# Patient Record
Sex: Male | Born: 1960 | Race: White | Hispanic: No | Marital: Married | Smoking: Current every day smoker
Health system: Southern US, Community
[De-identification: ages and names within clinical notes are randomized; demographics above are authoritative.]

## PROBLEM LIST (undated history)

## (undated) DIAGNOSIS — Z8739 Personal history of other diseases of the musculoskeletal system and connective tissue: Secondary | ICD-10-CM

## (undated) DIAGNOSIS — C801 Malignant (primary) neoplasm, unspecified: Secondary | ICD-10-CM

## (undated) DIAGNOSIS — T4145XA Adverse effect of unspecified anesthetic, initial encounter: Secondary | ICD-10-CM

## (undated) DIAGNOSIS — K219 Gastro-esophageal reflux disease without esophagitis: Secondary | ICD-10-CM

## (undated) DIAGNOSIS — F419 Anxiety disorder, unspecified: Secondary | ICD-10-CM

## (undated) DIAGNOSIS — R1011 Right upper quadrant pain: Secondary | ICD-10-CM

## (undated) DIAGNOSIS — E785 Hyperlipidemia, unspecified: Secondary | ICD-10-CM

## (undated) DIAGNOSIS — T8859XA Other complications of anesthesia, initial encounter: Secondary | ICD-10-CM

## (undated) DIAGNOSIS — J449 Chronic obstructive pulmonary disease, unspecified: Secondary | ICD-10-CM

## (undated) DIAGNOSIS — I10 Essential (primary) hypertension: Secondary | ICD-10-CM

## (undated) DIAGNOSIS — M1990 Unspecified osteoarthritis, unspecified site: Secondary | ICD-10-CM

## (undated) DIAGNOSIS — F32A Depression, unspecified: Secondary | ICD-10-CM

## (undated) DIAGNOSIS — F329 Major depressive disorder, single episode, unspecified: Secondary | ICD-10-CM

## (undated) HISTORY — DX: Essential (primary) hypertension: I10

## (undated) HISTORY — DX: Unspecified osteoarthritis, unspecified site: M19.90

## (undated) HISTORY — PX: ELBOW SURGERY: SHX618

## (undated) HISTORY — PX: EYE SURGERY: SHX253

## (undated) HISTORY — DX: Hyperlipidemia, unspecified: E78.5

## (undated) HISTORY — DX: Malignant (primary) neoplasm, unspecified: C80.1

## (undated) HISTORY — PX: HERNIA REPAIR: SHX51

---

## 1998-07-03 HISTORY — PX: NASAL SINUS SURGERY: SHX719

## 2003-05-18 ENCOUNTER — Encounter: Admission: RE | Admit: 2003-05-18 | Discharge: 2003-05-18 | Payer: Self-pay | Admitting: Internal Medicine

## 2003-06-02 ENCOUNTER — Ambulatory Visit (HOSPITAL_COMMUNITY): Admission: RE | Admit: 2003-06-02 | Discharge: 2003-06-02 | Payer: Self-pay | Admitting: Specialist

## 2003-08-27 ENCOUNTER — Encounter: Admission: RE | Admit: 2003-08-27 | Discharge: 2003-08-27 | Payer: Self-pay | Admitting: Internal Medicine

## 2004-04-05 ENCOUNTER — Ambulatory Visit (HOSPITAL_COMMUNITY): Admission: RE | Admit: 2004-04-05 | Discharge: 2004-04-05 | Payer: Self-pay | Admitting: *Deleted

## 2004-04-05 ENCOUNTER — Encounter (INDEPENDENT_AMBULATORY_CARE_PROVIDER_SITE_OTHER): Payer: Self-pay | Admitting: *Deleted

## 2004-06-21 ENCOUNTER — Encounter: Admission: RE | Admit: 2004-06-21 | Discharge: 2004-06-21 | Payer: Self-pay | Admitting: Internal Medicine

## 2005-01-02 ENCOUNTER — Encounter: Admission: RE | Admit: 2005-01-02 | Discharge: 2005-01-02 | Payer: Self-pay | Admitting: Internal Medicine

## 2006-09-10 ENCOUNTER — Encounter: Admission: RE | Admit: 2006-09-10 | Discharge: 2006-09-10 | Payer: Self-pay | Admitting: Neurology

## 2007-03-01 ENCOUNTER — Ambulatory Visit (HOSPITAL_COMMUNITY): Admission: RE | Admit: 2007-03-01 | Discharge: 2007-03-01 | Payer: Self-pay | Admitting: *Deleted

## 2007-08-19 ENCOUNTER — Encounter (INDEPENDENT_AMBULATORY_CARE_PROVIDER_SITE_OTHER): Payer: Self-pay | Admitting: *Deleted

## 2007-08-19 ENCOUNTER — Ambulatory Visit (HOSPITAL_COMMUNITY): Admission: RE | Admit: 2007-08-19 | Discharge: 2007-08-19 | Payer: Self-pay | Admitting: *Deleted

## 2010-03-23 ENCOUNTER — Ambulatory Visit (HOSPITAL_COMMUNITY): Admission: RE | Admit: 2010-03-23 | Discharge: 2010-03-23 | Payer: Self-pay | Admitting: General Surgery

## 2010-04-14 ENCOUNTER — Encounter: Admission: RE | Admit: 2010-04-14 | Discharge: 2010-04-14 | Payer: Self-pay | Admitting: General Surgery

## 2010-07-03 HISTORY — PX: SHOULDER SURGERY: SHX246

## 2010-09-15 LAB — COMPREHENSIVE METABOLIC PANEL
ALT: 38 U/L (ref 0–53)
AST: 28 U/L (ref 0–37)
Albumin: 4.4 g/dL (ref 3.5–5.2)
Alkaline Phosphatase: 48 U/L (ref 39–117)
BUN: 9 mg/dL (ref 6–23)
CO2: 27 mEq/L (ref 19–32)
Calcium: 9.6 mg/dL (ref 8.4–10.5)
Chloride: 105 mEq/L (ref 96–112)
Creatinine, Ser: 0.91 mg/dL (ref 0.4–1.5)
GFR calc Af Amer: >60 mL/min (ref >60)
GFR calc non Af Amer: >60 mL/min (ref >60)
Glucose, Bld: 134 mg/dL — ABNORMAL HIGH (ref 70–99)
Potassium: 4.5 mEq/L (ref 3.5–5.1)
Sodium: 140 mEq/L (ref 135–145)
Total Bilirubin: 0.4 mg/dL (ref 0.3–1.2)
Total Protein: 7.3 g/dL (ref 6.0–8.3)

## 2010-09-15 LAB — GLUCOSE, CAPILLARY
Glucose-Capillary: 156 mg/dL — ABNORMAL HIGH (ref 70–99)
Glucose-Capillary: 162 mg/dL — ABNORMAL HIGH (ref 70–99)

## 2010-09-15 LAB — SURGICAL PCR SCREEN
MRSA, PCR: NEGATIVE
Staphylococcus aureus: NEGATIVE

## 2010-11-15 NOTE — Op Note (Signed)
NAME:  Frank Baird, Frank Baird NO.:  [id number]   MEDICAL RECORD NO.:  [id number]          PATIENT TYPE:  AMB   LOCATION:  ENDO                         FACILITY:  [facility]   PHYSICIAN:  Georgiana Spinner, M.D.    DATE OF BIRTH:  Dec 07, 1960   DATE OF PROCEDURE:  03/01/2007  DATE OF DISCHARGE:                               OPERATIVE REPORT   ADDENDUM TO ENDOSCOPY:  A carbon copy to Dr. Rudi Heap in [facility].  Do not make this a separate dictation.  Just tack it to the end  of the endoscopy as if it were a seamless carbon copy.  Thank you.           ______________________________  Georgiana Spinner, M.D.     GMO/MEDQ  D:  03/01/2007  T:  03/02/2007  Job:  [id number]

## 2010-11-15 NOTE — Op Note (Signed)
NAME:  Frank Baird, Frank Baird NO.:  [id number]   MEDICAL RECORD NO.:  [id number]          PATIENT TYPE:  AMB   LOCATION:  ENDO                         FACILITY:  [facility]   PHYSICIAN:  Georgiana Spinner, M.D.    DATE OF BIRTH:  [birth year]   DATE OF PROCEDURE:  08/19/2007  DATE OF DISCHARGE:                               OPERATIVE REPORT   PROCEDURE:  Colonoscopy.   INDICATIONS:  Colon polyps.   ANESTHESIA:  Fentanyl 95 mcg, Versed 9.5 mg.   PROCEDURE:  With the patient mildly sedated in the left lateral  decubitus position, the rectal examination was attempted by me.  External hemorrhoid was noted.  Prostate felt normal to my limited exam.  Subsequently the Pentax videoscopic colonoscope was inserted in the  rectum, passed under direct vision to the cecum, identified by ileocecal  valve and appendiceal orifice both which were photographed.  From this  point the colonoscope was slowly withdrawn taking circumferential views  of colonic mucosa stopping at approximately 20 cm from anal verge at  which point a polyp was seen and photographed and removed using snare  cautery technique setting of 20/150 blended current. Polyp was were  retrieved by suctioning through the endoscope into a tissue trap.  The  endoscope was withdrawn to the rectum which appeared normal on direct  and retroflexed view.  The endoscope was straightened and withdrawn.  The patient's vital signs, pulse oximeter remained stable.  The patient  tolerated procedure well without apparent complication.   FINDINGS:  Small polyp at 20 cm from anal verge, otherwise unremarkable  examination.   PLAN:  Await biopsy report.  The patient will call me for results and  follow-up with me as needed as an outpatient.           ______________________________  Georgiana Spinner, M.D.     GMO/MEDQ  D:  08/19/2007  T:  08/20/2007  Job:  [id number]

## 2010-11-15 NOTE — Op Note (Signed)
NAME:  Frank Baird, Frank Baird NO.:  [id number]   MEDICAL RECORD NO.:  [id number]          PATIENT TYPE:  AMB   LOCATION:  ENDO                         FACILITY:  [facility]   PHYSICIAN:  Georgiana Spinner, M.D.    DATE OF BIRTH:  [birth year]   DATE OF PROCEDURE:  03/01/2007  DATE OF DISCHARGE:                               OPERATIVE REPORT   PROCEDURE:  Upper endoscopy.   INDICATIONS:  GERD.   ANESTHESIA:  Fentanyl 75 mcg, Versed 7 mg.   PROCEDURE:  With the patient mildly sedated in the left lateral  decubitus position, the Pentax videoscopic endoscope was inserted in the  mouth, passed under direct vision through the esophagus, which appeared,  normal into the stomach, which appeared normal, fundus, body, antrum.  Duodenal bulb was entered and it showed some changes of duodenitis,  fairly mild.  The second portion duodenum appeared normal.  From this  point the endoscope was slowly withdrawn taking circumferential views of  duodenal mucosa until the endoscope was then pulled back into the  stomach, placed in retroflexion to view the stomach from below.  The  endoscope was straightened and withdrawn taking circumferential views  remaining gastric and esophageal mucosa.  The patient's vital signs and  pulse oximetry remained stable.  The patient tolerated the procedure  well without apparent complications.   FINDINGS:  Mild duodenitis, otherwise an unremarkable examination.  No  evidence of Barrett's esophagus or esophagitis noted.   PLAN:  Have patient follow up with me on an as-needed basis.           ______________________________  Georgiana Spinner, M.D.     GMO/MEDQ  D:  03/01/2007  T:  03/02/2007  Job:  [id number]

## 2010-11-18 NOTE — Op Note (Signed)
NAME:  Frank Baird, Frank Baird NO.:  [id number]   MEDICAL RECORD NO.:  [id number]          PATIENT TYPE:  AMB   LOCATION:  ENDO                         FACILITY:  [facility]   PHYSICIAN:  Georgiana Spinner, M.D.    DATE OF BIRTH:  09/08/1960   DATE OF PROCEDURE:  DATE OF DISCHARGE:                                 OPERATIVE REPORT   PROCEDURE:  Colonoscopy.   SURGEON:   INDICATIONS FOR PROCEDURE:  Rectal bleeding.   ANESTHESIA:  Demerol 25 and Versed 2.5 mg.   DESCRIPTION OF PROCEDURE:  With the patient mildly sedated in the left  lateral decubitus position, the Olympus videoscopic colonoscope was inserted  in the rectum after a normal rectal examination, passed under direct vision  to the cecum identified by ileocecal valve and appendiceal orifice both of  which were photographed.  We entered into the terminal ileum which appeared  normal and was photographed.  From this point, the colonoscope was slowly  withdrawn taking circumferential views of the colonic mucosa stopping only  in the descending colon where polyp was seen, photographed and removed using  hot biopsy forceps technique at setting of 20/200 amount of current.  The  endoscope was withdrawn all the way to the rectum which appeared normal on  direct and retroflexed views showed hemorrhoids.  The endoscope was  withdrawn.  The patient's vital signs and pulse oximeter remained stable.  The patient tolerated the procedure well with no apparent complications.   FINDINGS:  Polyp in descending colon biopsied.  Await biopsy report.  The  patient will call me for results.  Follow-up will be as an outpatient to  determine ____________.       Rochele Raring  D:  04/05/2004  T:  04/05/2004  Job:  [id number]

## 2010-11-18 NOTE — Op Note (Signed)
NAME:  Frank Baird, Frank Baird NO.:  [id number]   MEDICAL RECORD NO.:  [id number]          PATIENT TYPE:  AMB   LOCATION:  ENDO                         FACILITY:  [facility]   PHYSICIAN:  Georgiana Spinner, M.D.    DATE OF BIRTH:  07-22-1960   DATE OF PROCEDURE:  DATE OF DISCHARGE:                                 OPERATIVE REPORT   PROCEDURE:  Upper endoscopy with biopsy.   SURGEON:   INDICATIONS FOR PROCEDURE:  GERD, rectal bleeding.   ANESTHESIA:  Demerol 75 mg and Versed 7.5 mg.   DESCRIPTION OF PROCEDURE:  With the patient mildly sedated in the left  lateral decubitus position, the Olympus videoscopic endoscope was inserted  in the mouth and passed under direct vision through the esophagus which  appeared normal until I reached distal esophagus and there was change of the  esophagitis with ulceration and erythema seen and there was also an area of  Barrett's esophagus photographed.  We then biopsied these areas as best as  possible via a lot of spasm in this area.  Therefore, we had difficulty  seeing the full attempt and he was refluxing bilious material up into the  esophagus but we biopsied what we could.  Advanced into the stomach.  Fundus, body, antrum, duodenal bulb and second portion of duodenum were  visualized.  From this point the endoscope was slowly withdrawn, taking  circumferential views of the left mucosa until the endoscope had been pulled  back into the stomach, placed in retroflexion to view the stomach from  below.  The endoscope was straightened and withdrawn taking circumferential  views of the remaining gastric and esophageal mucosa.  stopping at the  fundus and stomach where changes of erythema were seen and biopsied.  The  patient's vital signs and pulse oximeter remained stable.  The patient  tolerated the procedure well with no apparent complications.   FINDINGS:  Barrett's esophagus and esophagitis photographed and biopsied.  Changes in the  fundus probably H. pyloris gastritis, biopsied.   PLAN:  Await biopsy report.  The patient will call me for results of biopsy.  The patient to proceed to colonoscopy as planned.       GMO/MEDQ  D:  04/05/2004  T:  04/05/2004  Job:  [id number]

## 2010-11-24 ENCOUNTER — Other Ambulatory Visit: Payer: Self-pay | Admitting: Specialist

## 2010-11-24 ENCOUNTER — Encounter (HOSPITAL_COMMUNITY): Payer: 59

## 2010-11-24 LAB — BASIC METABOLIC PANEL
BUN: 11 mg/dL (ref 6–23)
CO2: 25 mEq/L (ref 19–32)
Calcium: 9.4 mg/dL (ref 8.4–10.5)
Chloride: 102 mEq/L (ref 96–112)
Creatinine, Ser: 0.78 mg/dL (ref 0.4–1.5)
GFR calc Af Amer: >60 mL/min (ref >60)
GFR calc non Af Amer: >60 mL/min (ref >60)
Glucose, Bld: 130 mg/dL — ABNORMAL HIGH (ref 70–99)
Potassium: 4 mEq/L (ref 3.5–5.1)
Sodium: 137 mEq/L (ref 135–145)

## 2010-11-24 LAB — CBC
HCT: 42.1 % (ref 39.0–52.0)
Hemoglobin: 14.1 g/dL (ref 13.0–17.0)
MCH: 29.7 pg (ref 26.0–34.0)
MCHC: 33.5 g/dL (ref 30.0–36.0)
MCV: 88.8 fL (ref 78.0–100.0)
Platelets: 187 10*3/uL (ref 150–400)
RBC: 4.74 MIL/uL (ref 4.22–5.81)
RDW: 13.8 % (ref 11.5–15.5)
WBC: 8.7 10*3/uL (ref 4.0–10.5)

## 2010-11-24 LAB — SURGICAL PCR SCREEN
MRSA, PCR: NEGATIVE
Staphylococcus aureus: NEGATIVE

## 2010-12-01 ENCOUNTER — Other Ambulatory Visit: Payer: Self-pay | Admitting: Specialist

## 2010-12-01 ENCOUNTER — Ambulatory Visit (HOSPITAL_COMMUNITY)
Admission: RE | Admit: 2010-12-01 | Discharge: 2010-12-01 | Disposition: A | Discharge status: Home / Self Care | Payer: 59 | Source: Ambulatory Visit | Attending: Specialist | Admitting: Specialist

## 2010-12-01 DIAGNOSIS — M719 Bursopathy, unspecified: Secondary | ICD-10-CM | POA: Insufficient documentation

## 2010-12-01 DIAGNOSIS — M67919 Unspecified disorder of synovium and tendon, unspecified shoulder: Secondary | ICD-10-CM | POA: Insufficient documentation

## 2010-12-01 DIAGNOSIS — Z01812 Encounter for preprocedural laboratory examination: Secondary | ICD-10-CM | POA: Insufficient documentation

## 2010-12-01 DIAGNOSIS — M24119 Other articular cartilage disorders, unspecified shoulder: Secondary | ICD-10-CM | POA: Insufficient documentation

## 2010-12-01 DIAGNOSIS — Z79899 Other long term (current) drug therapy: Secondary | ICD-10-CM | POA: Insufficient documentation

## 2010-12-01 DIAGNOSIS — M19019 Primary osteoarthritis, unspecified shoulder: Secondary | ICD-10-CM | POA: Insufficient documentation

## 2010-12-01 DIAGNOSIS — M25819 Other specified joint disorders, unspecified shoulder: Secondary | ICD-10-CM | POA: Insufficient documentation

## 2010-12-01 LAB — GLUCOSE, CAPILLARY
Glucose-Capillary: 163 mg/dL — ABNORMAL HIGH (ref 70–99)
Glucose-Capillary: 197 mg/dL — ABNORMAL HIGH (ref 70–99)
Glucose-Capillary: 157 mg/dL — ABNORMAL HIGH (ref 70–99)

## 2010-12-19 NOTE — Op Note (Signed)
NAME:  Frank Baird, Frank Baird               ACCOUNT NO.:  [id number]  MEDICAL RECORD NO.:  [id number]           PATIENT TYPE:  O  LOCATION:  DAYL                         FACILITY:  [facility]  PHYSICIAN:  Jene Every, M.D.    DATE OF BIRTH:  [birth year]  DATE OF PROCEDURE:  12/01/2010 DATE OF DISCHARGE:                              OPERATIVE REPORT   PREOPERATIVE DIAGNOSES:  Impingement syndrome, labral tear, rotator cuff tear, right shoulder.  POSTOPERATIVE DIAGNOSES: 1. Posterior, superior, anterior labral tear. 2. Impingement syndrome. 3. Small tear of the rotator cuff. 4. Acromioclavicular arthrosis.  PROCEDURE PERFORMED: 1. Exam under anesthesia. 2. Right shoulder arthroscopy with debridement of the anterior,     superior and posterior labrum. 3. Debridement of rotator cuff. 4. Subacromial decompression, acromioplasty, bursectomy. 5. Mini open distal clavicle resection.  ANESTHESIA:  General.  ASSISTANT:  Roma Schanz, P.A.BRIEF HISTORY:  50 year old with shoulder pain, refractory.  MRI indicated remnant tear of the rotator cuff, labral tear and moderate AC arthrosis.  He was exquisitely tender over the [facility], had a positive crossover maneuver.  Also had impingement sign of the shoulder.  He is here for shoulder arthroscopy, evaluation of the rotator cuff, distal clavicle resection, possible open rotator cuff repair.  Risks and benefits discussed, including bleeding, infection, suboptimal range of motion, recurrent tear, persistent pain, need for revision, DVT, PE, anesthetic complications, etc.  TECHNIQUE:  The patient in supine beach-chair position after induction of adequate general anesthesia and 1g of vancomycin.  The right shoulder and upper extremity was prepped and draped in usual sterile fashion. Examined him under anesthesia and found to have normal range of motion. Surgical marker was utilized only at acromion,  AC joint and coracoid. Posterolateral and  anterolateral incisions were made through the skin only with the arm in 70/30 position with gentle traction.  We did insert arthroscopic camera into the glenohumeral joint, penetrating the capsule atraumatically.  There was extensive fraying and tearing of the posterior labrum into the superior labrum and a portion of the anterior labrum.  Anteriorly, there was a Buford type complex.  There was some fraying of supraspinatus, minimal fraying of the undersurface of the supraspinatus rotator cuff.  Under direct visualization, I guided an 18- gauge needle into the capsule anteriorly just beneath the biceps tendon. A small incision in the skin advanced through cannula beneath the biceps tendon without difficulty.  I then introduced a shaver and debrided the labrum to a stable base both posteriorly, superiorly and anteriorly.  It was still attached and was not a full detachment of the labrum.  Some mild grade 3 changes of the central portion of the glenoid.  We shaved the undersurface of rotator cuff, some fraying of the subscap was noted as well as the small biceps tendon was intact and not subluxing out of the groove with probing.  Next, redirected camera in the subacromial space and moved the anterior portal to the anterolateral portal, triangulating.  Hypertrophic bursa was noted in the subacromial space. We had performed a full bursectomy.  Then, we released CA ligament, morselized that with an ArthroWand.  A small spur  on the anterior and the anterolateral aspect acromion was removed as well performing acromioplasty.  Next, attention turned towards the rotator cuff.  Full inspection revealed no evidence of tearing and was well vascularized. Hyperemic beneath the anterolateral aspect of the acromion with acromioplasty was performed.  An extensive probing revealed no significant tearing, perhaps in concert with the undersurface of 50% to 15%.  Next, we turned attention towards the clavicle.   We skeletonized the distal centimeter of the clavicle, removing the capsule with the ArthroWand both inferiorly, anteriorly and posteriorly.  Unable to deliver the clavicle into the subacromial space, however.  Therefore, converted to a mini open procedure.  All instrumentation was removed and the portals were closed with 4-0 nylon simple sutures.  Small 2-cm incision was made over the clavicle.  Subcutaneous tissue was dissected. Electrocautery was utilized to achieve hemostasis.  We split the deltotrapezial fascia as well as the capsule.  Both were preserved, again skeletonizing the distal clavicle approximately 1 cm of that.  We protected the rotator cuff inferiorly, anteriorly and posteriorly with Homan's  using an oscillating saw to remove a centimeter of the distal clavicle.  We then undercut the clavicle with 3-mm Kerrison to remove any inferiorly projecting spurs on the rotator cuff.  Following this, there was excellent space between the clavicle and the acromion.  The rotator cuff was unremarkable.  The coracoclavicular ligaments were preserved.  The __________ was then copiously irrigated.  Bone wax was placed over the distal clavicle, repaired the capsule with 0 Vicryl interrupted figure-of-8 sutures as well as the deltotrapezial fascia and subcu tissue with 2-0 Vicryl simple sutures.  Skin was reapproximated with 4-0 subcuticular Prolene, 0.25% Marcaine with epinephrine was infiltrated in the [facility] joint as well as the subacromial space.  Sterile dressing was applied.  He was placed in a sling, extubated without difficulty and transported to the recovery room in satisfactory condition.  The patient tolerated the procedure well.  No complications.  Assistant was [employer].  We had minimal blood loss.     Jene Every, M.D.     Cordelia Pen  D:  12/01/2010  T:  12/01/2010  Job:  [id number]  Electronically Signed by Jene Every M.D. on 12/19/2010 03:39:49 PM

## 2011-08-28 ENCOUNTER — Other Ambulatory Visit: Payer: Self-pay | Admitting: Physician Assistant

## 2011-08-28 ENCOUNTER — Ambulatory Visit
Admission: RE | Admit: 2011-08-28 | Discharge: 2011-08-28 | Disposition: A | Discharge status: Home / Self Care | Payer: Federal, State, Local not specified - PPO | Source: Ambulatory Visit | Attending: Physician Assistant | Admitting: Physician Assistant

## 2011-08-28 DIAGNOSIS — R1011 Right upper quadrant pain: Secondary | ICD-10-CM

## 2011-08-30 ENCOUNTER — Other Ambulatory Visit: Payer: Self-pay | Admitting: Physician Assistant

## 2011-08-30 DIAGNOSIS — R110 Nausea: Secondary | ICD-10-CM

## 2011-08-30 DIAGNOSIS — R1011 Right upper quadrant pain: Secondary | ICD-10-CM

## 2011-09-05 ENCOUNTER — Encounter (HOSPITAL_COMMUNITY): Payer: Self-pay

## 2011-09-05 ENCOUNTER — Encounter (HOSPITAL_COMMUNITY)
Admission: RE | Admit: 2011-09-05 | Discharge: 2011-09-05 | Disposition: A | Discharge status: Home / Self Care | Payer: Federal, State, Local not specified - PPO | Source: Ambulatory Visit | Attending: Physician Assistant | Admitting: Physician Assistant

## 2011-09-05 DIAGNOSIS — R1011 Right upper quadrant pain: Secondary | ICD-10-CM

## 2011-09-05 DIAGNOSIS — R110 Nausea: Secondary | ICD-10-CM | POA: Insufficient documentation

## 2011-09-05 HISTORY — DX: Right upper quadrant pain: R10.11

## 2011-09-05 MED ORDER — SINCALIDE 5 MCG IJ SOLR: 0.0200 ug/kg | Freq: Once | INTRAMUSCULAR | Status: DC

## 2011-09-05 MED ORDER — TECHNETIUM TC 99M MEBROFENIN IV KIT
5.5000 | PACK | Freq: Once | INTRAVENOUS | Status: AC | PRN
  Administered 2011-09-05: 5.5 via INTRAVENOUS

## 2011-09-06 ENCOUNTER — Other Ambulatory Visit: Payer: Self-pay | Admitting: Physician Assistant

## 2011-09-06 ENCOUNTER — Ambulatory Visit
Admission: RE | Admit: 2011-09-06 | Discharge: 2011-09-06 | Disposition: A | Discharge status: Home / Self Care | Payer: Federal, State, Local not specified - PPO | Source: Ambulatory Visit | Attending: Physician Assistant | Admitting: Physician Assistant

## 2011-09-06 MED ORDER — IOHEXOL 300 MG/ML  SOLN
125.0000 mL | Freq: Once | INTRAMUSCULAR | Status: AC | PRN
  Administered 2011-09-06: 125 mL via INTRAVENOUS

## 2011-09-06 MED ORDER — IOHEXOL 300 MG/ML  SOLN
20.0000 mL | Freq: Once | INTRAMUSCULAR | Status: AC | PRN
  Administered 2011-09-06: 20 mL via ORAL

## 2011-09-22 ENCOUNTER — Encounter (INDEPENDENT_AMBULATORY_CARE_PROVIDER_SITE_OTHER): Payer: Self-pay

## 2011-09-22 ENCOUNTER — Encounter (HOSPITAL_COMMUNITY): Payer: Self-pay | Admitting: Pharmacy Technician

## 2011-09-22 ENCOUNTER — Ambulatory Visit (INDEPENDENT_AMBULATORY_CARE_PROVIDER_SITE_OTHER): Payer: Federal, State, Local not specified - PPO | Admitting: General Surgery

## 2011-09-22 ENCOUNTER — Encounter (INDEPENDENT_AMBULATORY_CARE_PROVIDER_SITE_OTHER): Payer: Self-pay | Admitting: General Surgery

## 2011-09-22 VITALS — BP 154/86 | HR 64 | Temp 96.8°F | Resp 16 | Ht 69.0 in | Wt 244.5 lb

## 2011-09-22 DIAGNOSIS — R1011 Right upper quadrant pain: Secondary | ICD-10-CM

## 2011-09-22 NOTE — Pre-Procedure Instructions (Addendum)
20 GIRARD KOONTZ  09/22/2011   Your procedure is scheduled on: Tues, Mar 26 @ 1000  Report to [facility] at 0800 AM.  Call this number if you have problems the morning of surgery: [phone]   Remember:   Do not eat food:After Midnight.  May have clear liquids: up to 4 Hours before arrival.(until 4:00 am)  Clear liquids include soda, tea, black coffee, apple or grape juice, broth,water  Take these medicines the morning of surgery with A SIP OF WATER: Nexium and Pain Pill(if needed)   Do not wear jewelry  Do not wear lotions, powders, or perfumes.   Do not bring valuables to the hospital.  Contacts, dentures or bridgework may not be worn into surgery.  Leave suitcase in the car. After surgery it may be brought to your room.  For patients admitted to the hospital, checkout time is 11:00 AM the day of discharge.   Patients discharged the day of surgery will not be allowed to drive home. Driver will be wife, Best boy.   Special Instructions: CHG Shower Use Special Wash: 1/2 bottle night before surgery and 1/2 bottle morning of surgery.   Please read over the following fact sheets that you were given: Pain Booklet, Coughing and Deep Breathing, MRSA Information and Surgical Site Infection Prevention

## 2011-09-22 NOTE — Progress Notes (Signed)
Subjective:   Right upper quadrant abdominal pain  Patient ID: Frank Baird, male   DOB: 09/08/1960, 51 y.o.   MRN: [id number]  HPI The patient comes to the office referred by Dr. Hung for recent onset of significant right upper quadrant abdominal pain. The patient denies any previous history of similar pain. He has been followed for chronic reflux and history of Barrett's esophagus but these symptoms have been very well controlled on Nexium. He recently underwent right shoulder surgery and as he was recovering from this he developed the onset of intermittent right upper quadrant abdominal pain. He describes sharp stabbing pain just under his right rib cage. This is definitely related to eating. He has some nausea but no vomiting. This started about 4 weeks ago. The pain is steadily worsening to where it is now daily and almost constant and not tolerable. He has had some intermittent constipation and then diarrhea. No blood in his stools. He had a colonoscopy in 2005 with a benign polyp removed from his descending colon. His last upper endoscopy was 2005. No fever or chills. No jaundice. To date he has had a workup including a CT scan of the abdomen and abdominal ultrasound both of which were completely negative. He had a HIDA scan that showed normal ejection fraction of his gallbladder at 94% but he had severe right upper quadrant pain with CCK injection. He has seen Dr. Hung for evaluation we felt that the symptoms were so suggestive of gallbladder disease that he would not necessarily recommend endoscopy at this point. Past Medical History  Diagnosis Date  . RUQ pain   . Arthritis   . Diabetes mellitus   . Hyperlipidemia   . Hypertension    Past Surgical History  Procedure Date  . Shoulder surgery 2012    right shoulder rotator cuff  . Nasal sinus surgery 2000  . Hernia repair     umbilical and right inguinal  . Elbow surgery     right elbow ligament   Current Outpatient Prescriptions    Medication Sig Dispense Refill  . CRESTOR 10 MG tablet       . HYDROcodone-acetaminophen (NORCO) 7.5-325 MG per tablet       . IBUPROFEN PO Take by mouth.      . lisinopril (PRINIVIL,ZESTRIL) 20 MG tablet       . NEXIUM 40 MG capsule       . pioglitazone (ACTOS) 30 MG tablet        Allergies  Allergen Reactions  . Codeine     Bad stomach cramps    . Penicillins    History  Substance Use Topics  . Smoking status: Current Everyday Smoker -- 0.5 packs/day  . Smokeless tobacco: Never Used  . Alcohol Use: Yes    Review of Systems  Constitutional: Negative for fever and chills.  Respiratory: Negative for cough, shortness of breath and wheezing.   Cardiovascular: Positive for leg swelling. Negative for chest pain and palpitations.  Gastrointestinal: Positive for nausea, abdominal pain, diarrhea and constipation. Negative for vomiting.  Genitourinary: Negative for dysuria, urgency and difficulty urinating.  Musculoskeletal: Positive for myalgias and arthralgias.       Objective:   Physical Exam General: Alert, mildly obese Caucasian male, in no distress Skin: Warm and dry without rash or infection. HEENT: No palpable masses or thyromegaly. Sclera nonicteric. Pupils equal round and reactive. Oropharynx clear. Lymph nodes: No cervical, supraclavicular, or inguinal nodes palpable. Lungs: Breath sounds clear and equal without   increased work of breathing Cardiovascular: Regular rate and rhythm without murmur. No JVD or edema. Peripheral pulses intact. Abdomen: Nondistended. Soft.  There is moderate tenderness in the RUQ without guarding. No masses palpable. No organomegaly. No palpable hernias. Extremities: No edema or joint swelling or deformity. No chronic venous stasis changes. Neurologic: Alert and fully oriented. Gait normal.     Assessment:     Several weeks of persistent and severe postprandial right upper quadrant abdominal pain very suggestive of biliary source. He has a  thorough negative workup. I suspect he may well have biliary dyskinesia/chronic cholecystitis. I discussed this diagnosis at length with the patient. He has not had endoscopy but I would agree with Dr. Hung that with his symptom complex this would be low yield. I discussed options with the patient including further workup with endoscopy versus proceeding with cholecystectomy in an effort to relieve his symptoms. He understands that the diagnosis is not completely clear and that he may not benefit from the surgery. He understands and strongly desires to proceed as soon as possible. We discussed the nature of the surgery, indications, risks of bleeding, infection, bile duct injury, he was given complete literature regarding the surgery. We'll schedule this for next week.    Plan:     Laparoscopic cholecystemy with cholangiogram under general anesthesia as an outpatient. Noted is that he has had an umbilical hernia repair with mesh which may af placement.      

## 2011-09-25 ENCOUNTER — Ambulatory Visit (HOSPITAL_COMMUNITY)
Admission: RE | Admit: 2011-09-25 | Discharge: 2011-09-25 | Disposition: A | Discharge status: Home / Self Care | Payer: Federal, State, Local not specified - PPO | Source: Ambulatory Visit | Attending: Anesthesiology | Admitting: Anesthesiology

## 2011-09-25 ENCOUNTER — Encounter (HOSPITAL_COMMUNITY)
Admission: RE | Admit: 2011-09-25 | Discharge: 2011-09-25 | Disposition: A | Discharge status: Home / Self Care | Payer: Federal, State, Local not specified - PPO | Source: Ambulatory Visit | Attending: General Surgery | Admitting: General Surgery

## 2011-09-25 ENCOUNTER — Encounter (HOSPITAL_COMMUNITY): Payer: Self-pay

## 2011-09-25 ENCOUNTER — Other Ambulatory Visit: Payer: Self-pay

## 2011-09-25 HISTORY — DX: Gastro-esophageal reflux disease without esophagitis: K21.9

## 2011-09-25 HISTORY — DX: Adverse effect of unspecified anesthetic, initial encounter: T41.45XA

## 2011-09-25 HISTORY — DX: Other complications of anesthesia, initial encounter: T88.59XA

## 2011-09-25 LAB — BASIC METABOLIC PANEL
BUN: 14 mg/dL (ref 6–23)
CO2: 24 mEq/L (ref 19–32)
Chloride: 103 mEq/L (ref 96–112)
Creatinine, Ser: 0.78 mg/dL (ref 0.50–1.35)
GFR calc Af Amer: >90 mL/min (ref >90)
Glucose, Bld: 200 mg/dL — ABNORMAL HIGH (ref 70–99)
Potassium: 4 mEq/L (ref 3.5–5.1)

## 2011-09-25 LAB — CBC
HCT: 42.9 % (ref 39.0–52.0)
Hemoglobin: 14.7 g/dL (ref 13.0–17.0)
MCHC: 34.3 g/dL (ref 30.0–36.0)
MCV: 86.8 fL (ref 78.0–100.0)
RDW: 12.7 % (ref 11.5–15.5)

## 2011-09-25 LAB — SURGICAL PCR SCREEN
MRSA, PCR: NEGATIVE
Staphylococcus aureus: NEGATIVE

## 2011-09-25 MED ORDER — CIPROFLOXACIN IN D5W 400 MG/200ML IV SOLN
400.0000 mg | INTRAVENOUS | Status: AC
  Administered 2011-09-26: 400 mg via INTRAVENOUS
  Filled 2011-09-25: qty 200

## 2011-09-25 MED ORDER — CHLORHEXIDINE GLUCONATE 4 % EX LIQD: 1.0000 "application " | Freq: Once | CUTANEOUS | Status: DC

## 2011-09-26 ENCOUNTER — Ambulatory Visit (HOSPITAL_COMMUNITY): Payer: Federal, State, Local not specified - PPO | Admitting: Anesthesiology

## 2011-09-26 ENCOUNTER — Ambulatory Visit (HOSPITAL_COMMUNITY): Payer: Federal, State, Local not specified - PPO

## 2011-09-26 ENCOUNTER — Encounter (HOSPITAL_COMMUNITY): Payer: Self-pay | Admitting: Anesthesiology

## 2011-09-26 ENCOUNTER — Encounter (HOSPITAL_COMMUNITY)
Admission: RE | Disposition: A | Discharge status: Home / Self Care | Payer: Self-pay | Source: Ambulatory Visit | Attending: General Surgery

## 2011-09-26 ENCOUNTER — Encounter (HOSPITAL_COMMUNITY): Payer: Self-pay | Admitting: *Deleted

## 2011-09-26 ENCOUNTER — Ambulatory Visit (HOSPITAL_COMMUNITY)
Admission: RE | Admit: 2011-09-26 | Discharge: 2011-09-26 | Disposition: A | Discharge status: Home / Self Care | Payer: Federal, State, Local not specified - PPO | Source: Ambulatory Visit | Attending: General Surgery | Admitting: General Surgery

## 2011-09-26 DIAGNOSIS — Z01812 Encounter for preprocedural laboratory examination: Secondary | ICD-10-CM | POA: Insufficient documentation

## 2011-09-26 DIAGNOSIS — Z01810 Encounter for preprocedural cardiovascular examination: Secondary | ICD-10-CM | POA: Insufficient documentation

## 2011-09-26 DIAGNOSIS — Z01818 Encounter for other preprocedural examination: Secondary | ICD-10-CM | POA: Insufficient documentation

## 2011-09-26 DIAGNOSIS — K811 Chronic cholecystitis: Secondary | ICD-10-CM | POA: Insufficient documentation

## 2011-09-26 DIAGNOSIS — F17200 Nicotine dependence, unspecified, uncomplicated: Secondary | ICD-10-CM | POA: Insufficient documentation

## 2011-09-26 DIAGNOSIS — K828 Other specified diseases of gallbladder: Secondary | ICD-10-CM

## 2011-09-26 DIAGNOSIS — E785 Hyperlipidemia, unspecified: Secondary | ICD-10-CM | POA: Insufficient documentation

## 2011-09-26 DIAGNOSIS — I10 Essential (primary) hypertension: Secondary | ICD-10-CM | POA: Insufficient documentation

## 2011-09-26 DIAGNOSIS — E119 Type 2 diabetes mellitus without complications: Secondary | ICD-10-CM | POA: Insufficient documentation

## 2011-09-26 HISTORY — PX: CHOLECYSTECTOMY: SHX55

## 2011-09-26 LAB — GLUCOSE, CAPILLARY: Glucose-Capillary: 182 mg/dL — ABNORMAL HIGH (ref 70–99)

## 2011-09-26 SURGERY — LAPAROSCOPIC CHOLECYSTECTOMY WITH INTRAOPERATIVE CHOLANGIOGRAM
Anesthesia: General | Wound class: Clean Contaminated

## 2011-09-26 MED ORDER — LACTATED RINGERS IV SOLN
INTRAVENOUS | Status: DC | PRN
  Administered 2011-09-26 (×2): via INTRAVENOUS

## 2011-09-26 MED ORDER — GLYCOPYRROLATE 0.2 MG/ML IJ SOLN
INTRAMUSCULAR | Status: DC | PRN
  Administered 2011-09-26: 0.2 mg via INTRAVENOUS

## 2011-09-26 MED ORDER — LIDOCAINE HCL (CARDIAC) 20 MG/ML IV SOLN
INTRAVENOUS | Status: DC | PRN
  Administered 2011-09-26: 100 mg via INTRAVENOUS

## 2011-09-26 MED ORDER — IOHEXOL 300 MG/ML  SOLN
INTRAMUSCULAR | Status: DC | PRN
  Administered 2011-09-26: 16 mL

## 2011-09-26 MED ORDER — NEOSTIGMINE METHYLSULFATE 1 MG/ML IJ SOLN
INTRAMUSCULAR | Status: DC | PRN
  Administered 2011-09-26: 3 mg via INTRAVENOUS

## 2011-09-26 MED ORDER — BUPIVACAINE-EPINEPHRINE 0.5% -1:200000 IJ SOLN
INTRAMUSCULAR | Status: DC | PRN
  Administered 2011-09-26: 30 mL

## 2011-09-26 MED ORDER — OXYCODONE-ACETAMINOPHEN 10-325 MG PO TABS: 1.0000 | ORAL_TABLET | ORAL | Status: AC | PRN

## 2011-09-26 MED ORDER — MIDAZOLAM HCL 5 MG/5ML IJ SOLN
INTRAMUSCULAR | Status: DC | PRN
  Administered 2011-09-26: 2 mg via INTRAVENOUS

## 2011-09-26 MED ORDER — LACTATED RINGERS IV SOLN
INTRAVENOUS | Status: DC
  Administered 2011-09-26: 09:00:00 via INTRAVENOUS

## 2011-09-26 MED ORDER — ROCURONIUM BROMIDE 100 MG/10ML IV SOLN
INTRAVENOUS | Status: DC | PRN
  Administered 2011-09-26: 50 mg via INTRAVENOUS
  Administered 2011-09-26: 10 mg via INTRAVENOUS

## 2011-09-26 MED ORDER — FENTANYL CITRATE 0.05 MG/ML IJ SOLN
INTRAMUSCULAR | Status: DC | PRN
  Administered 2011-09-26: 150 ug via INTRAVENOUS
  Administered 2011-09-26 (×2): 50 ug via INTRAVENOUS

## 2011-09-26 MED ORDER — PROPOFOL 10 MG/ML IV EMUL
INTRAVENOUS | Status: DC | PRN
  Administered 2011-09-26: 200 mg via INTRAVENOUS

## 2011-09-26 MED ORDER — 0.9 % SODIUM CHLORIDE (POUR BTL) OPTIME
TOPICAL | Status: DC | PRN
  Administered 2011-09-26: 1000 mL

## 2011-09-26 MED ORDER — HYDROMORPHONE HCL PF 1 MG/ML IJ SOLN
INTRAMUSCULAR | Status: AC
  Filled 2011-09-26: qty 1

## 2011-09-26 MED ORDER — MORPHINE SULFATE 2 MG/ML IJ SOLN: 0.0500 mg/kg | INTRAMUSCULAR | Status: DC | PRN

## 2011-09-26 MED ORDER — SODIUM CHLORIDE 0.9 % IR SOLN
Status: DC | PRN
  Administered 2011-09-26: 1000 mL

## 2011-09-26 MED ORDER — ONDANSETRON HCL 4 MG/2ML IJ SOLN: 4.0000 mg | Freq: Once | INTRAMUSCULAR | Status: DC | PRN

## 2011-09-26 MED ORDER — ONDANSETRON HCL 4 MG/2ML IJ SOLN
INTRAMUSCULAR | Status: DC | PRN
  Administered 2011-09-26: 4 mg via INTRAVENOUS

## 2011-09-26 MED ORDER — HYDROMORPHONE HCL PF 1 MG/ML IJ SOLN
0.2500 mg | INTRAMUSCULAR | Status: DC | PRN
  Administered 2011-09-26 (×3): 0.5 mg via INTRAVENOUS

## 2011-09-26 SURGICAL SUPPLY — 50 items
ADH SKN CLS APL DERMABOND .7 (GAUZE/BANDAGES/DRESSINGS) ×1
APPLIER CLIP ROT 10 11.4 M/L (STAPLE) ×2
APR CLP MED LRG 11.4X10 (STAPLE) ×1
BAG SPEC RTRVL LRG 6X4 10 (ENDOMECHANICALS) ×1
BLADE SURG ROTATE 9660 (MISCELLANEOUS) ×1 IMPLANT
CANISTER SUCTION 2500CC (MISCELLANEOUS) ×2 IMPLANT
CHLORAPREP W/TINT 26ML (MISCELLANEOUS) ×2 IMPLANT
CLIP APPLIE ROT 10 11.4 M/L (STAPLE) ×1 IMPLANT
CLOTH BEACON ORANGE TIMEOUT ST (SAFETY) ×2 IMPLANT
COVER MAYO STAND STRL (DRAPES) ×2 IMPLANT
COVER SURGICAL LIGHT HANDLE (MISCELLANEOUS) ×2 IMPLANT
DECANTER SPIKE VIAL GLASS SM (MISCELLANEOUS) ×4 IMPLANT
DERMABOND ADVANCED (GAUZE/BANDAGES/DRESSINGS) ×1
DERMABOND ADVANCED .7 DNX12 (GAUZE/BANDAGES/DRESSINGS) ×1 IMPLANT
DRAPE C-ARM 42X72 X-RAY (DRAPES) ×2 IMPLANT
ELECT REM PT RETURN 9FT ADLT (ELECTROSURGICAL) ×2
ELECTRODE REM PT RTRN 9FT ADLT (ELECTROSURGICAL) ×1 IMPLANT
GLOVE BIO SURGEON STRL SZ7.5 (GLOVE) ×1 IMPLANT
GLOVE BIOGEL PI IND STRL 7.0 (GLOVE) IMPLANT
GLOVE BIOGEL PI IND STRL 7.5 (GLOVE) IMPLANT
GLOVE BIOGEL PI IND STRL 8 (GLOVE) ×1 IMPLANT
GLOVE BIOGEL PI INDICATOR 7.0 (GLOVE) ×1
GLOVE BIOGEL PI INDICATOR 7.5 (GLOVE) ×1
GLOVE BIOGEL PI INDICATOR 8 (GLOVE) ×1
GLOVE EXAM NITRILE MICROT MD (GLOVE) ×1 IMPLANT
GLOVE SS BIOGEL STRL SZ 6.5 (GLOVE) ×1 IMPLANT
GLOVE SS BIOGEL STRL SZ 7.5 (GLOVE) ×1 IMPLANT
GLOVE SUPERSENSE BIOGEL SZ 6.5 (GLOVE) ×1
GLOVE SUPERSENSE BIOGEL SZ 7.5 (GLOVE) ×1
GOWN STRL NON-REIN LRG LVL3 (GOWN DISPOSABLE) ×4 IMPLANT
GOWN STRL REIN XL XLG (GOWN DISPOSABLE) ×2 IMPLANT
KIT BASIN OR (CUSTOM PROCEDURE TRAY) ×2 IMPLANT
KIT ROOM TURNOVER OR (KITS) ×2 IMPLANT
NS IRRIG 1000ML POUR BTL (IV SOLUTION) ×2 IMPLANT
PAD ARMBOARD 7.5X6 YLW CONV (MISCELLANEOUS) ×4 IMPLANT
POUCH SPECIMEN RETRIEVAL 10MM (ENDOMECHANICALS) ×1 IMPLANT
SCISSORS LAP 5X35 DISP (ENDOMECHANICALS) IMPLANT
SET CHOLANGIOGRAPH 5 50 .035 (SET/KITS/TRAYS/PACK) ×2 IMPLANT
SET IRRIG TUBING LAPAROSCOPIC (IRRIGATION / IRRIGATOR) ×2 IMPLANT
SLEEVE ADV FIXATION 5X100MM (TROCAR) ×1 IMPLANT
SLEEVE ENDOPATH XCEL 5M (ENDOMECHANICALS) ×1 IMPLANT
SPECIMEN JAR SMALL (MISCELLANEOUS) ×2 IMPLANT
SUT MON AB 5-0 PS2 18 (SUTURE) ×2 IMPLANT
TOWEL OR 17X24 6PK STRL BLUE (TOWEL DISPOSABLE) ×2 IMPLANT
TOWEL OR 17X26 10 PK STRL BLUE (TOWEL DISPOSABLE) ×2 IMPLANT
TRAY LAPAROSCOPIC (CUSTOM PROCEDURE TRAY) ×2 IMPLANT
TROCAR XCEL BLUNT TIP 100MML (ENDOMECHANICALS) ×2 IMPLANT
TROCAR Z-THREAD FIOS 11X100 BL (TROCAR) ×2 IMPLANT
TROCAR Z-THREAD FIOS 5X100MM (TROCAR) ×2 IMPLANT
WATER STERILE IRR 1000ML POUR (IV SOLUTION) IMPLANT

## 2011-09-26 NOTE — Anesthesia Preprocedure Evaluation (Signed)
Anesthesia Evaluation  Patient identified by MRN, date of birth, ID band Patient awake    Reviewed: Allergy & Precautions, H&P , NPO status , Patient's Chart, lab work & pertinent test results  Airway Mallampati: I TM Distance: >3 FB Neck ROM: Full    Dental  (+) Teeth Intact, Poor Dentition and Dental Advisory Given   Pulmonary  breath sounds clear to auscultation        Cardiovascular Rhythm:Regular Rate:Normal     Neuro/Psych    GI/Hepatic   Endo/Other  Diabetes mellitus-, Poorly Controlled, Type 2, Oral Hypoglycemic Agents  Renal/GU      Musculoskeletal   Abdominal   Peds  Hematology   Anesthesia Other Findings   Reproductive/Obstetrics                           Anesthesia Physical Anesthesia Plan  ASA: III  Anesthesia Plan: General   Post-op Pain Management:    Induction: Intravenous  Airway Management Planned: Oral ETT  Additional Equipment:   Intra-op Plan:   Post-operative Plan: Extubation in OR  Informed Consent: I have reviewed the patients History and Physical, chart, labs and discussed the procedure including the risks, benefits and alternatives for the proposed anesthesia with the patient or authorized representative who has indicated his/her understanding and acceptance.   Dental advisory given  Plan Discussed with: CRNA, Anesthesiologist and Surgeon  Anesthesia Plan Comments:         Anesthesia Quick Evaluation

## 2011-09-26 NOTE — Discharge Instructions (Signed)
[facility] ______[facility] °LAPAROSCOPIC SURGERY: POST OP INSTRUCTIONS °Always review your discharge instruction sheet given to you by the facility where your surgery was performed. °IF YOU HAVE DISABILITY OR FAMILY LEAVE FORMS, YOU MUST BRING THEM TO THE OFFICE FOR PROCESSING.   °DO NOT GIVE THEM TO YOUR DOCTOR. ° °1. A prescription for pain medication may be given to you upon discharge.  Take your pain medication as prescribed, if needed.  If narcotic pain medicine is not needed, then you may take acetaminophen (Tylenol) or ibuprofen (Advil) as needed. °2. Take your usually prescribed medications unless otherwise directed. °3. If you need a refill on your pain medication, please contact your pharmacy.  They will contact our office to request authorization. Prescriptions will not be filled after 5pm or on week-ends. °4. You should follow a light diet the first few days after arrival home, such as soup and crackers, etc.  Be sure to include lots of fluids daily. °5. Most patients will experience some swelling and bruising in the area of the incisions.  Ice packs will help.  Swelling and bruising can take several days to resolve.  °6. It is common to experience some constipation if taking pain medication after surgery.  Increasing fluid intake and taking a stool softener (such as Colace) will usually help or prevent this problem from occurring.  A mild laxative (Milk of Magnesia or Miralax) should be taken according to package instructions if there are no bowel movements after 48 hours. °7. Unless discharge instructions indicate otherwise, you may remove your bandages 24-48 hours after surgery, and you may shower at that time.  You may have steri-strips (small skin tapes) in place directly over the incision.  These strips should be left on the skin for 7-10 days.  If your surgeon used skin glue on the incision, you may shower in 24 hours.  The glue will flake off over the next 2-3 weeks.  Any sutures or  staples will be removed at the office during your follow-up visit. °8. ACTIVITIES:  You may resume regular (light) daily activities beginning the next day--such as daily self-care, walking, climbing stairs--gradually increasing activities as tolerated.  You may have sexual intercourse when it is comfortable.  Refrain from any heavy lifting or straining until approved by your doctor. °a. You may drive when you are no longer taking prescription pain medication, you can comfortably wear a seatbelt, and you can safely maneuver your car and apply brakes. °b. RETURN TO WORK:  __________________________________________________________ °9. You should see your doctor in the office for a follow-up appointment approximately 2-3 weeks after your surgery.  Make sure that you call for this appointment within a day or two after you arrive home to insure a convenient appointment time. °10. OTHER INSTRUCTIONS: __________________________________________________________________________________________________________________________ __________________________________________________________________________________________________________________________ °WHEN TO CALL YOUR DOCTOR: °1. Fever over 101.0 °2. Inability to urinate °3. Continued bleeding from incision. °4. Increased pain, redness, or drainage from the incision. °5. Increasing abdominal pain ° °The clinic staff is available to answer your questions during regular business hours.  Please don’t hesitate to call and ask to speak to one of the nurses for clinical concerns.  If you have a medical emergency, go to the nearest emergency room or call 911.  A surgeon from [facility] is always on call at the hospital. °[address], [address], [place], [place]  [address] ? [address] [address], , [place]   [address] °[phone] ? [phone] ? FAX [phone] °Web site:   [email or web address] °

## 2011-09-26 NOTE — Preoperative (Signed)
Beta Blockers   Reason not to administer Beta Blockers:Not Applicable 

## 2011-09-26 NOTE — Anesthesia Postprocedure Evaluation (Signed)
  Anesthesia Post-op Note  Patient: Frank Baird  Procedure(s) Performed: Procedure(s) (LRB): LAPAROSCOPIC CHOLECYSTECTOMY WITH INTRAOPERATIVE CHOLANGIOGRAM (N/A)  Patient Location: PACU  Anesthesia Type: General  Level of Consciousness: awake, alert  and oriented  Airway and Oxygen Therapy: Patient Spontanous Breathing and Patient connected to nasal cannula oxygen  Post-op Pain: mild  Post-op Assessment: Post-op Vital signs reviewed, Patient's Cardiovascular Status Stable, Respiratory Function Stable, Patent Airway, No signs of Nausea or vomiting and Pain level controlled  Post-op Vital Signs: Reviewed and stable  Complications: No apparent anesthesia complications

## 2011-09-26 NOTE — Op Note (Signed)
Preoperative diagnosis: Biliary dyskinesia/ chronic cholecystitis  Postoperative diagnosis: Same  Surgical procedure: Laparoscopic cholecystectomy with intraoperative cholangiogram  Surgeon: Sharlet Salina T. Leo Fray M.D.  Assistant: None  Anesthesia: General Endotracheal  Complications: None  Estimated blood loss: Minimal  Description of procedure: The patient brought to the operating room, placed in the supine position on the operating table, and general endotracheal anesthesia induced. The abdomen was widely sterilely prepped and draped. The patient had received preoperative IV antibiotics and PAS were in place. Patient timeout was performed the correct procedure verified. Standard 4 port technique was used with optical entry with a 5mm port in the RUQ due to previous UH repair, two further 5mm ports and a 12 mm port in the epigastrium all under direct vision. Omental adhesions were taken down sharply from around the umbilicus The gallbladder was visualized. It appeared normal but there were some omental adhesions to the GB.Marland Kitchen The fundus was grasped and elevated up over the liver and the infundibulum retracted inferiolaterally. Peritoneum anterior and posterior to close triangle was incised and fibrofatty tissue stripped off the neck of the gallbladder toward the porta hepatis. The distal gallbladder was thoroughly dissected. The cystic artery was identified in close triangle and the cystic duct gallbladder junction dissected 360.  A good critical view was obtained. When the anatomy was clear the cystic duct was clipped at the gallbladder junction and an operative cholangiogram obtained through the cystic duct. This showed good filling of a normal common bile duct and intrahepatic ducts with free flow into the duodenum and no filling defects. Following this the Cholangiocath was removed and the cystic duct was doubly clipped proximally and divided. The cystic artery was doubly clipped proximally and  distally and divided. The gallbladder was dissected free from its bed using hook cautery and removed through the umbilical port site. Complete hemostasis was obtained in the gallbladder bed. The right upper quadrant was thoroughly irrigated and hemostasis assured. Trochars were removed and all CO2 evacuated and the [facility] trocar site fascial defect closed. Skin incisions were closed with subcuticular Monocryl and Dermabond. Sponge needle and instrument counts were correct. The patient was taken to PACU in good condition.  Maylynn Orzechowski T  09/26/2011

## 2011-09-26 NOTE — Anesthesia Procedure Notes (Signed)
Procedure Name: Intubation Date/Time: 09/26/2011 10:02 AM Performed by: Sharlene Dory E Pre-anesthesia Checklist: Patient identified, Emergency Drugs available, Suction available, Patient being monitored and Timeout performed Patient Re-evaluated:Patient Re-evaluated prior to inductionOxygen Delivery Method: Circle system utilized Preoxygenation: Pre-oxygenation with 100% oxygen Intubation Type: IV induction Ventilation: Mask ventilation without difficulty Laryngoscope Size: Mac and 3 Grade View: Grade II Tube type: Oral Tube size: 8.0 mm Number of attempts: 1 Airway Equipment and Method: Stylet Placement Confirmation: ETT inserted through vocal cords under direct vision,  positive ETCO2 and breath sounds checked- equal and bilateral Secured at: 22 cm Tube secured with: Tape Dental Injury: Teeth and Oropharynx as per pre-operative assessment

## 2011-09-26 NOTE — Transfer of Care (Signed)
Immediate Anesthesia Transfer of Care Note  Patient: Frank Baird  Procedure(s) Performed: Procedure(s) (LRB): LAPAROSCOPIC CHOLECYSTECTOMY WITH INTRAOPERATIVE CHOLANGIOGRAM (N/A)  Patient Location: PACU  Anesthesia Type: General  Level of Consciousness: awake, alert  and oriented  Airway & Oxygen Therapy: Patient Spontanous Breathing and Patient connected to face mask oxygen  Post-op Assessment: Report given to PACU RN and Post -op Vital signs reviewed and stable  Post vital signs: Reviewed and stable  Complications: No apparent anesthesia complications

## 2011-09-26 NOTE — H&P (View-Only) (Signed)
Subjective:   Right upper quadrant abdominal pain  Patient ID: Frank Baird, male   DOB: [birth year], 51 y.o.   MRN: [id number]  HPI The patient comes to the office referred by Dr. Elnoria Howard for recent onset of significant right upper quadrant abdominal pain. The patient denies any previous history of similar pain. He has been followed for chronic reflux and history of Barrett's esophagus but these symptoms have been very well controlled on Nexium. He recently underwent right shoulder surgery and as he was recovering from this he developed the onset of intermittent right upper quadrant abdominal pain. He describes sharp stabbing pain just under his right rib cage. This is definitely related to eating. He has some nausea but no vomiting. This started about 4 weeks ago. The pain is steadily worsening to where it is now daily and almost constant and not tolerable. He has had some intermittent constipation and then diarrhea. No blood in his stools. He had a colonoscopy in 2005 with a benign polyp removed from his descending colon. His last upper endoscopy was 2005. No fever or chills. No jaundice. To date he has had a workup including a CT scan of the abdomen and abdominal ultrasound both of which were completely negative. He had a HIDA scan that showed normal ejection fraction of his gallbladder at 94% but he had severe right upper quadrant pain with CCK injection. He has seen Dr. Elnoria Howard for evaluation we felt that the symptoms were so suggestive of gallbladder disease that he would not necessarily recommend endoscopy at this point. Past Medical History  Diagnosis Date  . RUQ pain   . Arthritis   . Diabetes mellitus   . Hyperlipidemia   . Hypertension    Past Surgical History  Procedure Date  . Shoulder surgery 2012    right shoulder rotator cuff  . Nasal sinus surgery 2000  . Hernia repair     umbilical and right inguinal  . Elbow surgery     right elbow ligament   Current Outpatient Prescriptions    Medication Sig Dispense Refill  . CRESTOR 10 MG tablet       . HYDROcodone-acetaminophen (NORCO) 7.5-325 MG per tablet       . IBUPROFEN PO Take by mouth.      Marland Kitchen lisinopril (PRINIVIL,ZESTRIL) 20 MG tablet       . NEXIUM 40 MG capsule       . pioglitazone (ACTOS) 30 MG tablet        Allergies  Allergen Reactions  . Codeine     Bad stomach cramps    . Penicillins    History  Substance Use Topics  . Smoking status: Current Everyday Smoker -- 0.5 packs/day  . Smokeless tobacco: Never Used  . Alcohol Use: Yes    Review of Systems  Constitutional: Negative for fever and chills.  Respiratory: Negative for cough, shortness of breath and wheezing.   Cardiovascular: Positive for leg swelling. Negative for chest pain and palpitations.  Gastrointestinal: Positive for nausea, abdominal pain, diarrhea and constipation. Negative for vomiting.  Genitourinary: Negative for dysuria, urgency and difficulty urinating.  Musculoskeletal: Positive for myalgias and arthralgias.       Objective:   Physical Exam General: Alert, mildly obese Caucasian male, in no distress Skin: Warm and dry without rash or infection. HEENT: No palpable masses or thyromegaly. Sclera nonicteric. Pupils equal round and reactive. Oropharynx clear. Lymph nodes: No cervical, supraclavicular, or inguinal nodes palpable. Lungs: Breath sounds clear and equal without  increased work of breathing Cardiovascular: Regular rate and rhythm without murmur. No JVD or edema. Peripheral pulses intact. Abdomen: Nondistended. Soft.  There is moderate tenderness in the RUQ without guarding. No masses palpable. No organomegaly. No palpable hernias. Extremities: No edema or joint swelling or deformity. No chronic venous stasis changes. Neurologic: Alert and fully oriented. Gait normal.     Assessment:     Several weeks of persistent and severe postprandial right upper quadrant abdominal pain very suggestive of biliary source. He has a  thorough negative workup. I suspect he may well have biliary dyskinesia/chronic cholecystitis. I discussed this diagnosis at length with the patient. He has not had endoscopy but I would agree with Dr. Elnoria Howard that with his symptom complex this would be low yield. I discussed options with the patient including further workup with endoscopy versus proceeding with cholecystectomy in an effort to relieve his symptoms. He understands that the diagnosis is not completely clear and that he may not benefit from the surgery. He understands and strongly desires to proceed as soon as possible. We discussed the nature of the surgery, indications, risks of bleeding, infection, bile duct injury, he was given complete literature regarding the surgery. We'll schedule this for next week.    Plan:     Laparoscopic cholecystemy with cholangiogram under general anesthesia as an outpatient. Noted is that he has had an umbilical hernia repair with mesh which may af placement.

## 2011-09-26 NOTE — Interval H&P Note (Signed)
History and Physical Interval Note:  09/26/2011 9:48 AM  Frank Baird  has presented today for surgery, with the diagnosis of bilinary dyskinesia, chronic cholecystitis  The various methods of treatment have been discussed with the patient and family. After consideration of risks, benefits and other options for treatment, the patient has consented to  Procedure(s) (LRB): LAPAROSCOPIC CHOLECYSTECTOMY WITH INTRAOPERATIVE CHOLANGIOGRAM (N/A) as a surgical intervention .  The patients' history has been reviewed, patient examined, no change in status, stable for surgery.  I have reviewed the patients' chart and labs.  Questions were answered to the patient's satisfaction.     [name] T

## 2011-09-27 ENCOUNTER — Encounter (HOSPITAL_COMMUNITY): Payer: Self-pay | Admitting: General Surgery

## 2011-10-13 ENCOUNTER — Encounter (INDEPENDENT_AMBULATORY_CARE_PROVIDER_SITE_OTHER): Payer: Self-pay | Admitting: General Surgery

## 2011-10-13 ENCOUNTER — Ambulatory Visit (INDEPENDENT_AMBULATORY_CARE_PROVIDER_SITE_OTHER): Payer: Federal, State, Local not specified - PPO | Admitting: General Surgery

## 2011-10-13 VITALS — BP 136/84 | HR 72 | Temp 97.6°F | Resp 18 | Ht 69.0 in | Wt 234.2 lb

## 2011-10-13 DIAGNOSIS — Z09 Encounter for follow-up examination after completed treatment for conditions other than malignant neoplasm: Secondary | ICD-10-CM

## 2011-10-13 NOTE — Progress Notes (Signed)
History: Patient returns for his first postoperative check 2 weeks following laparoscopic cholecystectomy with cholangiogram for presumed biliary dyskinesia/chronic cholecystitis. He is having some significant discomfort still around his epigastric incision. This got a little worse when he tried to mow his lawn yesterday. He does not feel like he is having quite the same pain he was having preoperatively but this is a little bit hard to sort out. He had one episode of vomiting early on postoperatively but this has not recurred. No fever or chills.  Exam: BP 136/84  Pulse 72  Temp(Src) 97.6 F (36.4 C) (Temporal)  Resp 18  Ht 5\' 9"  (1.753 m)  Wt 234 lb 3.2 oz (106.232 kg)  BMI 34.59 kg/m2  Gen.: Does not appear ill Abdomen: Soft with minimal tenderness around the epigastric incision. Incisions are healing well.  Pathology: Showed minimal chronic cholecystitis  Assessment and plan: Do reasonably well postoperative complication identified. it is a little too early to tell what degree his symptoms will be relieved. i will see him back in one month.

## 2011-11-09 ENCOUNTER — Encounter (INDEPENDENT_AMBULATORY_CARE_PROVIDER_SITE_OTHER): Payer: Self-pay

## 2011-11-14 ENCOUNTER — Ambulatory Visit
Admission: RE | Admit: 2011-11-14 | Discharge: 2011-11-14 | Disposition: A | Discharge status: Home / Self Care | Payer: Federal, State, Local not specified - PPO | Source: Ambulatory Visit | Attending: Family Medicine | Admitting: Family Medicine

## 2011-11-14 ENCOUNTER — Other Ambulatory Visit: Payer: Self-pay | Admitting: Family Medicine

## 2011-11-14 MED ORDER — IOHEXOL 300 MG/ML  SOLN
30.0000 mL | Freq: Once | INTRAMUSCULAR | Status: AC | PRN
  Administered 2011-11-14: 30 mL via ORAL

## 2011-11-14 MED ORDER — IOHEXOL 300 MG/ML  SOLN
125.0000 mL | Freq: Once | INTRAMUSCULAR | Status: AC | PRN
  Administered 2011-11-14: 125 mL via INTRAVENOUS

## 2011-11-16 ENCOUNTER — Encounter (INDEPENDENT_AMBULATORY_CARE_PROVIDER_SITE_OTHER): Payer: Federal, State, Local not specified - PPO | Admitting: General Surgery

## 2012-01-19 ENCOUNTER — Ambulatory Visit (INDEPENDENT_AMBULATORY_CARE_PROVIDER_SITE_OTHER): Payer: Federal, State, Local not specified - PPO | Admitting: General Surgery

## 2012-01-19 ENCOUNTER — Encounter (INDEPENDENT_AMBULATORY_CARE_PROVIDER_SITE_OTHER): Payer: Self-pay | Admitting: General Surgery

## 2012-01-19 VITALS — BP 122/78 | HR 70 | Temp 97.8°F | Resp 14 | Ht 69.0 in | Wt 219.0 lb

## 2012-01-19 DIAGNOSIS — Z09 Encounter for follow-up examination after completed treatment for conditions other than malignant neoplasm: Secondary | ICD-10-CM

## 2012-01-19 NOTE — Progress Notes (Signed)
History: Patient comes to the office for long-term followup after laparoscopic cholecystectomy for apparent biliary dyskinesia. He was still having some pain on his early postop visit. He states this about 1 away and then he had an intestinal virus with a flareup of what seemed like the same pain. He had a couple of medications including Crestor and end-stage stopped and the pain resolved completely and he has been feeling well in regards to his abdomen without abdominal pain or nausea since.  Exam: Appears well. Abdomen soft and nontender. Wounds well healed without hernias or complications.  Assessment plan: Doing well following laparoscopic cholecystectomy for presumed biliary dyskinesia. He is now pain-free and discharge return as needed.

## 2012-10-21 ENCOUNTER — Encounter: Payer: Self-pay | Admitting: Physician Assistant

## 2012-10-21 ENCOUNTER — Ambulatory Visit (INDEPENDENT_AMBULATORY_CARE_PROVIDER_SITE_OTHER): Payer: Federal, State, Local not specified - PPO | Admitting: Physician Assistant

## 2012-10-21 VITALS — BP 152/80 | HR 60 | Temp 98.5°F | Resp 18 | Ht 68.5 in | Wt 214.0 lb

## 2012-10-21 DIAGNOSIS — M1990 Unspecified osteoarthritis, unspecified site: Secondary | ICD-10-CM

## 2012-10-21 DIAGNOSIS — I10 Essential (primary) hypertension: Secondary | ICD-10-CM | POA: Insufficient documentation

## 2012-10-21 DIAGNOSIS — E119 Type 2 diabetes mellitus without complications: Secondary | ICD-10-CM | POA: Insufficient documentation

## 2012-10-21 DIAGNOSIS — K219 Gastro-esophageal reflux disease without esophagitis: Secondary | ICD-10-CM

## 2012-10-21 DIAGNOSIS — M129 Arthropathy, unspecified: Secondary | ICD-10-CM

## 2012-10-21 DIAGNOSIS — Z1212 Encounter for screening for malignant neoplasm of rectum: Secondary | ICD-10-CM

## 2012-10-21 DIAGNOSIS — Z1211 Encounter for screening for malignant neoplasm of colon: Secondary | ICD-10-CM

## 2012-10-21 DIAGNOSIS — E785 Hyperlipidemia, unspecified: Secondary | ICD-10-CM | POA: Insufficient documentation

## 2012-10-21 DIAGNOSIS — K589 Irritable bowel syndrome without diarrhea: Secondary | ICD-10-CM

## 2012-10-21 MED ORDER — HYOSCYAMINE SULFATE ER 0.375 MG PO TB12: 0.3750 mg | ORAL_TABLET | Freq: Two times a day (BID) | ORAL | Status: DC | PRN

## 2012-10-22 LAB — CBC WITH DIFFERENTIAL/PLATELET
HCT: 48.4 % (ref 39.0–52.0)
Hemoglobin: 16.8 g/dL (ref 13.0–17.0)
Lymphocytes Relative: 25 % (ref 12–46)
Lymphs Abs: 2 10*3/uL (ref 0.7–4.0)
MCHC: 34.7 g/dL (ref 30.0–36.0)
Monocytes Absolute: 0.5 10*3/uL (ref 0.1–1.0)
Monocytes Relative: 6 % (ref 3–12)
Neutro Abs: 5.3 10*3/uL (ref 1.7–7.7)
Neutrophils Relative %: 68 % (ref 43–77)
RBC: 5.5 MIL/uL (ref 4.22–5.81)

## 2012-10-22 LAB — COMPLETE METABOLIC PANEL WITH GFR
Albumin: 4.3 g/dL (ref 3.5–5.2)
BUN: 10 mg/dL (ref 6–23)
CO2: 27 mEq/L (ref 19–32)
Calcium: 10.1 mg/dL (ref 8.4–10.5)
Chloride: 101 mEq/L (ref 96–112)
GFR, Est Non African American: >89 mL/min
Glucose, Bld: 147 mg/dL — ABNORMAL HIGH (ref 70–99)
Potassium: 4.2 mEq/L (ref 3.5–5.3)
Sodium: 136 mEq/L (ref 135–145)
Total Protein: 7.3 g/dL (ref 6.0–8.3)

## 2012-10-22 LAB — CELIAC PANEL 10
Tissue Transglut Ab: 9.4 U/mL (ref <20)
Tissue Transglutaminase Ab, IgA: 5.6 U/mL (ref <20)

## 2012-10-22 LAB — TSH: TSH: 2.073 u[IU]/mL (ref 0.350–4.500)

## 2012-10-22 LAB — HELICOBACTER PYLORI ABS-IGG+IGA, BLD
H Pylori IgG: 0.54 {ISR}
HELICOBACTER PYLORI AB, IGA: 3.4 U/mL (ref <9.0)

## 2012-10-22 NOTE — Progress Notes (Signed)
Patient ID: GEOVANNY SARTIN MRN: [id number], DOB: Apr 11, 1961, 52 y.o. Date of Encounter: 10/22/2012, 8:17 AM   Chief Complaint:  Chief Complaint  Patient presents with  . stomach and digestive problems x 1 mth    after eating/dinking  feels bloated w/abd pain    HPI: 52 y.o. year old male with history below presents with the following GI complaints.  Statesthat any time he eats or drinks, he feels bloated and has abdominal pain. Occasionally has sharp pain/crampy in periumbilical area 1-2 hours after he eats. He used to have very regular bowel habits. Used to have BM Q AM. Now, he alternates b/t loose stools and constipation. He has a lot of "gas." Rarely drinks alcohol. Takes Celebrex Q AM and Aleve at HS PRN. Takes Nexium QD. Has no pain in epigastric area. No raw,knawing pain there. Has had no fever/chills.    Past Medical History  Diagnosis Date  . RUQ pain   . Complication of anesthesia     difficulty waking up  . Arthritis   . GERD (gastroesophageal reflux disease)   . Diabetes mellitus   . Hyperlipidemia   . Hypertension      Home Meds: Current Outpatient Prescriptions on File Prior to Visit  Medication Sig Dispense Refill  . lisinopril (PRINIVIL,ZESTRIL) 20 MG tablet Take 20 mg by mouth daily.       . Naproxen Sodium (ALEVE) 220 MG CAPS Take by mouth 2 (two) times daily as needed.      Marland Kitchen NEXIUM 40 MG capsule Take 40 mg by mouth daily before breakfast.       . pioglitazone (ACTOS) 30 MG tablet Take 30 mg by mouth daily.        No current facility-administered medications on file prior to visit.    Allergies:  Allergies  Allergen Reactions  . Bee Venom     Unsure of reaction, Stung as baby and almost died  . Codeine     Bad stomach cramps    . Penicillins Other (See Comments)    unknown    History   Social History  . Marital Status: Married    Spouse Name: N/A    Number of Children: N/A  . Years of Education: N/A   Occupational History  . Not on file.    Social History Main Topics  . Smoking status: Current Every Day Smoker -- 0.50 packs/day  . Smokeless tobacco: Never Used  . Alcohol Use: Yes     Comment: rarely 0 to 1-2 per week  . Drug Use: No  . Sexually Active: Not on file   Other Topics Concern  . Not on file   Social History Narrative  . No narrative on file     Review of Systems: Constitutional: negative for chills, fever, night sweats, weight changes, or fatigue  HEENT: negative for vision changes or hearing loss Cardiovascular: negative for chest pain or palpitations Respiratory: negative for hemoptysis, wheezing, shortness of breath, or cough Abdominal: see above Dermatological: negative for rash Neurologic: negative for headache, dizziness, or syncope All other systems reviewed and are otherwise negative with the exception to those above and in the HPI.   Physical Exam: Blood pressure 152/80, pulse 60, temperature 98.5 F (36.9 C), temperature source Oral, resp. rate 18, height 5' 8.5" (1.74 m), weight 214 lb (97.07 kg)., Body mass index is 32.06 kg/(m^2). General: Well developed, well nourished, WM Appears in NO distress. Neck: Supple. No thyromegaly. Full ROM. No lymphadenopathy. Lungs: Clear  bilaterally to auscultation without wheezes, rales, or rhonchi. Breathing is unlabored. Heart: Regular rhythm. No murmurs, rubs, or gallops. Abdomen: Soft, non-distended with hyperactive bowel sounds. Lots of gurgling bowel sounds. No focal areas of tenderness with palpation. No hepatosplenomegaly. No rebound/guarding. No obvious abdominal masses. Negative McBurney's, Rovsing's, Iliopsoas. No abdominal bruits. No pulsatile mass.  Msk:  Strength and tone normal for age. Extremities/Skin: Warm and dry. No clubbing or cyanosis. No edema. No rashes or suspicious lesions. Neuro: Alert and oriented X 3. Moves all extremities spontaneously. Gait is normal. CNII-XII grossly in tact. Psych:  Responds to questions appropriately with  a normal affect.    ASSESSMENT AND PLAN:  52 y.o. year old male with  1. Irritable bowel syndrome - CBC with Differential - COMPLETE METABOLIC PANEL WITH GFR - TSH - Celiac panel 10 - Helicobacter pylori abs-IgG+IgA, bld - Ambulatory referral to Gastroenterology. See below. Refer b/c due for screening colonoscopy. Also, if these symptoms have not resolved by the time of his appt with Dr Elnoria Howard, this can be further evaluated at that time. Will Continue Nexium 40 mg QD. Continue Celebrex but d/c Aleve. Will add Levbid.  - hyoscyamine (LEVBID) 0.375 MG 12 hr tablet; Take 1 tablet (0.375 mg total) by mouth every 12 (twelve) hours as needed for cramping.  Dispense: 60 tablet; Refill: 0 If symptoms worsen, f/u immediately.  2. GERD (gastroesophageal reflux disease) Continue Nexium QD  3. Arthritis Cont Celebrex. D/C Aleve.  4. Screening for colorectal cancer He reports that he is due for f/u screening colonoscopy.  - Ambulatory referral to Gastroenterology   Signed,  Shon Hale [place], [place], [facility] 10/22/2012 8:17 AM

## 2012-10-23 ENCOUNTER — Telehealth: Payer: Self-pay | Admitting: Family Medicine

## 2012-10-23 NOTE — Telephone Encounter (Signed)
Spoke to patient about recent lab results per provider recommendations

## 2012-10-23 NOTE — Telephone Encounter (Signed)
Message copied by Donne Anon on Wed Oct 23, 2012 12:42 PM ------      Message from: Allayne Butcher      Created: Tue Oct 22, 2012  2:51 PM       I already sent prior note that all labs so far normal. At that point some were pending. All lab results now available. Tell pt that all labs are normal. Take meds as directed at OV.Also, recommend continue Celebrex but Stop Aleve. F/U with Dr Elnoria Howard as discussed. ------

## 2012-11-08 ENCOUNTER — Encounter: Payer: Self-pay | Admitting: Family Medicine

## 2012-11-08 ENCOUNTER — Ambulatory Visit (INDEPENDENT_AMBULATORY_CARE_PROVIDER_SITE_OTHER): Payer: Federal, State, Local not specified - PPO | Admitting: Family Medicine

## 2012-11-08 VITALS — BP 142/88 | HR 88 | Temp 99.0°F | Resp 20 | Wt 216.0 lb

## 2012-11-08 DIAGNOSIS — M549 Dorsalgia, unspecified: Secondary | ICD-10-CM

## 2012-11-08 DIAGNOSIS — E119 Type 2 diabetes mellitus without complications: Secondary | ICD-10-CM

## 2012-11-08 LAB — URINALYSIS, ROUTINE W REFLEX MICROSCOPIC
Bilirubin Urine: NEGATIVE
Ketones, ur: NEGATIVE mg/dL
Nitrite: NEGATIVE
Protein, ur: NEGATIVE mg/dL
Specific Gravity, Urine: 1.02 (ref 1.005–1.030)
Urobilinogen, UA: 1 mg/dL (ref 0.0–1.0)

## 2012-11-08 MED ORDER — TAMSULOSIN HCL 0.4 MG PO CAPS: 0.4000 mg | ORAL_CAPSULE | Freq: Every day | ORAL | Status: DC

## 2012-11-08 MED ORDER — OXYCODONE-ACETAMINOPHEN 10-325 MG PO TABS: 1.0000 | ORAL_TABLET | ORAL | Status: DC | PRN

## 2012-11-08 NOTE — Progress Notes (Signed)
Subjective:    Patient ID: Frank Baird, male    DOB: 22-Jan-1961, 52 y.o.   MRN: [id number]  HPI  Patient presents with sudden onset of right flank pain last evening.  It is colicky in nature and non radiating.  He denies polyuria, frequency, dysuria, urgency, or hematuria.  He denies N/V/D.  He denies constipation. Past Medical History  Diagnosis Date  . RUQ pain   . Complication of anesthesia     difficulty waking up  . Arthritis   . GERD (gastroesophageal reflux disease)   . Diabetes mellitus   . Hyperlipidemia   . Hypertension    Current Outpatient Prescriptions on File Prior to Visit  Medication Sig Dispense Refill  . celecoxib (CELEBREX) 200 MG capsule Take 200 mg by mouth 2 (two) times daily. Takes when can afford      . hyoscyamine (LEVBID) 0.375 MG 12 hr tablet Take 1 tablet (0.375 mg total) by mouth every 12 (twelve) hours as needed for cramping.  60 tablet  0  . lisinopril (PRINIVIL,ZESTRIL) 20 MG tablet Take 20 mg by mouth daily.       . Naproxen Sodium (ALEVE) 220 MG CAPS Take by mouth 2 (two) times daily as needed.      Marland Kitchen NEXIUM 40 MG capsule Take 40 mg by mouth daily before breakfast.       . pioglitazone (ACTOS) 30 MG tablet Take 30 mg by mouth daily.        No current facility-administered medications on file prior to visit.   Allergies  Allergen Reactions  . Bee Venom     Unsure of reaction, Stung as baby and almost died  . Codeine     Bad stomach cramps    . Penicillins Other (See Comments)    unknown   History   Social History  . Marital Status: Married    Spouse Name: N/A    Number of Children: N/A  . Years of Education: N/A   Occupational History  . Not on file.   Social History Main Topics  . Smoking status: Current Every Day Smoker -- 0.50 packs/day  . Smokeless tobacco: Never Used  . Alcohol Use: Yes     Comment: rarely 0 to 1-2 per week  . Drug Use: No  . Sexually Active: Not on file   Other Topics Concern  . Not on file    Social History Narrative  . No narrative on file     Review of Systems  All other systems reviewed and are negative.       Objective:   Physical Exam  Vitals reviewed. Cardiovascular: Normal rate, regular rhythm and normal heart sounds.   No murmur heard. Pulmonary/Chest: Effort normal and breath sounds normal. No respiratory distress. He has no wheezes.  Abdominal: Soft. Bowel sounds are normal. He exhibits no distension. There is no tenderness. There is no rebound and no guarding.  +CVAT        Assessment & Plan:  1. Back pain Symptoms may reflect a kidney stone. Then the patient Flomax 0.4 mg to take every day. Also given prescription for Percocet 10/325 one by mouth every 4 hours when necessary pain. Advised the patient to increase fluids. If pain worsens or if symptoms change is go to the hospital immediately to get a CAT scan. - Urinalysis, Routine w reflex microscopic  2. Type II or unspecified type diabetes mellitus without mention of complication, not stated as uncontrolled He is well overdue for  his diabetes check. Furthermore there is glucosuria on his urinalysis. Obtain BMP and a hemoglobin A1c to evaluate his control of his diabetes. - Basic Metabolic Panel - Hemoglobin A1c

## 2012-11-09 LAB — HEMOGLOBIN A1C
Hgb A1c MFr Bld: 7.6 % — ABNORMAL HIGH (ref <5.7)
Mean Plasma Glucose: 171 mg/dL — ABNORMAL HIGH (ref <117)

## 2012-11-09 LAB — BASIC METABOLIC PANEL
BUN: 13 mg/dL (ref 6–23)
Calcium: 9.7 mg/dL (ref 8.4–10.5)
Glucose, Bld: 221 mg/dL — ABNORMAL HIGH (ref 70–99)
Potassium: 4.3 mEq/L (ref 3.5–5.3)
Sodium: 136 mEq/L (ref 135–145)

## 2012-11-12 ENCOUNTER — Emergency Department (HOSPITAL_COMMUNITY): Payer: Federal, State, Local not specified - PPO | Admitting: Anesthesiology

## 2012-11-12 ENCOUNTER — Inpatient Hospital Stay (HOSPITAL_COMMUNITY)
Admission: EM | Admit: 2012-11-12 | Discharge: 2012-11-18 | DRG: 553 | Disposition: A | Discharge status: Home / Self Care | Payer: Federal, State, Local not specified - PPO | Attending: General Surgery | Admitting: General Surgery

## 2012-11-12 ENCOUNTER — Encounter (HOSPITAL_COMMUNITY)
Admission: EM | Disposition: A | Discharge status: Home / Self Care | Payer: Self-pay | Source: Home / Self Care | Attending: General Surgery

## 2012-11-12 ENCOUNTER — Ambulatory Visit (INDEPENDENT_AMBULATORY_CARE_PROVIDER_SITE_OTHER): Payer: Federal, State, Local not specified - PPO | Admitting: Family Medicine

## 2012-11-12 ENCOUNTER — Ambulatory Visit
Admission: RE | Admit: 2012-11-12 | Discharge: 2012-11-12 | Disposition: A | Discharge status: Home / Self Care | Payer: Federal, State, Local not specified - PPO | Source: Ambulatory Visit | Attending: Family Medicine | Admitting: Family Medicine

## 2012-11-12 ENCOUNTER — Encounter (HOSPITAL_COMMUNITY): Payer: Self-pay | Admitting: Anesthesiology

## 2012-11-12 ENCOUNTER — Encounter: Payer: Self-pay | Admitting: Family Medicine

## 2012-11-12 ENCOUNTER — Encounter (HOSPITAL_COMMUNITY): Payer: Self-pay | Admitting: *Deleted

## 2012-11-12 VITALS — BP 140/80 | HR 78 | Temp 98.5°F | Resp 18 | Wt 212.0 lb

## 2012-11-12 DIAGNOSIS — K3533 Acute appendicitis with perforation and localized peritonitis, with abscess: Secondary | ICD-10-CM

## 2012-11-12 DIAGNOSIS — R0902 Hypoxemia: Secondary | ICD-10-CM | POA: Diagnosis present

## 2012-11-12 DIAGNOSIS — R109 Unspecified abdominal pain: Secondary | ICD-10-CM

## 2012-11-12 DIAGNOSIS — E785 Hyperlipidemia, unspecified: Secondary | ICD-10-CM | POA: Diagnosis present

## 2012-11-12 DIAGNOSIS — I10 Essential (primary) hypertension: Secondary | ICD-10-CM | POA: Diagnosis present

## 2012-11-12 DIAGNOSIS — K219 Gastro-esophageal reflux disease without esophagitis: Secondary | ICD-10-CM | POA: Diagnosis present

## 2012-11-12 DIAGNOSIS — F17200 Nicotine dependence, unspecified, uncomplicated: Secondary | ICD-10-CM | POA: Diagnosis present

## 2012-11-12 DIAGNOSIS — C181 Malignant neoplasm of appendix: Principal | ICD-10-CM | POA: Diagnosis present

## 2012-11-12 DIAGNOSIS — R509 Fever, unspecified: Secondary | ICD-10-CM

## 2012-11-12 DIAGNOSIS — C800 Disseminated malignant neoplasm, unspecified: Secondary | ICD-10-CM

## 2012-11-12 DIAGNOSIS — K589 Irritable bowel syndrome without diarrhea: Secondary | ICD-10-CM

## 2012-11-12 DIAGNOSIS — C786 Secondary malignant neoplasm of retroperitoneum and peritoneum: Secondary | ICD-10-CM | POA: Diagnosis present

## 2012-11-12 DIAGNOSIS — Z5331 Laparoscopic surgical procedure converted to open procedure: Secondary | ICD-10-CM

## 2012-11-12 DIAGNOSIS — E119 Type 2 diabetes mellitus without complications: Secondary | ICD-10-CM | POA: Diagnosis present

## 2012-11-12 DIAGNOSIS — K352 Acute appendicitis with generalized peritonitis, without abscess: Secondary | ICD-10-CM | POA: Diagnosis present

## 2012-11-12 DIAGNOSIS — M129 Arthropathy, unspecified: Secondary | ICD-10-CM | POA: Diagnosis present

## 2012-11-12 DIAGNOSIS — R188 Other ascites: Secondary | ICD-10-CM | POA: Diagnosis present

## 2012-11-12 DIAGNOSIS — K35209 Acute appendicitis with generalized peritonitis, without abscess, unspecified as to perforation: Secondary | ICD-10-CM | POA: Diagnosis present

## 2012-11-12 HISTORY — PX: LAPAROSCOPIC APPENDECTOMY: SHX408

## 2012-11-12 LAB — URINALYSIS, ROUTINE W REFLEX MICROSCOPIC
Hgb urine dipstick: NEGATIVE
Leukocytes, UA: NEGATIVE
Nitrite: NEGATIVE
Specific Gravity, Urine: 1.02 (ref 1.005–1.030)
Urobilinogen, UA: 0.2 mg/dL (ref 0.0–1.0)
pH: 6 (ref 5.0–8.0)

## 2012-11-12 LAB — COMPREHENSIVE METABOLIC PANEL
ALT: 47 U/L (ref 0–53)
AST: 21 U/L (ref 0–37)
Albumin: 3.2 g/dL — ABNORMAL LOW (ref 3.5–5.2)
Calcium: 9.6 mg/dL (ref 8.4–10.5)
GFR calc Af Amer: >90 mL/min (ref >90)
Sodium: 130 mEq/L — ABNORMAL LOW (ref 135–145)
Total Protein: 7.5 g/dL (ref 6.0–8.3)

## 2012-11-12 LAB — CBC WITH DIFFERENTIAL/PLATELET
Basophils Relative: 0 % (ref 0–1)
HCT: 42.3 % (ref 39.0–52.0)
Hemoglobin: 14.7 g/dL (ref 13.0–17.0)
MCH: 30.2 pg (ref 26.0–34.0)
MCHC: 34.8 g/dL (ref 30.0–36.0)
Monocytes Absolute: 1.3 10*3/uL — ABNORMAL HIGH (ref 0.1–1.0)
Monocytes Relative: 11 % (ref 3–12)
Neutro Abs: 9 10*3/uL — ABNORMAL HIGH (ref 1.7–7.7)

## 2012-11-12 LAB — URINALYSIS, MICROSCOPIC ONLY: Casts: NONE SEEN

## 2012-11-12 SURGERY — APPENDECTOMY, LAPAROSCOPIC
Anesthesia: General | Site: Abdomen | Wound class: Clean

## 2012-11-12 MED ORDER — SODIUM CHLORIDE 0.9 % IV SOLN
1000.0000 mL | Freq: Once | INTRAVENOUS | Status: AC
  Administered 2012-11-12: 1000 mL via INTRAVENOUS

## 2012-11-12 MED ORDER — LIDOCAINE-EPINEPHRINE (PF) 1 %-1:200000 IJ SOLN
INTRAMUSCULAR | Status: AC
  Filled 2012-11-12: qty 10

## 2012-11-12 MED ORDER — METRONIDAZOLE IN NACL 5-0.79 MG/ML-% IV SOLN
500.0000 mg | Freq: Three times a day (TID) | INTRAVENOUS | Status: DC
  Administered 2012-11-13 – 2012-11-18 (×16): 500 mg via INTRAVENOUS
  Filled 2012-11-12 (×18): qty 100

## 2012-11-12 MED ORDER — ACETAMINOPHEN 10 MG/ML IV SOLN
INTRAVENOUS | Status: AC
  Filled 2012-11-12: qty 100

## 2012-11-12 MED ORDER — CIPROFLOXACIN IN D5W 400 MG/200ML IV SOLN
400.0000 mg | Freq: Two times a day (BID) | INTRAVENOUS | Status: DC
  Administered 2012-11-12 – 2012-11-18 (×12): 400 mg via INTRAVENOUS
  Filled 2012-11-12 (×13): qty 200

## 2012-11-12 MED ORDER — METRONIDAZOLE IN NACL 5-0.79 MG/ML-% IV SOLN
INTRAVENOUS | Status: DC | PRN
  Administered 2012-11-12: 500 mg via INTRAVENOUS

## 2012-11-12 MED ORDER — IOHEXOL 300 MG/ML  SOLN
125.0000 mL | Freq: Once | INTRAMUSCULAR | Status: AC | PRN
  Administered 2012-11-12: 125 mL via INTRAVENOUS

## 2012-11-12 MED ORDER — LACTATED RINGERS IV SOLN
INTRAVENOUS | Status: DC | PRN
  Administered 2012-11-12 (×3): via INTRAVENOUS

## 2012-11-12 MED ORDER — BUPIVACAINE HCL 0.25 % IJ SOLN
INTRAMUSCULAR | Status: DC | PRN
  Administered 2012-11-12: 7 mL

## 2012-11-12 MED ORDER — CISATRACURIUM BESYLATE (PF) 10 MG/5ML IV SOLN
INTRAVENOUS | Status: DC | PRN
  Administered 2012-11-12 (×2): 4 mg via INTRAVENOUS
  Administered 2012-11-12: 6 mg via INTRAVENOUS

## 2012-11-12 MED ORDER — HYDROMORPHONE HCL PF 1 MG/ML IJ SOLN
1.0000 mg | INTRAMUSCULAR | Status: DC | PRN
  Administered 2012-11-12: 1 mg via INTRAVENOUS
  Filled 2012-11-12: qty 1

## 2012-11-12 MED ORDER — ONDANSETRON HCL 4 MG/2ML IJ SOLN
4.0000 mg | Freq: Once | INTRAMUSCULAR | Status: AC
  Administered 2012-11-12: 4 mg via INTRAVENOUS
  Filled 2012-11-12: qty 2

## 2012-11-12 MED ORDER — ACETAMINOPHEN 10 MG/ML IV SOLN
INTRAVENOUS | Status: DC | PRN
  Administered 2012-11-12: 1000 mg via INTRAVENOUS

## 2012-11-12 MED ORDER — HYDROMORPHONE HCL PF 1 MG/ML IJ SOLN
INTRAMUSCULAR | Status: AC
  Filled 2012-11-12: qty 1

## 2012-11-12 MED ORDER — IOHEXOL 300 MG/ML  SOLN
30.0000 mL | Freq: Once | INTRAMUSCULAR | Status: AC | PRN
  Administered 2012-11-12: 30 mL via ORAL

## 2012-11-12 MED ORDER — CIPROFLOXACIN IN D5W 400 MG/200ML IV SOLN
INTRAVENOUS | Status: DC | PRN
  Administered 2012-11-12: 400 mg via INTRAVENOUS

## 2012-11-12 MED ORDER — BUPIVACAINE HCL (PF) 0.25 % IJ SOLN
INTRAMUSCULAR | Status: AC
  Filled 2012-11-12: qty 30

## 2012-11-12 MED ORDER — MIDAZOLAM HCL 5 MG/5ML IJ SOLN
INTRAMUSCULAR | Status: DC | PRN
  Administered 2012-11-12 (×2): 1 mg via INTRAVENOUS

## 2012-11-12 MED ORDER — METRONIDAZOLE IN NACL 5-0.79 MG/ML-% IV SOLN
INTRAVENOUS | Status: AC
  Filled 2012-11-12: qty 100

## 2012-11-12 MED ORDER — GLYCOPYRROLATE 0.2 MG/ML IJ SOLN
INTRAMUSCULAR | Status: DC | PRN
  Administered 2012-11-12: .8 mg via INTRAVENOUS

## 2012-11-12 MED ORDER — ONDANSETRON HCL 4 MG/2ML IJ SOLN
INTRAMUSCULAR | Status: DC | PRN
  Administered 2012-11-12 (×2): 2 mg via INTRAVENOUS

## 2012-11-12 MED ORDER — FENTANYL CITRATE 0.05 MG/ML IJ SOLN
INTRAMUSCULAR | Status: DC | PRN
  Administered 2012-11-12 (×3): 50 ug via INTRAVENOUS
  Administered 2012-11-12: 150 ug via INTRAVENOUS
  Administered 2012-11-12: 50 ug via INTRAVENOUS
  Administered 2012-11-12: 100 ug via INTRAVENOUS
  Administered 2012-11-12: 50 ug via INTRAVENOUS

## 2012-11-12 MED ORDER — NEOSTIGMINE METHYLSULFATE 1 MG/ML IJ SOLN
INTRAMUSCULAR | Status: DC | PRN
  Administered 2012-11-12: 4 mg via INTRAVENOUS

## 2012-11-12 MED ORDER — DEXAMETHASONE SODIUM PHOSPHATE 10 MG/ML IJ SOLN
INTRAMUSCULAR | Status: DC | PRN
  Administered 2012-11-12: 10 mg via INTRAVENOUS

## 2012-11-12 MED ORDER — SODIUM CHLORIDE 0.9 % IV SOLN: 1000.0000 mL | INTRAVENOUS | Status: DC

## 2012-11-12 MED ORDER — LIDOCAINE-EPINEPHRINE (PF) 1 %-1:200000 IJ SOLN
INTRAMUSCULAR | Status: DC | PRN
  Administered 2012-11-12: 7 mL

## 2012-11-12 MED ORDER — SUCCINYLCHOLINE CHLORIDE 20 MG/ML IJ SOLN
INTRAMUSCULAR | Status: DC | PRN
  Administered 2012-11-12: 140 mg via INTRAVENOUS

## 2012-11-12 MED ORDER — PROPOFOL 10 MG/ML IV EMUL
INTRAVENOUS | Status: DC | PRN
  Administered 2012-11-12: 200 mg via INTRAVENOUS

## 2012-11-12 MED ORDER — LIDOCAINE HCL (CARDIAC) 20 MG/ML IV SOLN
INTRAVENOUS | Status: DC | PRN
  Administered 2012-11-12: 75 mg via INTRAVENOUS

## 2012-11-12 SURGICAL SUPPLY — 61 items
ADH SKN CLS APL DERMABOND .7 (GAUZE/BANDAGES/DRESSINGS) ×1
APPLIER CLIP ROT 10 11.4 M/L (STAPLE)
APR CLP MED LRG 11.4X10 (STAPLE)
BAG SPEC RTRVL LRG 6X4 10 (ENDOMECHANICALS) ×1
BLADE EXTENDED COATED 6.5IN (ELECTRODE) ×2 IMPLANT
CANISTER SUCTION 2500CC (MISCELLANEOUS) IMPLANT
CHLORAPREP W/TINT 26ML (MISCELLANEOUS) ×2 IMPLANT
CLIP APPLIE ROT 10 11.4 M/L (STAPLE) IMPLANT
CLOTH BEACON ORANGE TIMEOUT ST (SAFETY) ×2 IMPLANT
COUNTER NEEDLE 20 DBL MAG RED (NEEDLE) ×1 IMPLANT
COVER MAYO STAND STRL (DRAPES) ×1 IMPLANT
COVER SURGICAL LIGHT HANDLE (MISCELLANEOUS) ×2 IMPLANT
CUTTER FLEX LINEAR 45M (STAPLE) ×2 IMPLANT
DECANTER SPIKE VIAL GLASS SM (MISCELLANEOUS) ×6 IMPLANT
DERMABOND ADVANCED (GAUZE/BANDAGES/DRESSINGS) ×1
DERMABOND ADVANCED .7 DNX12 (GAUZE/BANDAGES/DRESSINGS) ×1 IMPLANT
DRAIN CHANNEL 19F RND (DRAIN) ×1 IMPLANT
DRAPE TABLE BACK 44X90 PK DISP (DRAPES) ×1 IMPLANT
DRESSING TELFA 8X3 (GAUZE/BANDAGES/DRESSINGS) ×1 IMPLANT
ELECT BLADE TIP CTD 4 INCH (ELECTRODE) ×1 IMPLANT
ELECT REM PT RETURN 9FT ADLT (ELECTROSURGICAL) ×2
ELECTRODE REM PT RTRN 9FT ADLT (ELECTROSURGICAL) ×1 IMPLANT
EVACUATOR SILICONE 100CC (DRAIN) ×1 IMPLANT
GLOVE SURG SS PI 7.5 STRL IVOR (GLOVE) ×4 IMPLANT
GOWN STRL NON-REIN LRG LVL3 (GOWN DISPOSABLE) ×4 IMPLANT
GOWN STRL REIN XL XLG (GOWN DISPOSABLE) ×2 IMPLANT
HANDLE STAPLE ENDO GIA SHORT (STAPLE) ×1
KIT BASIN OR (CUSTOM PROCEDURE TRAY) ×2 IMPLANT
NS IRRIG 1000ML POUR BTL (IV SOLUTION) ×2 IMPLANT
POUCH SPECIMEN RETRIEVAL 10MM (ENDOMECHANICALS) ×2 IMPLANT
RELOAD 45 VASCULAR/THIN (ENDOMECHANICALS) IMPLANT
RELOAD EGIA 45 MED/THCK PURPLE (STAPLE) ×2 IMPLANT
RELOAD EGIA 45 TAN VASC (STAPLE) ×2 IMPLANT
RELOAD STAPLE 45 2.5 WHT GRN (ENDOMECHANICALS) IMPLANT
RELOAD STAPLE TA45 3.5 REG BLU (ENDOMECHANICALS) IMPLANT
SCALPEL HARMONIC ACE (MISCELLANEOUS) ×2 IMPLANT
SCISSORS LAP 5X35 DISP (ENDOMECHANICALS) IMPLANT
SET IRRIG TUBING LAPAROSCOPIC (IRRIGATION / IRRIGATOR) ×2 IMPLANT
SLEEVE ENDOPATH XCEL 5M (ENDOMECHANICALS) ×2 IMPLANT
SPONGE GAUZE 4X4 12PLY (GAUZE/BANDAGES/DRESSINGS) ×1 IMPLANT
SPONGE LAP 18X18 X RAY DECT (DISPOSABLE) ×3 IMPLANT
STAPLER ENDO GIA 12MM SHORT (STAPLE) ×1 IMPLANT
STAPLER VISISTAT 35W (STAPLE) ×1 IMPLANT
SUCTION POOLE TIP (SUCTIONS) ×2 IMPLANT
SUT MNCRL AB 4-0 PS2 18 (SUTURE) ×2 IMPLANT
SUT PDS AB 1 TP1 96 (SUTURE) ×2 IMPLANT
SUT SILK 2 0 (SUTURE) ×2
SUT SILK 2 0 SH CR/8 (SUTURE) ×1 IMPLANT
SUT SILK 2-0 18XBRD TIE 12 (SUTURE) IMPLANT
SUT SILK 3 0 (SUTURE) ×2
SUT SILK 3 0 SH CR/8 (SUTURE) ×1 IMPLANT
SUT SILK 3-0 18XBRD TIE 12 (SUTURE) IMPLANT
SYR BULB IRRIGATION 50ML (SYRINGE) ×2 IMPLANT
TAPE CLOTH SURG 4X10 WHT LF (GAUZE/BANDAGES/DRESSINGS) ×1 IMPLANT
TOWEL OR 17X24 6PK STRL BLUE (TOWEL DISPOSABLE) ×2 IMPLANT
TOWEL OR 17X26 10 PK STRL BLUE (TOWEL DISPOSABLE) ×2 IMPLANT
TRAY FOLEY CATH 14FR (SET/KITS/TRAYS/PACK) ×2 IMPLANT
TROCAR XCEL BLUNT TIP 100MML (ENDOMECHANICALS) ×2 IMPLANT
TROCAR XCEL NON-BLD 5MMX100MML (ENDOMECHANICALS) ×2 IMPLANT
WATER STERILE IRR 1000ML POUR (IV SOLUTION) IMPLANT
YANKAUER SUCT BULB TIP 10FT TU (MISCELLANEOUS) ×2 IMPLANT

## 2012-11-12 NOTE — ED Notes (Signed)
Pt presents with stomach pain for 6 weeks.  Seen by PCP- CT scan shows. Ruptured appendix

## 2012-11-12 NOTE — Progress Notes (Signed)
Subjective:    Patient ID: Frank Baird, male    DOB: July 30, 1960, 52 y.o.   MRN: [id number]  HPI  11/08/12 Patient presents with sudden onset of right flank pain last evening.  It is colicky in nature and non radiating.  He denies polyuria, frequency, dysuria, urgency, or hematuria.  He denies N/V/D.  He denies constipation.  Therefore, I diagnosed him with:   Assessment & Plan:  1. Back pain Symptoms may reflect a kidney stone. Then the patient Flomax 0.4 mg to take every day. Also given prescription for Percocet 10/325 one by mouth every 4 hours when necessary pain. Advised the patient to increase fluids. If pain worsens or if symptoms change is go to the hospital immediately to get a CAT scan. - Urinalysis, Routine w reflex microscopic-clear  11/12/12 Patient now reports 5 days of fever which is intermittent and ranging from 98-101. He reports nausea. He has had no vomiting or diarrhea. He denies any constipation. He does have some cough. He has pleurisy in the bilateral posterior rib cage. He has back pain which even hurts to lay down on the bed. He is also having abdominal pain. His abdomen is tender to palpation in all 4 quadrants. It is nondistended. He has voluntary guarding.  He denies hematuria or dysuria. He denies shortness of breath.    Past Medical History  Diagnosis Date  . RUQ pain   . Complication of anesthesia     difficulty waking up  . Arthritis   . GERD (gastroesophageal reflux disease)   . Diabetes mellitus   . Hyperlipidemia   . Hypertension    Current Outpatient Prescriptions on File Prior to Visit  Medication Sig Dispense Refill  . celecoxib (CELEBREX) 200 MG capsule Take 200 mg by mouth 2 (two) times daily. Takes when can afford      . hyoscyamine (LEVBID) 0.375 MG 12 hr tablet Take 1 tablet (0.375 mg total) by mouth every 12 (twelve) hours as needed for cramping.  60 tablet  0  . lisinopril (PRINIVIL,ZESTRIL) 20 MG tablet Take 20 mg by mouth daily.       .  Naproxen Sodium (ALEVE) 220 MG CAPS Take by mouth 2 (two) times daily as needed.      Marland Kitchen NEXIUM 40 MG capsule Take 40 mg by mouth daily before breakfast.       . oxyCODONE-acetaminophen (PERCOCET) 10-325 MG per tablet Take 1 tablet by mouth every 4 (four) hours as needed for pain.  30 tablet  0  . pioglitazone (ACTOS) 30 MG tablet Take 30 mg by mouth daily.        No current facility-administered medications on file prior to visit.   Allergies  Allergen Reactions  . Bee Venom     Unsure of reaction, Stung as baby and almost died  . Codeine     Bad stomach cramps    . Penicillins Other (See Comments)    unknown   History   Social History  . Marital Status: Married    Spouse Name: N/A    Number of Children: N/A  . Years of Education: N/A   Occupational History  . Not on file.   Social History Main Topics  . Smoking status: Current Every Day Smoker -- 0.50 packs/day  . Smokeless tobacco: Never Used  . Alcohol Use: Yes     Comment: rarely 0 to 1-2 per week  . Drug Use: No  . Sexually Active: Not on file  Other Topics Concern  . Not on file   Social History Narrative  . No narrative on file     Review of Systems  All other systems reviewed and are negative.       Objective:   Physical Exam  Vitals reviewed. Cardiovascular: Normal rate, regular rhythm and normal heart sounds.   No murmur heard. Pulmonary/Chest: Effort normal and breath sounds normal. No respiratory distress. He has no wheezes.  Abdominal: Soft. Bowel sounds are normal. He exhibits no distension. There is tenderness (in right lower quadrant and left lower quadrant). There is guarding. There is no rebound.  Skin: Skin is warm and dry. No rash noted. No erythema. No pallor.  +CVAT        Assessment & Plan:  1. Fever, unspecified - CT Abdomen Pelvis W Contrast; Future - Basic Metabolic Panel - CBC with Differential - Hepatic Function Panel - Urinalysis, Routine w reflex microscopic  2.  Abdominal pain, unspecified site I do not feel this is a kidney stone. The patient is nontoxic appearing. I will obtain CT of abdomen and pelvis ASAP to evaluate for other causes of his abdominal pain. Today in clinic we had no electrical power.  Therefore, at this time I am unable to obtain a CBC or urinalysis. I will send these labs to [facility].  In the meantime, I'm ordering a CT scan. Patient is advised to go to the hospital if symptoms worsen until I see the results of the CT scan/labwork and I am able to develop a plan of care. - CT Abdomen Pelvis W Contrast; Future - Basic Metabolic Panel - CBC with Differential - Hepatic Function Panel - Urinalysis, Routine w reflex microscopic

## 2012-11-12 NOTE — ED Provider Notes (Signed)
History    CSN: [id number] Arrival date & time 11/12/12  7829 First MD Initiated Contact with Patient 11/12/12 1830     Chief Complaint  Patient presents with  . Abdominal Pain   HPI The patient presents to the emergency room with complaints of abdominal pain. Patient states he initially had an episode of pain about 6 weeks ago. He saw his doctor and was treated for a possible bacterial infection. Symptoms improved and he subsequently started having the pain again last week. He saw another doctor and was treated for possible kidney stone. Patient was instructed return of the symptoms increased. Symptoms worsened over the weekend with were much more severe today .  He saw his doctor once again and had an outpatient CT scan that showed a ruptured appendicitis. Patient denies any nausea or vomiting. He has been having fevers in the mornings. Pain is moderate to severe and increases with palpation and movement Past Medical History  Diagnosis Date  . RUQ pain   . Complication of anesthesia     difficulty waking up  . Arthritis   . GERD (gastroesophageal reflux disease)   . Diabetes mellitus   . Hyperlipidemia   . Hypertension     Past Surgical History  Procedure Laterality Date  . Shoulder surgery  2012    right shoulder rotator cuff  . Nasal sinus surgery  2000  . Hernia repair      umbilical and right inguinal  . Elbow surgery      right elbow ligament  . Eye surgery      bilateral cataract surgery  . Cholecystectomy  09/26/2011    Procedure: LAPAROSCOPIC CHOLECYSTECTOMY WITH INTRAOPERATIVE CHOLANGIOGRAM;  Surgeon: Mariella Saa, MD;  Location: [facility] OR;  Service: General;  Laterality: N/A;  . Laparoscopic appendectomy N/A 11/12/2012    Procedure: DIAGNOSTIC LAPAROSCOPY, OPEN EXPLORATORY LAPAROTOMY, INCISIONAL BIOPSIES OF CALICFORM LIGAMENT, MESSENTERY, UMBILICAL LIGAMENT, BAND OF DUODENUM;  Surgeon: Lodema Pilot, DO;  Location: [facility] ORS;  Service: General;  Laterality: N/A;     Family History  Problem Relation Age of Onset  . Heart disease Mother     History  Substance Use Topics  . Smoking status: Current Every Day Smoker -- 0.50 packs/day  . Smokeless tobacco: Never Used  . Alcohol Use: Yes     Comment: rarely 0 to 1-2 per week      Review of Systems  All other systems reviewed and are negative.    Allergies  Bee venom; Codeine; and Penicillins  Home Medications   No current outpatient prescriptions on file.  BP 154/65  Pulse 80  Temp(Src) 99 F (37.2 C) (Oral)  Resp 10  Ht 5\' 9"  (1.753 m)  Wt 212 lb (96.163 kg)  BMI 31.29 kg/m2  SpO2 96%  Physical Exam  Nursing note and vitals reviewed. Constitutional: He appears well-developed and well-nourished. No distress.  HENT:  Head: Normocephalic and atraumatic.  Right Ear: External ear normal.  Left Ear: External ear normal.  Eyes: Conjunctivae are normal. Right eye exhibits no discharge. Left eye exhibits no discharge. No scleral icterus.  Neck: Neck supple. No tracheal deviation present.  Cardiovascular: Normal rate, regular rhythm and intact distal pulses.   Pulmonary/Chest: Effort normal and breath sounds normal. No stridor. No respiratory distress. He has no wheezes. He has no rales.  Abdominal: Soft. Bowel sounds are normal. He exhibits no distension and no mass. There is tenderness in the right lower quadrant. There is no rigidity, no rebound and  no guarding.  Musculoskeletal: He exhibits no edema and no tenderness.  Neurological: He is alert. He has normal strength. No sensory deficit. Cranial nerve deficit:  no gross defecits noted. He exhibits normal muscle tone. He displays no seizure activity. Coordination normal.  Skin: Skin is warm and dry. No rash noted.  Psychiatric: He has a normal mood and affect.    ED Course  Procedures (including critical care time)  Labs Reviewed  COMPREHENSIVE METABOLIC PANEL - Abnormal; Notable for the following:    Sodium 130 (*)     Chloride 94 (*)    Glucose, Bld 162 (*)    Albumin 3.2 (*)    Alkaline Phosphatase 124 (*)    All other components within normal limits  CBC WITH DIFFERENTIAL - Abnormal; Notable for the following:    WBC 12.0 (*)    Neutro Abs 9.0 (*)    Monocytes Absolute 1.3 (*)    All other components within normal limits  GLUCOSE, CAPILLARY - Abnormal; Notable for the following:    Glucose-Capillary 277 (*)    All other components within normal limits  GLUCOSE, CAPILLARY - Abnormal; Notable for the following:    Glucose-Capillary 222 (*)    All other components within normal limits  GLUCOSE, CAPILLARY - Abnormal; Notable for the following:    Glucose-Capillary 189 (*)    All other components within normal limits  GLUCOSE, CAPILLARY - Abnormal; Notable for the following:    Glucose-Capillary 169 (*)    All other components within normal limits  CBC - Abnormal; Notable for the following:    WBC 15.3 (*)    All other components within normal limits  BASIC METABOLIC PANEL - Abnormal; Notable for the following:    Glucose, Bld 133 (*)    All other components within normal limits  GLUCOSE, CAPILLARY - Abnormal; Notable for the following:    Glucose-Capillary 171 (*)    All other components within normal limits  GLUCOSE, CAPILLARY - Abnormal; Notable for the following:    Glucose-Capillary 173 (*)    All other components within normal limits  GLUCOSE, CAPILLARY - Abnormal; Notable for the following:    Glucose-Capillary 129 (*)    All other components within normal limits  GLUCOSE, CAPILLARY - Abnormal; Notable for the following:    Glucose-Capillary 148 (*)    All other components within normal limits  GLUCOSE, CAPILLARY - Abnormal; Notable for the following:    Glucose-Capillary 127 (*)    All other components within normal limits  URINALYSIS, ROUTINE W REFLEX MICROSCOPIC  SURGICAL PATHOLOGY  CYTOLOGY - NON PAP   Ct Abdomen Pelvis W Contrast  11/12/2012   *RADIOLOGY REPORT*  Clinical  Data: Right flank pain fever, and guarding for 6 weeks.  CT ABDOMEN AND PELVIS WITH CONTRAST  Technique:  Multidetector CT imaging of the abdomen and pelvis was performed following the standard protocol during bolus administration of intravenous contrast.  Contrast: 30mL OMNIPAQUE IOHEXOL 300 MG/ML  SOLN, OMNIPAQUE IOHEXOL 300 MG/ML  SOLN  Comparison: CT abdomen pelvis 11/14/2011  Findings: The lung bases are clear.  There are reactive size right juxtadiaphragmatic lymph nodes. Additionally, there is a lymph node anterior to the pericardium in the midline that has increased since prior CT, and measures 7 mm short axis.  There is a small amount of perihepatic ascites and there is some scattered fluid and/or stranding in the mesenteries of the right abdomen.  The appendix is dilated and has enhancing walls, measuring up  to 10 mm diameter on image number 64.  Medial and adjacent to the base of the appendix and cecum is a fluid and gas collection consistent with an abscess.  This abscess approximately 4.1 x 4.4 cm axial dimension and 5.1 cm craniocaudal span.  There are reactive size lymph nodes in the right ileocolic mesentery. There are may be a tiny amount of free fluid in the right aspect of the dependent portion of the pelvis on image number 83.  No evidence of bowel obstruction.  No free intraperitoneal air is identified.  Mild diffuse fatty infiltration of the liver.  No focal hepatic lesion or biliary ductal dilatation.  Patient is status post cholecystectomy.  The spleen, adrenal glands, kidneys, and pancreas are within normal limits.  The abdominal aorta is normal caliber and contains scattered atherosclerotic calcification.  The urinary bladder prostate gland seminal vesicles are within normal limits.  No acute bony abnormality is identified.  IMPRESSION:  1.  Findings highly suggestive of acute appendicitis complicated by rupture and formation of a right lower quadrant abscess, with reactive adjacent  lymphadenopathy. This study is made a call report.  2.  Small amount of perihepatic ascites may be due to appendiceal rupture, or may be reactive. 3.  Reactive size juxta-diaphragmatic lymph nodes.   Original Report Authenticated By: Britta Mccreedy, M.D.   Dg Chest Port 1 View  11/14/2012   *RADIOLOGY REPORT*  Clinical Data: Hypoxia with chronic cough and history of chronic bronchitis.  Hypertension.  Smoker with diabetes  PORTABLE CHEST - 1 VIEW  Comparison: 09/25/2011  Findings: Heart size is prominent but felt likely to be within normal limits given the projection.  A normal mediastinal contour is seen.  The lung fields are notable for linear atelectasis at both lung bases.  No focal infiltrates or signs of congestive failure are seen.  No pleural fluid or significant peribronchial cuffing is noted.  Bony structures appear intact.  IMPRESSION: Bibasilar subsegmental linear atelectasis.  Otherwise stable   Original Report Authenticated By: Rhodia Albright, M.D.     1. Acute appendicitis with perforation and peritoneal abscess       MDM  The patient has a ruptured appendicitis. He was sent from his primary Dr. I will start him on IV fluids and antibiotics. I will consult with general surgery.       Celene Kras, MD 11/14/12 [phone]

## 2012-11-12 NOTE — Transfer of Care (Signed)
Immediate Anesthesia Transfer of Care Note  Patient: Frank Baird  Procedure(s) Performed: Procedure(s): DIAGNOSTIC LAPAROSCOPY, OPEN EXPLORATORY LAPAROTOMY, INCISIONAL BIOPSIES OF CALICFORM LIGAMENT, MESSENTERY, UMBILICAL LIGAMENT, BAND OF DUODENUM (N/A)  Patient Location: PACU  Anesthesia Type:General  Level of Consciousness: sedated  Airway & Oxygen Therapy: Patient Spontanous Breathing and Patient connected to face mask oxygen  Post-op Assessment: Report given to PACU RN and Post -op Vital signs reviewed and stable  Post vital signs: Reviewed and unstable  Complications: No apparent anesthesia complications

## 2012-11-12 NOTE — ED Notes (Signed)
Pt had CT of abd today. Family practice called and informed triage RN that pt has ruptured appendix with RLQ abscess.

## 2012-11-12 NOTE — H&P (Addendum)
Reason for Consult:appendicitis Referring Physician: [name] is an 52 y.o. male.  HPI: this patient presents to the [facility] long emergency room for evaluation of a perforated appendicitis. He has had several weeks of "bowel issues" and has been seen by his primary care practice for evaluation of this. He is actually set up to have a colonoscopy for further workup of this abdominal pain. He says that he has been having some right "kidney pain" over the last several weeks but Friday morning began having increased right-sided flank pain and this change to abdominal pain about 2 days ago located in the right side of his abdomen and right lower quadrant. He did have an episode of nausea on Sunday without any vomiting. He says that he has been having low-grade fevers every morning as well.  He says that he has been moving his bowels although over the last few weeks he has alternating with diarrhea and constipation. He denies any blood in the stools or black stools. He says that he had a colonoscopy 7 years ago which was normal. He also says that he is a diabetic and it really has not been very well controlled on his oral regimen.  He does have high blood pressures as well but denies any heart problems.  Past Medical History  Diagnosis Date  . RUQ pain   . Complication of anesthesia     difficulty waking up  . Arthritis   . GERD (gastroesophageal reflux disease)   . Diabetes mellitus   . Hyperlipidemia   . Hypertension     Past Surgical History  Procedure Laterality Date  . Shoulder surgery  2012    right shoulder rotator cuff  . Nasal sinus surgery  2000  . Hernia repair      umbilical and right inguinal  . Elbow surgery      right elbow ligament  . Eye surgery      bilateral cataract surgery  . Cholecystectomy  09/26/2011    Procedure: LAPAROSCOPIC CHOLECYSTECTOMY WITH INTRAOPERATIVE CHOLANGIOGRAM;  Surgeon: Frank Saa, MD;  Location: [facility] OR;  Service: General;   Laterality: N/A;    Family History  Problem Relation Age of Onset  . Heart disease Mother     Social History:  reports that he has been smoking.  He has never used smokeless tobacco. He reports that  drinks alcohol. He reports that he does not use illicit drugs.  Allergies:  Allergies  Allergen Reactions  . Bee Venom     Unsure of reaction, Stung as baby and almost died  . Codeine     Bad stomach cramps    . Penicillins Other (See Comments)    unknown    Medications: I have reviewed the patient's current medications.  Results for orders placed during the hospital encounter of 11/12/12 (from the past 48 hour(s))  COMPREHENSIVE METABOLIC PANEL     Status: Abnormal   Collection Time    11/12/12  6:50 PM      Result Value Range   Sodium 130 (*) 135 - 145 mEq/L   Potassium 3.9  3.5 - 5.1 mEq/L   Chloride 94 (*) 96 - 112 mEq/L   CO2 23  19 - 32 mEq/L   Glucose, Bld 162 (*) 70 - 99 mg/dL   BUN 7  6 - 23 mg/dL   Creatinine, Ser 1.61  0.50 - 1.35 mg/dL   Calcium 9.6  8.4 - 09.6 mg/dL   Total Protein 7.5  6.0 - 8.3 g/dL   Albumin 3.2 (*) 3.5 - 5.2 g/dL   AST 21  0 - 37 U/L   ALT 47  0 - 53 U/L   Alkaline Phosphatase 124 (*) 39 - 117 U/L   Total Bilirubin 0.7  0.3 - 1.2 mg/dL   GFR calc non Af Amer >90  >90 mL/min   GFR calc Af Amer >90  >90 mL/min   Comment:            The eGFR has been calculated     using the CKD EPI equation.     This calculation has not been     validated in all clinical     situations.     eGFR's persistently     <90 mL/min signify     possible Chronic Kidney Disease.  CBC WITH DIFFERENTIAL     Status: Abnormal   Collection Time    11/12/12  6:50 PM      Result Value Range   WBC 12.0 (*) 4.0 - 10.5 K/uL   RBC 4.86  4.22 - 5.81 MIL/uL   Hemoglobin 14.7  13.0 - 17.0 g/dL   HCT 16.1  09.6 - 04.5 %   MCV 87.0  78.0 - 100.0 fL   MCH 30.2  26.0 - 34.0 pg   MCHC 34.8  30.0 - 36.0 g/dL   RDW 40.9  81.1 - 91.4 %   Platelets 257  150 - 400 K/uL    Neutrophils Relative % 75  43 - 77 %   Neutro Abs 9.0 (*) 1.7 - 7.7 K/uL   Lymphocytes Relative 14  12 - 46 %   Lymphs Abs 1.6  0.7 - 4.0 K/uL   Monocytes Relative 11  3 - 12 %   Monocytes Absolute 1.3 (*) 0.1 - 1.0 K/uL   Eosinophils Relative 1  0 - 5 %   Eosinophils Absolute 0.1  0.0 - 0.7 K/uL   Basophils Relative 0  0 - 1 %   Basophils Absolute 0.0  0.0 - 0.1 K/uL    Ct Abdomen Pelvis W Contrast  11/12/2012  *RADIOLOGY REPORT*  Clinical Data: Right flank pain fever, and guarding for 6 weeks.  CT ABDOMEN AND PELVIS WITH CONTRAST  Technique:  Multidetector CT imaging of the abdomen and pelvis was performed following the standard protocol during bolus administration of intravenous contrast.  Contrast: 30mL OMNIPAQUE IOHEXOL 300 MG/ML  SOLN, OMNIPAQUE IOHEXOL 300 MG/ML  SOLN  Comparison: CT abdomen pelvis 11/14/2011  Findings: The lung bases are clear.  There are reactive size right juxtadiaphragmatic lymph nodes. Additionally, there is a lymph node anterior to the pericardium in the midline that has increased since prior CT, and measures 7 mm short axis.  There is a small amount of perihepatic ascites and there is some scattered fluid and/or stranding in the mesenteries of the right abdomen.  The appendix is dilated and has enhancing walls, measuring up to 10 mm diameter on image number 64.  Medial and adjacent to the base of the appendix and cecum is a fluid and gas collection consistent with an abscess.  This abscess approximately 4.1 x 4.4 cm axial dimension and 5.1 cm craniocaudal span.  There are reactive size lymph nodes in the right ileocolic mesentery. There are may be a tiny amount of free fluid in the right aspect of the dependent portion of the pelvis on image number 83.  No evidence of bowel obstruction.  No free intraperitoneal  air is identified.  Mild diffuse fatty infiltration of the liver.  No focal hepatic lesion or biliary ductal dilatation.  Patient is status post  cholecystectomy.  The spleen, adrenal glands, kidneys, and pancreas are within normal limits.  The abdominal aorta is normal caliber and contains scattered atherosclerotic calcification.  The urinary bladder prostate gland seminal vesicles are within normal limits.  No acute bony abnormality is identified.  IMPRESSION:  1.  Findings highly suggestive of acute appendicitis complicated by rupture and formation of a right lower quadrant abscess, with reactive adjacent lymphadenopathy. This study is made a call report.  2.  Small amount of perihepatic ascites may be due to appendiceal rupture, or may be reactive. 3.  Reactive size juxta-diaphragmatic lymph nodes.   Original Report Authenticated By: Britta Mccreedy, M.D.     All other review of systems negative or noncontributory except as stated in the HPI   Blood pressure 165/88, pulse 78, temperature 98.9 F (37.2 C), temperature source Oral, resp. rate 20, SpO2 100.00%. General appearance: alert, cooperative and no distress Head: Normocephalic, without obvious abnormality, atraumatic Neck: no JVD and supple, symmetrical, trachea midline Resp: nonlabored, sats normal Cardio: normal rate, regular GI: soft, moderate midabdominal and right sided abdominal pain, ND, no left sided pain and no peritoneal signs Extremities: normal Pulses: 2+ and symmetric Skin: Skin color, texture, turgor normal. No rashes or lesions Neurologic: Alert and oriented X 3, normal strength and tone. Normal symmetric reflexes. Normal coordination and gait  Assessment/Plan: Perforated appendicitis with abscess He appears to have a perforated appendicitis by history and also on imaging studies. His white blood cell count is elevated and he is tender in the right side of his abdomen. I discussed with him the options for diagnostic laparoscopy and appendectomy with a decent chance for open surgery versus possible percutaneous drainage of the abscess and antibiotic management with  possible interval appendectomy. He is not really interested in having percutaneous drainage of and delay treatment.  We did discuss the pros and cons and the risks and benefits of each. We also discussed the risk of surgery including infection, bleeding, pain, scarring, need for open surgery and the need for possible bowel resection, postoperative abscess and ileus, and injury to surround structures and he expressed understanding and would like to proceed with surgical treatment. I did explain the benefits of possible antibiotic management and percutaneous drainage with interval appendectomy paramedic and he would like to go ahead and proceed with surgical intervention.  Lodema Pilot DAVID 11/12/2012, 7:52 PM      I spoke with the radiologist and he does not feel that this would be easily drained right now regardless.  It is not well defined and it is located medial to the cecum and would likely be difficult to drain percutaneously.  We will continue with the plan for dx laparoscopy and appendectomy.

## 2012-11-12 NOTE — Anesthesia Preprocedure Evaluation (Addendum)
Anesthesia Evaluation  Patient identified by MRN, date of birth, ID band Patient awake    Reviewed: Allergy & Precautions, H&P , NPO status , Patient's Chart, lab work & pertinent test results  Airway Mallampati: I TM Distance: >3 FB Neck ROM: Full    Dental  (+) Teeth Intact, Poor Dentition and Dental Advisory Given   Pulmonary neg pulmonary ROS,  breath sounds clear to auscultation        Cardiovascular hypertension, Pt. on medications Rhythm:Regular Rate:Normal     Neuro/Psych negative neurological ROS  negative psych ROS   GI/Hepatic Neg liver ROS, GERD-  Medicated,  Endo/Other  diabetes, Type 2, Oral Hypoglycemic Agents  Renal/GU negative Renal ROS     Musculoskeletal negative musculoskeletal ROS (+)   Abdominal (+) + obese,   Peds  Hematology negative hematology ROS (+)   Anesthesia Other Findings   Reproductive/Obstetrics                           Anesthesia Physical  Anesthesia Plan  ASA: III and emergent  Anesthesia Plan: General   Post-op Pain Management:    Induction: Intravenous, Rapid sequence and Cricoid pressure planned  Airway Management Planned: Oral ETT  Additional Equipment:   Intra-op Plan:   Post-operative Plan: Extubation in OR  Informed Consent: I have reviewed the patients History and Physical, chart, labs and discussed the procedure including the risks, benefits and alternatives for the proposed anesthesia with the patient or authorized representative who has indicated his/her understanding and acceptance.   Dental advisory given  Plan Discussed with: CRNA  Anesthesia Plan Comments:         Anesthesia Quick Evaluation

## 2012-11-13 ENCOUNTER — Encounter (HOSPITAL_COMMUNITY): Payer: Self-pay | Admitting: General Surgery

## 2012-11-13 LAB — CBC WITH DIFFERENTIAL/PLATELET
Hemoglobin: 14.8 g/dL (ref 13.0–17.0)
Lymphocytes Relative: 12 % (ref 12–46)
Lymphs Abs: 1.4 10*3/uL (ref 0.7–4.0)
Monocytes Relative: 10 % (ref 3–12)
Neutro Abs: 8.9 10*3/uL — ABNORMAL HIGH (ref 1.7–7.7)
Neutrophils Relative %: 77 % (ref 43–77)
Platelets: 266 10*3/uL (ref 150–400)
RBC: 5.05 MIL/uL (ref 4.22–5.81)
WBC: 11.6 10*3/uL — ABNORMAL HIGH (ref 4.0–10.5)

## 2012-11-13 LAB — BASIC METABOLIC PANEL
BUN: 8 mg/dL (ref 6–23)
Creat: 0.72 mg/dL (ref 0.50–1.35)

## 2012-11-13 LAB — HEPATIC FUNCTION PANEL
AST: 20 U/L (ref 0–37)
Albumin: 3.4 g/dL — ABNORMAL LOW (ref 3.5–5.2)
Alkaline Phosphatase: 91 U/L (ref 39–117)
Bilirubin, Direct: 0.2 mg/dL (ref 0.0–0.3)
Indirect Bilirubin: 0.5 mg/dL (ref 0.0–0.9)
Total Bilirubin: 0.7 mg/dL (ref 0.3–1.2)

## 2012-11-13 LAB — GLUCOSE, CAPILLARY: Glucose-Capillary: 169 mg/dL — ABNORMAL HIGH (ref 70–99)

## 2012-11-13 MED ORDER — ONDANSETRON HCL 4 MG PO TABS
4.0000 mg | ORAL_TABLET | Freq: Four times a day (QID) | ORAL | Status: DC | PRN
Start: 2012-11-13 — End: 2012-11-18

## 2012-11-13 MED ORDER — SODIUM CHLORIDE 0.9 % IJ SOLN: 9.0000 mL | INTRAMUSCULAR | Status: DC | PRN

## 2012-11-13 MED ORDER — DIPHENHYDRAMINE HCL 50 MG/ML IJ SOLN: 12.5000 mg | Freq: Four times a day (QID) | INTRAMUSCULAR | Status: DC | PRN

## 2012-11-13 MED ORDER — DIAZEPAM 5 MG/ML IJ SOLN
INTRAMUSCULAR | Status: AC
  Filled 2012-11-13: qty 2

## 2012-11-13 MED ORDER — ONDANSETRON HCL 4 MG/2ML IJ SOLN: 4.0000 mg | Freq: Four times a day (QID) | INTRAMUSCULAR | Status: DC | PRN

## 2012-11-13 MED ORDER — ENOXAPARIN SODIUM 40 MG/0.4ML ~~LOC~~ SOLN
40.0000 mg | SUBCUTANEOUS | Status: DC
  Administered 2012-11-13 – 2012-11-17 (×5): 40 mg via SUBCUTANEOUS
  Filled 2012-11-13 (×6): qty 0.4

## 2012-11-13 MED ORDER — DIPHENHYDRAMINE HCL 12.5 MG/5ML PO ELIX: 12.5000 mg | ORAL_SOLUTION | Freq: Four times a day (QID) | ORAL | Status: DC | PRN

## 2012-11-13 MED ORDER — PNEUMOCOCCAL VAC POLYVALENT 25 MCG/0.5ML IJ INJ
0.5000 mL | INJECTION | INTRAMUSCULAR | Status: AC
  Administered 2012-11-14: 0.5 mL via INTRAMUSCULAR
  Filled 2012-11-13 (×2): qty 0.5

## 2012-11-13 MED ORDER — MORPHINE SULFATE (PF) 1 MG/ML IV SOLN
INTRAVENOUS | Status: DC
  Administered 2012-11-13: 07:00:00 via INTRAVENOUS
  Administered 2012-11-13: 6 mg via INTRAVENOUS
  Administered 2012-11-13: 03:00:00 via INTRAVENOUS
  Filled 2012-11-13 (×2): qty 25

## 2012-11-13 MED ORDER — KETOROLAC TROMETHAMINE 30 MG/ML IJ SOLN
INTRAMUSCULAR | Status: AC
  Filled 2012-11-13: qty 1

## 2012-11-13 MED ORDER — HYDROMORPHONE HCL PF 1 MG/ML IJ SOLN
INTRAMUSCULAR | Status: AC
  Filled 2012-11-13: qty 1

## 2012-11-13 MED ORDER — LORAZEPAM 2 MG/ML IJ SOLN
0.5000 mg | INTRAMUSCULAR | Status: DC | PRN
  Administered 2012-11-13 – 2012-11-17 (×11): 0.5 mg via INTRAVENOUS
  Filled 2012-11-13 (×11): qty 1

## 2012-11-13 MED ORDER — BIOTENE DRY MOUTH MT LIQD
15.0000 mL | Freq: Two times a day (BID) | OROMUCOSAL | Status: DC
  Administered 2012-11-13 – 2012-11-17 (×9): 15 mL via OROMUCOSAL

## 2012-11-13 MED ORDER — CHLORHEXIDINE GLUCONATE 0.12 % MT SOLN
15.0000 mL | Freq: Two times a day (BID) | OROMUCOSAL | Status: DC
  Administered 2012-11-13 – 2012-11-17 (×6): 15 mL via OROMUCOSAL
  Filled 2012-11-13 (×11): qty 15

## 2012-11-13 MED ORDER — NICOTINE 21 MG/24HR TD PT24
21.0000 mg | MEDICATED_PATCH | Freq: Every day | TRANSDERMAL | Status: DC
  Administered 2012-11-13 – 2012-11-17 (×5): 21 mg via TRANSDERMAL
  Filled 2012-11-13 (×6): qty 1

## 2012-11-13 MED ORDER — HYDROMORPHONE 0.3 MG/ML IV SOLN
INTRAVENOUS | Status: DC
  Administered 2012-11-13: 2.79 mg via INTRAVENOUS
  Administered 2012-11-13: 08:00:00 via INTRAVENOUS
  Administered 2012-11-13: 0.999 mg via INTRAVENOUS
  Administered 2012-11-13: 2.99 mg via INTRAVENOUS
  Administered 2012-11-14: 2.19 mg via INTRAVENOUS
  Administered 2012-11-14: 0.399 mg via INTRAVENOUS
  Administered 2012-11-14: 3.39 mg via INTRAVENOUS
  Administered 2012-11-14: 1.99 mg via INTRAVENOUS
  Administered 2012-11-14: 1.79 mg via INTRAVENOUS
  Administered 2012-11-14: 1.39 mg via INTRAVENOUS
  Administered 2012-11-14: 3.59 mg via INTRAVENOUS
  Administered 2012-11-15: 1.39 mg via INTRAVENOUS
  Administered 2012-11-15: 0.999 mg via INTRAVENOUS
  Administered 2012-11-15: 0.339 mg via INTRAVENOUS
  Administered 2012-11-15: 2.39 mg via INTRAVENOUS
  Administered 2012-11-15: 1.59 mg via INTRAVENOUS
  Administered 2012-11-15: 07:00:00 via INTRAVENOUS
  Administered 2012-11-16: 0.99 mg via INTRAVENOUS
  Administered 2012-11-16: 2.19 mg via INTRAVENOUS
  Administered 2012-11-16: 3.99 mg via INTRAVENOUS
  Administered 2012-11-16: 3.19 mg via INTRAVENOUS
  Administered 2012-11-16: 2.79 mg via INTRAVENOUS
  Administered 2012-11-16: 04:00:00 via INTRAVENOUS
  Administered 2012-11-17: 1.39 mg via INTRAVENOUS
  Administered 2012-11-17: 0.6 mg via INTRAVENOUS
  Administered 2012-11-17: 0.7 mg via INTRAVENOUS
  Administered 2012-11-17: 1.59 mg via INTRAVENOUS
  Administered 2012-11-17: 1.19 mg via INTRAVENOUS
  Administered 2012-11-17: 1.99 mg via INTRAVENOUS
  Administered 2012-11-18: 0.9 mg via INTRAVENOUS
  Administered 2012-11-18: 1.99 mg via INTRAVENOUS
  Administered 2012-11-18: 03:00:00 via INTRAVENOUS
  Administered 2012-11-18: 0.399 mg via INTRAVENOUS
  Filled 2012-11-13 (×8): qty 25

## 2012-11-13 MED ORDER — FENTANYL CITRATE 0.05 MG/ML IJ SOLN
INTRAMUSCULAR | Status: AC
  Filled 2012-11-13: qty 2

## 2012-11-13 MED ORDER — POTASSIUM CHLORIDE IN NACL 20-0.9 MEQ/L-% IV SOLN
INTRAVENOUS | Status: DC
  Administered 2012-11-13: 125 mL via INTRAVENOUS
  Administered 2012-11-13 – 2012-11-17 (×10): via INTRAVENOUS
  Filled 2012-11-13 (×13): qty 1000

## 2012-11-13 MED ORDER — NALOXONE HCL 0.4 MG/ML IJ SOLN: 0.4000 mg | INTRAMUSCULAR | Status: DC | PRN

## 2012-11-13 MED ORDER — INSULIN ASPART 100 UNIT/ML ~~LOC~~ SOLN
0.0000 [IU] | SUBCUTANEOUS | Status: DC
  Administered 2012-11-13 (×2): 3 [IU] via SUBCUTANEOUS
  Administered 2012-11-13: 5 [IU] via SUBCUTANEOUS
  Administered 2012-11-13: 8 [IU] via SUBCUTANEOUS
  Administered 2012-11-13: 3 [IU] via SUBCUTANEOUS
  Administered 2012-11-14 (×2): 2 [IU] via SUBCUTANEOUS
  Administered 2012-11-14: 3 [IU] via SUBCUTANEOUS
  Administered 2012-11-14: 2 [IU] via SUBCUTANEOUS
  Administered 2012-11-14: 3 [IU] via SUBCUTANEOUS
  Administered 2012-11-14 – 2012-11-15 (×2): 2 [IU] via SUBCUTANEOUS
  Administered 2012-11-15: 3 [IU] via SUBCUTANEOUS
  Administered 2012-11-15 – 2012-11-16 (×5): 2 [IU] via SUBCUTANEOUS
  Administered 2012-11-16: 5 [IU] via SUBCUTANEOUS
  Administered 2012-11-16: 3 [IU] via SUBCUTANEOUS
  Administered 2012-11-16: 2 [IU] via SUBCUTANEOUS

## 2012-11-13 MED ORDER — PANTOPRAZOLE SODIUM 40 MG IV SOLR
40.0000 mg | Freq: Every day | INTRAVENOUS | Status: DC
  Administered 2012-11-13 – 2012-11-17 (×6): 40 mg via INTRAVENOUS
  Filled 2012-11-13 (×7): qty 40

## 2012-11-13 NOTE — Progress Notes (Signed)
Inpatient Diabetes Program Recommendations  AACE/ADA: New Consensus Statement on Inpatient Glycemic Control (2013)  Target Ranges:  Prepandial:   less than 140 mg/dL      Peak postprandial:   less than 180 mg/dL (1-2 hours)      Critically ill patients:  140 - 180 mg/dL   Reason for Visit: Hyperglycemia  Results for Frank Baird, Frank Baird (MRN [id number]) as of 11/13/2012 12:16  Ref. Range 11/13/2012 04:53 11/13/2012 07:39 11/13/2012 11:49  Glucose-Capillary Latest Range: 70-99 mg/dL 409 (H) 811 (H) 914 (H)    Inpatient Diabetes Program Recommendations Insulin - Basal: Add Lantus 10 units QHS Correction (SSI): If CBGs remain >180mg /dL, increase to resistant Q4 hours  Note: Will follow daily.  Thank you. Ailene Ards, RD, LDN, CDE Inpatient Diabetes Coordinator [phone]

## 2012-11-13 NOTE — Anesthesia Postprocedure Evaluation (Signed)
Anesthesia Post Note  Patient: Frank Baird  Procedure(s) Performed: Procedure(s) (LRB): DIAGNOSTIC LAPAROSCOPY, OPEN EXPLORATORY LAPAROTOMY, INCISIONAL BIOPSIES OF CALICFORM LIGAMENT, MESSENTERY, UMBILICAL LIGAMENT, BAND OF DUODENUM (N/A)  Anesthesia type: General  Patient location: PACU  Post pain: Pain level controlled  Post assessment: Post-op Vital signs reviewed  Last Vitals: BP 165/88  Pulse 78  Temp(Src) 37.2 C (Oral)  Resp 20  SpO2 100%  Post vital signs: Reviewed  Level of consciousness: sedated  Complications: No apparent anesthesia complications

## 2012-11-13 NOTE — Brief Op Note (Signed)
11/12/2012  12:00 AM  PATIENT:  Frank Baird  52 y.o. male  PRE-OPERATIVE DIAGNOSIS:  acute appendicitis  POST-OPERATIVE DIAGNOSIS:  CARCINMATOSIS  PROCEDURE:  Procedure(s): DIAGNOSTIC LAPAROSCOPY, OPEN EXPLORATORY LAPAROTOMY, INCISIONAL BIOPSIES OF CALICFORM LIGAMENT, MESSENTERY, UMBILICAL LIGAMENT, BAND OF DUODENUM (N/A)  SURGEON:  Surgeon(s) and Role:    * Lodema Pilot, DO - Primary  PHYSICIAN ASSISTANT:   ASSISTANTS: Gross   ANESTHESIA:   general  EBL:  Total I/O In: 2200 [I.V.:2200] Out: 225 [Urine:150; Blood:75]  BLOOD ADMINISTERED:none  DRAINS: (25F) Jackson-Pratt drain(s) with closed bulb suction in the RLQ   LOCAL MEDICATIONS USED:  MARCAINE    and LIDOCAINE   SPECIMEN:  Source of Specimen:  omental biopsy, falciform biopsy, abdominal wall bx, mesenteric biopsy, duodenal band, cytology  DISPOSITION OF SPECIMEN:  PATHOLOGY  COUNTS:  YES  TOURNIQUET:  * No tourniquets in log *  DICTATION: .Other Dictation: Dictation Number [id number]  PLAN OF CARE: Admit to inpatient   PATIENT DISPOSITION:  PACU - hemodynamically stable.   Delay start of Pharmacological VTE agent (>24hrs) due to surgical blood loss or risk of bleeding: no

## 2012-11-13 NOTE — Op Note (Signed)
Frank Baird, JOST NO.:  [id number]  MEDICAL RECORD NO.:  [id number]  LOCATION:  1537                         FACILITY:  [facility]  PHYSICIAN:  Lodema Pilot, MD       DATE OF BIRTH:  1961-01-27  DATE OF PROCEDURE:  11/12/2012 DATE OF DISCHARGE:                              OPERATIVE REPORT   PROCEDURE:  Diagnostic laparoscopy converted to exploratory laparotomy with biopsies of omentum and falciform ligament. The abdominal wall in mesenteric and abdominal drain placement.  PREOPERATIVE DIAGNOSIS:  Perforated appendicitis.  POSTOPERATIVE DIAGNOSIS:  Carcinomatosis.  SURGEON:  Lodema Pilot, MD.  ASSISTANT:  Dr. Michaell Cowing.  ANESTHESIA:  General endotracheal anesthesia.  ESTIMATED BLOOD LOSS:  75 mL.  FLUIDS:  Crystalloid 2 L.  SPECIMENS: 1. Greater omentum. 2. Band of duodenum. 3. Mesenteric biopsy of ileum. 4. Falciform ligament. 5. Right medial umbilical ligaments and anterior abdominal wall     biopsy.  COMPLICATIONS:  None apparent.  FINDINGS:  A large mass in the cecum with nodularity of the entire abdominal wall, possible malignancy in the entire small intestine, mesenteric, nodularity of the sigmoid colon, and retroperitoneum concerning for carcinomatosis. I do not see any purulence or abscess cavity, the appendix was stuck to the posterior wall of the cecum and the entire cecum was hard and inflamed concerning for tumor. No evidence of any obstruction. He did have a duodenum band crossing over the pyloric region and the removed, biopsied.  INDICATION FOR PROCEDURE:  This patient is a 52 year old male with several weeks of right flank and back pain, which over the 4 days has become abdominal pain. He went to his primary care physician and CT scan was ordered which was concerning for perforated appendix with abscess formation and he was referred to the emergency room for further evaluation and treatment.  OPERATIVE DETAILS:  This patient was  seen evaluated in the emergency room, risks and benefits of the procedure were discussed in lay terms. Informed consent was obtained. He was given therapeutic antibiotics and taken to the operating room, placed on the table in supine position. General endotracheal anesthesia was obtained and Foley catheter was placed. The left arm was tucked and was secured to the table across the chest. The abdomen was prepped and draped in standard surgical fashion. A procedure time-out was performed with all operative team members confirm proper patient, procedure. A supraumbilical midline incision was made in the skin, dissection carried down through subcutaneous tissue using blunt dissection. The abdominal fascia was elevated and sharply incised. The peritoneum entered bluntly. A 12-mm balloon port was placed at the umbilicus and a pneumoperitoneum was obtained. Laparoscope was introduced. There was no evidence of bleeding or bowel injury upon entry. The 5-mm right upper quadrant trocar was placed under direct visualization. He was noted to have nodularity of the small intestine, abdominal wall, diaphragm, concerning for carcinomatosis, and placed a lower midline 5-mm trocar in the left lower quadrant under direct visualization, and attempted to investigate the bowel in the area of the appendix, but this was all hard and the cecum and small intestine and terminal ileum were adhered to the retroperitoneum and lateral sidewall. I started to cut  this down using sharp dissection. This was all hard as though there it was tumor. We later decided to convert it to open in order to better evaluate and to help better evaluate for possible sources of this apparent carcinomatosis and to rule out perforation and abscess. Prior to opening, I did suction some fluid from the abdomen and was sent for cytology. I made a midline incision around the umbilicus and carried the dissection through the fascia. Yet, the  omentum which was adhered to the umbilical area likely from his prior umbilical hernia repair.  This came down easily using blunt dissection. After I opened the midline, the omentum appeared to have some stuttering and concerning for tumor. Using the Harmonic scalpel, I divided this omentum and sent this to Pathology for permanent section. He had a band of fibrous tissue over his pylorus which appeared to have some external compression, even though he did not have any symptoms concerning for obstruction. I palpated this, and this was nonpulsatile and this was divided. It appeared to be a fibrous band, 2-0 tie was placed on the stay side, and another band was divided and sent to Pathology. He had some palpable lymphadenopathy in the portal region and that his stomach was felt to be normal. An NG tube was placed. The spleen was soft. Most of the liver was soft without any palpable nodules, although laparoscopically I did see two small nodules on the capsule of the right liver. Yet, splitting of the diaphragm and the falciform ligament, I did biopsy the nodules of the falciform ligament as well as some on the anterior abdominal wall, and the right medial umbilical ligament, we mobilized the right colon and cecum by mobilizing the white line of Toldt. The cecum and appendix were all very hardened, felt as through as one large mass. It did not appear to be inflammatory mass. There was no obvious abscess cavity or purulence or infection, mobilized the terminal ileum from the pelvis stuck to the pelvic sidewall. This was all mobilized using sharp dissection and a small serosal tear was made without any full-thickness injury and this was oversewn with interrupted 2-0 silk Lembert sutures. We ran the entire small bowel and there did not appear to be any obstruction, although interestingly enough on the mesenteric border of the small intestine. He had continuous nodularity and lining with  what appeared to be tumor nodules. This was continued basically to the ligament of Treitz. We had contemplated resecting the cecum and appendix from right colon, but we do not see any area of the small bowel which we did use for anastomosis that was not involved with this apparent what appeared to be tumor and was felt that this was non-obstructed. It would be best to not perform an anastomosis through the tumor. I biopsied some of the mesenteric nodules and palpated the remainder of the abdomen. He has some nodularity at the rectum in the sigmoid region. Again, no obvious obstruction. The pancreas was difficult to palpate. He did appear to have some lymphadenopathy in the retroperitoneum as well, but no obvious primary source. At this time, we decided best to place drains in the area of the appendix in case this was a superimposed appendicitis and that we could treat with antibiotics and drainage, and we irrigated the abdomen and the irrigation returned clear, and the drain was exited through the right upper quadrant trocar site. Suture in place with a drain stitch. The fascia was then approximated with #1 looped PDS  x2, and the wound was irrigated with sterile saline solution and then the skin was approximated with skin staples. Dressing was applied.  All sponge, instrument counts were correct at the end of the case. The patient tolerated the procedure well without apparent complication.          ______________________________ Lodema Pilot, MD     BL/MEDQ  D:  11/12/2012  T:  11/13/2012  Job:  [id number]

## 2012-11-13 NOTE — Progress Notes (Signed)
Patient ID: Frank Baird, male   DOB: May 22, 1961, 52 y.o.   MRN: [id number] Pt pain not controlled with morphine PCA, pt reports that morphine has never really helped in past.  Changed to Dilaudid PCA reduced dose.  WHITE, ELIZABETH 8:04 AM 11/13/2012

## 2012-11-13 NOTE — Progress Notes (Signed)
1 Day Post-Op  Subjective: Pain better controlled this am  Objective: Vital signs in last 24 hours: Temp:  [98.5 F (36.9 C)-99.2 F (37.3 C)] 98.8 F (37.1 C) (05/14 0600) Pulse Rate:  [72-78] 72 (05/14 0600) Resp:  [15-22] 16 (05/14 0749) BP: (140-165)/(70-94) 144/70 mmHg (05/14 0600) SpO2:  [92 %-100 %] 94 % (05/14 0749) Weight:  [212 lb (96.163 kg)] 212 lb (96.163 kg) (05/13 1418) Last BM Date: 11/12/12  Intake/Output from previous day: 05/13 0701 - 05/14 0700 In: 2200 [I.V.:2200] Out: 1740 [Urine:1150; Emesis/NG output:350; Drains:165; Blood:75] Intake/Output this shift:    General appearance: alert, cooperative and no distress Resp: nonlabored Cardio: normal rate, regular GI: soft, incisional tenderness, ND, dressing intact, JP ss Extremities: SCD's bilat  Lab Results:   Recent Labs  11/12/12 1430 11/12/12 1850  WBC 11.6* 12.0*  HGB 14.8 14.7  HCT 44.1 42.3  PLT 266 257   BMET  Recent Labs  11/12/12 1430 11/12/12 1850  NA 132* 130*  K 3.9 3.9  CL 97 94*  CO2 26 23  GLUCOSE 181* 162*  BUN 8 7  CREATININE 0.72 0.66  CALCIUM 9.5 9.6   PT/INR No results found for this basename: LABPROT, INR,  in the last 72 hours ABG No results found for this basename: PHART, PCO2, PO2, HCO3,  in the last 72 hours  Studies/Results: Ct Abdomen Pelvis W Contrast  11/12/2012   *RADIOLOGY REPORT*  Clinical Data: Right flank pain fever, and guarding for 6 weeks.  CT ABDOMEN AND PELVIS WITH CONTRAST  Technique:  Multidetector CT imaging of the abdomen and pelvis was performed following the standard protocol during bolus administration of intravenous contrast.  Contrast: 30mL OMNIPAQUE IOHEXOL 300 MG/ML  SOLN, OMNIPAQUE IOHEXOL 300 MG/ML  SOLN  Comparison: CT abdomen pelvis 11/14/2011  Findings: The lung bases are clear.  There are reactive size right juxtadiaphragmatic lymph nodes. Additionally, there is a lymph node anterior to the pericardium in the midline that has  increased since prior CT, and measures 7 mm short axis.  There is a small amount of perihepatic ascites and there is some scattered fluid and/or stranding in the mesenteries of the right abdomen.  The appendix is dilated and has enhancing walls, measuring up to 10 mm diameter on image number 64.  Medial and adjacent to the base of the appendix and cecum is a fluid and gas collection consistent with an abscess.  This abscess approximately 4.1 x 4.4 cm axial dimension and 5.1 cm craniocaudal span.  There are reactive size lymph nodes in the right ileocolic mesentery. There are may be a tiny amount of free fluid in the right aspect of the dependent portion of the pelvis on image number 83.  No evidence of bowel obstruction.  No free intraperitoneal air is identified.  Mild diffuse fatty infiltration of the liver.  No focal hepatic lesion or biliary ductal dilatation.  Patient is status post cholecystectomy.  The spleen, adrenal glands, kidneys, and pancreas are within normal limits.  The abdominal aorta is normal caliber and contains scattered atherosclerotic calcification.  The urinary bladder prostate gland seminal vesicles are within normal limits.  No acute bony abnormality is identified.  IMPRESSION:  1.  Findings highly suggestive of acute appendicitis complicated by rupture and formation of a right lower quadrant abscess, with reactive adjacent lymphadenopathy. This study is made a call report.  2.  Small amount of perihepatic ascites may be due to appendiceal rupture, or may be reactive. 3.  Reactive size juxta-diaphragmatic lymph nodes.   Original Report Authenticated By: Britta Mccreedy, M.D.    Anti-infectives: Anti-infectives   Start     Dose/Rate Route Frequency Ordered Stop   11/12/12 1930  ciprofloxacin (CIPRO) IVPB 400 mg     400 mg 200 mL/hr over 60 Minutes Intravenous Every 12 hours 11/12/12 1917     11/12/12 1930  metroNIDAZOLE (FLAGYL) IVPB 500 mg     500 mg 100 mL/hr over 60 Minutes  Intravenous Every 8 hours 11/12/12 1917        Assessment/Plan: s/p Procedure(s): DIAGNOSTIC LAPAROSCOPY, OPEN EXPLORATORY LAPAROTOMY, INCISIONAL BIOPSIES OF CALICFORM LIGAMENT, MESSENTERY, UMBILICAL LIGAMENT, BAND OF DUODENUM (N/A) d/c foley I had a long discussion with the patient about the intraop findings and showed him the pictures.  I explained that this appears to be malignant but bx pending.  For now keep NG and likely remove tomorrow.  Continue abx and drain in case this is superimposed appendicitis.  LOS: 1 day    Lodema Pilot DAVID 11/13/2012

## 2012-11-13 NOTE — Care Management Note (Signed)
    Page 1 of 1   11/13/2012     11:40:14 AM   CARE MANAGEMENT NOTE 11/13/2012  Patient:  Frank Baird, Frank Baird   Account Number:  [id number]  Date Initiated:  11/13/2012  Documentation initiated by:  Lorenda Ishihara  Subjective/Objective Assessment:   52 yo male admitted s/p exploratory lap with biopsy. PTA lived at home with spouse.     Action/Plan:   Home when stable   Anticipated DC Date:  11/16/2012   Anticipated DC Plan:  HOME/SELF CARE      DC Planning Services  CM consult      Choice offered to / List presented to:             Status of service:  Completed, signed off Medicare Important Message given?   (If response is "NO", the following Medicare IM given date fields will be blank) Date Medicare IM given:   Date Additional Medicare IM given:    Discharge Disposition:  HOME/SELF CARE  Per UR Regulation:  Reviewed for med. necessity/level of care/duration of stay  If discussed at Long Length of Stay Meetings, dates discussed:    Comments:

## 2012-11-14 ENCOUNTER — Inpatient Hospital Stay (HOSPITAL_COMMUNITY): Payer: Federal, State, Local not specified - PPO

## 2012-11-14 DIAGNOSIS — C786 Secondary malignant neoplasm of retroperitoneum and peritoneum: Secondary | ICD-10-CM

## 2012-11-14 DIAGNOSIS — C269 Malignant neoplasm of ill-defined sites within the digestive system: Secondary | ICD-10-CM

## 2012-11-14 LAB — GLUCOSE, CAPILLARY
Glucose-Capillary: 129 mg/dL — ABNORMAL HIGH (ref 70–99)
Glucose-Capillary: 148 mg/dL — ABNORMAL HIGH (ref 70–99)
Glucose-Capillary: 123 mg/dL — ABNORMAL HIGH (ref 70–99)

## 2012-11-14 LAB — BASIC METABOLIC PANEL
BUN: 10 mg/dL (ref 6–23)
Chloride: 100 mEq/L (ref 96–112)
Creatinine, Ser: 0.75 mg/dL (ref 0.50–1.35)
Glucose, Bld: 133 mg/dL — ABNORMAL HIGH (ref 70–99)
Potassium: 3.7 mEq/L (ref 3.5–5.1)

## 2012-11-14 LAB — CBC
HCT: 41.8 % (ref 39.0–52.0)
Hemoglobin: 13.8 g/dL (ref 13.0–17.0)
MCHC: 33 g/dL (ref 30.0–36.0)
MCV: 87.8 fL (ref 78.0–100.0)
RDW: 13 % (ref 11.5–15.5)
WBC: 15.3 10*3/uL — ABNORMAL HIGH (ref 4.0–10.5)

## 2012-11-14 MED ORDER — OXYCODONE HCL 5 MG PO TABS
5.0000 mg | ORAL_TABLET | ORAL | Status: DC | PRN
  Administered 2012-11-14 – 2012-11-15 (×3): 5 mg via ORAL
  Filled 2012-11-14 (×3): qty 1

## 2012-11-14 NOTE — Consult Note (Signed)
[facility]  [phone]   HEMATOLOGY ONCOLOGY CONSULTATION   Frank Baird  DOB: 1960/08/09  MR#: [id number]  CSN#: [id number]    Requesting Physician: [facility]   Primary MD:   History of present illness:Carcinomatosis        52 y.o.  male admitted on on 11/12/2012 with recurrent right flank pain with low grade  Fever initially believed to be secondary to nephrolithiasis.Evidence of perforated appendicitis with a right lower quadrant abcess with reactive adjacent lymphadenopathy was noted on an outpatient CT of the abdomen and pelvis with contrast on 5/13. In addition, small amount of perihepatic ascites was seen. Reactive juxta diaphragmatic lymph nodes were noted.    He reports intermittent abdominal pain over the past year but has worsened recently. He reports seeing Dr. Elnoria Howard and being scheduled for colonoscopy.  The pain increased and an outpatient CT on 11/12/2012 was suggestive of a perforated appendicitis. He presented to the emergency room 11/12/2012 and Dr. Biagio Quint was consult at.  On 5/14,  He underwent  laparoscopy, open exploratory laparotomy. Incisional biopsies of falciform ligament, mesentery, umbilical ligament and band of duodenum.a large mass was noted at the cecum with nodularity the abdominal wall, small intestine mesentery, and retroperitoneum. No evidence of an infectious process. Biopsy report [id number] revealed involvement by metastatic adenocarcinoma with invasive implant formation. There is extensive lymphovascular and perineural invasion identified.  Immunophenotyping of the adenocarcinoma is pending.No tumor markers are available.    We were kindly requested to see the patient with recommendations.    Past medical history:      Past Medical History  Diagnosis Date  .  COPD    . Complication of anesthesia     difficulty waking up  . Arthritis   . GERD (gastroesophageal reflux disease)   . Diabetes mellitus   .  Hyperlipidemia   . Hypertension   [facility] Spotted Fever in 2007 and as a child. Cat Scratch Fever, remote. 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 Shoulder surgery  2012    right shoulder rotator cuff  . Nasal sinus surgery  2000  . Hernia repair      umbilical and right inguinal  . Elbow surgery      right elbow ligament  . Eye surgery      bilateral cataract surgery  . Cholecystectomy  09/26/2011    Procedure: LAPAROSCOPIC CHOLECYSTECTOMY WITH INTRAOPERATIVE CHOLANGIOGRAM;  Surgeon: Mariella Saa, MD;  Location: [facility] OR;  Service: General;  Laterality: N/A;  . Laparoscopic appendectomy N/A 11/12/2012    Procedure: DIAGNOSTIC LAPAROSCOPY, OPEN EXPLORATORY LAPAROTOMY, INCISIONAL BIOPSIES OF CALICFORM LIGAMENT, MESSENTERY, UMBILICAL LIGAMENT, BAND OF DUODENUM;  Surgeon: Lodema Pilot, DO;  Location: [facility] ORS;  Service: General;  Laterality: N/A;    Medications:   . antiseptic oral rinse  15 mL Mouth Rinse q12n4p  . chlorhexidine  15 mL Mouth Rinse BID  . ciprofloxacin  400 mg Intravenous Q12H  . enoxaparin (LOVENOX) injection  40 mg Subcutaneous Q24H  . HYDROmorphone PCA 0.3 mg/mL   Intravenous Q4H  . insulin aspart  0-15 Units Subcutaneous Q4H  . metronidazole  500 mg Intravenous Q8H  . nicotine  21 mg Transdermal Daily  . pantoprazole (PROTONIX) IV  40 mg Intravenous QHS      IHK:[id number], diphenhydrAMINE, LORazepam, naloxone, ondansetron (ZOFRAN) IV, ondansetron, oxyCODONE,  sodium chloride  Allergies:  Allergies  Allergen Reactions  . Bee Venom     Unsure of reaction, Stung as baby and almost died  . Codeine     Bad stomach cramps    . Penicillins Other (See Comments)    unknown    Family history:     Family History  Problem Relation Age of Onset  . Heart disease Mother   Father died with prostate  cancer PAternal Grandfather and maternal grandmother died with cancer of unknown type Paternal grandmother died with breast cancer One brother, alive had testicular cancer                                   Social history: Married, one biological child in good health. Originally from [place]. Works as an Retail banker. Exposed to multiple toxic agents. Smoked 1 ppd since age 89 until 3-4 years ago. Recovering alcoholic. Consumed ETOH heavily for 18-20 years quit in 2007. No recreational drugs. No risk factor for HIV or hepatitis.   Review of systems:  See HPI for significant positives.  Constitutional: Positivefor weight loss, from 270 to 213 lbs in one year, some of it intentionally. Positive  for  Intermittent fevers, no night sweats, or chills  Eyes: Negative for blurred vision. Difficulty "focusing ". Respiratory: Negative for cough or  Hemoptysis,Negative for  shortness of breath.  Cardiovascular: Negative for chest pain. Negative for palpitations.  GI:  As per HPI.  ZO:[id number] for hematuria. No loss of urinary control. No Urinary retention.  Skin: Negative for itching. No rash. No petechia. No bruising.  Neurological: Migraine headache a few weeks ago. No motor or sensory deficits.No confusion.     Physical exam:      Filed Vitals:   11/14/12 1400  BP: 154/65  Pulse: 80  Temp: 99 F (37.2 C)  Resp: 10     Body mass index is 31.29 kg/(m^2).   General: 52 y.o. male  in no acute distress A. and O. x3  well-developed and well-nourished.  HEENT: Normocephalic, atraumatic, PERRLA. Oral cavity without thrush or lesions. No upper teeth. Neck supple. no thyromegaly Lungs bilateral wheezes. Breasts: not examined. Cardiac regular rate and rhythm normal S1-S2, no murmur , rubs or gallops Abdomen soft , dressing over midline wound, JP drain with some serosanguineous fluid.  bowel sounds x4. No HSM. No masses palpable.  GU/rectal: Testes without mass Extremities no clubbing,  no  cyanosis or edema. No bruising or petechial rash Multiple tatoos Musculoskeletal: no spinal tenderness.  Neuro: Non Focal Lymph nodes: No cervical, supraclavicular, axillary, or inguinal nodes   Lab results:       CBC  Recent Labs Lab 11/12/12 1430 11/12/12 1850 11/14/12 0420  WBC 11.6* 12.0* 15.3*  HGB 14.8 14.7 13.8  HCT 44.1 42.3 41.8  PLT 266 257 223  MCV 87.3 87.0 87.8  MCH 29.3 30.2 29.0  MCHC 33.6 34.8 33.0  RDW 13.9 12.8 13.0  LYMPHSABS 1.4 1.6  --   MONOABS 1.2* 1.3*  --   EOSABS 0.1 0.1  --   BASOSABS 0.0 0.0  --     Chemistries   Recent Labs Lab 11/08/12 1433 11/12/12 1430 11/12/12 1850 11/14/12 0420  NA 136 132* 130* 137  K 4.3 3.9 3.9 3.7  CL 100 97 94* 100  CO2 28 26 23 29   GLUCOSE 221* 181* 162* 133*  BUN 13 8 7  10  CREATININE 0.94 0.72 0.66 0.75  CALCIUM 9.7 9.5 9.6 8.9    Studies:       Ct Abdomen Pelvis W Contrast  11/12/2012   *RADIOLOGY REPORT*  Clinical Data: Right flank pain fever, and guarding for 6 weeks.  CT ABDOMEN AND PELVIS WITH CONTRAST  Technique:  Multidetector CT imaging of the abdomen and pelvis was performed following the standard protocol during bolus administration of intravenous contrast.  Contrast: 30mL OMNIPAQUE IOHEXOL 300 MG/ML  SOLN, OMNIPAQUE IOHEXOL 300 MG/ML  SOLN  Comparison: CT abdomen pelvis 11/14/2011  Findings: The lung bases are clear.  There are reactive size right juxtadiaphragmatic lymph nodes. Additionally, there is a lymph node anterior to the pericardium in the midline that has increased since prior CT, and measures 7 mm short axis.  There is a small amount of perihepatic ascites and there is some scattered fluid and/or stranding in the mesenteries of the right abdomen.  The appendix is dilated and has enhancing walls, measuring up to 10 mm diameter on image number 64.  Medial and adjacent to the base of the appendix and cecum is a fluid and gas collection consistent with an abscess.  This abscess  approximately 4.1 x 4.4 cm axial dimension and 5.1 cm craniocaudal span.  There are reactive size lymph nodes in the right ileocolic mesentery. There are may be a tiny amount of free fluid in the right aspect of the dependent portion of the pelvis on image number 83.  No evidence of bowel obstruction.  No free intraperitoneal air is identified.  Mild diffuse fatty infiltration of the liver.  No focal hepatic lesion or biliary ductal dilatation.  Patient is status post cholecystectomy.  The spleen, adrenal glands, kidneys, and pancreas are within normal limits.  The abdominal aorta is normal caliber and contains scattered atherosclerotic calcification.  The urinary bladder prostate gland seminal vesicles are within normal limits.  No acute bony abnormality is identified.  IMPRESSION:  1.  Findings highly suggestive of acute appendicitis complicated by rupture and formation of a right lower quadrant abscess, with reactive adjacent lymphadenopathy. This study is made a call report.  2.  Small amount of perihepatic ascites may be due to appendiceal rupture, or may be reactive. 3.  Reactive size juxta-diaphragmatic lymph nodes.   Original Report Authenticated By: Britta Mccreedy, M.D.   Dg Chest Port 1 View  11/14/2012   *RADIOLOGY REPORT*  Clinical Data: Hypoxia with chronic cough and history of chronic bronchitis.  Hypertension.  Smoker with diabetes  PORTABLE CHEST - 1 VIEW  Comparison: 09/25/2011  Findings: Heart size is prominent but felt likely to be within normal limits given the projection.  A normal mediastinal contour is seen.  The lung fields are notable for linear atelectasis at both lung bases.  No focal infiltrates or signs of congestive failure are seen.  No pleural fluid or significant peribronchial cuffing is noted.  Bony structures appear intact.  IMPRESSION: Bibasilar subsegmental linear atelectasis.  Otherwise stable   Original Report Authenticated By: Rhodia Albright, M.D.    Assessmnent/Plan:  As  per Dr. Truett Perna. Addendum to be written.  Marcos Eke, PA-C 11/14/2012   Frank Baird was interviewed and examined. Impression: 1. 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 Cecal/appendix mass noted at the time of exploratory laparotomy 11/13/2012 3. COPD  He has been diagnosed with abdominal carcinomatosis. This appears to be secondary to a cecal/appendiceal primary. I discussed the diagnosis and treatment options with Frank Baird and his wife. He understand the diagnosis of metastatic disease. We will followup on the final immunohistochemical stains to confirm the diagnosis of a gastrointestinal primary.  Frank Baird appears to be a candidate for systemic therapy. He may also be a candidate for hyperthermic intraperitoneal chemotherapy with debulking surgery. We will consider a referral to Dr. Lenis Noon at [facility]. He may be excluded from this procedure if he is confirmed to have retroperitoneal tumor or tumor outside of the abdomen.  Recommendations: 1. Staging CT of the chest 2. Continue postop care per Dr. Johna Sheriff 3. Outpatient followup will be scheduled at the cancer Center. Please call oncology as needed while he is in the hospital. I will check on him 11/18/2012 if he remains in the hospital.

## 2012-11-14 NOTE — Progress Notes (Signed)
Patient ID: Frank Baird, male   DOB: [birth year], 52 y.o.   MRN: [id number] 2 Days Post-Op  Subjective: Pain control still an issue, also desating when walking or moving, having lots of pain when moving, denies flatus but not having nausea or vomiting, feels like he needs to have a BM. Feels very thirsty.  Getting very SOB with short ambulation  Objective: Vital signs in last 24 hours: Temp:  [98.4 F (36.9 C)-99.1 F (37.3 C)] 98.7 F (37.1 C) (05/15 0517) Pulse Rate:  [73-86] 80 (05/15 0517) Resp:  [12-19] 16 (05/15 0900) BP: (144-158)/(71-89) 154/79 mmHg (05/15 0517) SpO2:  [93 %-95 %] 93 % (05/15 0900) Weight:  [212 lb (96.163 kg)] 212 lb (96.163 kg) (05/15 0000) Last BM Date: 11/12/12  Intake/Output from previous day: 05/14 0701 - 05/15 0700 In: 3632.1 [I.V.:3202.1; NG/GT:30; IV Piggyback:400] Out: 3468 [Urine:2200; Emesis/NG output:1150; Drains:118] Intake/Output this shift: Total I/O In: 458.3 [I.V.:458.3] Out: 280 [Urine:150; Emesis/NG output:100; Drains:30]  General: alert, anxious, in pain at times, some shortness of breath after walking Resp: labored from walking, decreased in bases, some wheezing and rhonchi Cardio: normal rate, regular GI: soft, incisional tenderness, incision with some bloody oozing, JP ss Extremities: no edema  Lab Results:   Recent Labs  11/12/12 1850 11/14/12 0420  WBC 12.0* 15.3*  HGB 14.7 13.8  HCT 42.3 41.8  PLT 257 223   BMET  Recent Labs  11/12/12 1850 11/14/12 0420  NA 130* 137  K 3.9 3.7  CL 94* 100  CO2 23 29  GLUCOSE 162* 133*  BUN 7 10  CREATININE 0.66 0.75  CALCIUM 9.6 8.9   PT/INR No results found for this basename: LABPROT, INR,  in the last 72 hours ABG No results found for this basename: PHART, PCO2, PO2, HCO3,  in the last 72 hours  Studies/Results: Ct Abdomen Pelvis W Contrast  11/12/2012   *RADIOLOGY REPORT*  Clinical Data: Right flank pain fever, and guarding for 6 weeks.  CT ABDOMEN AND PELVIS WITH  CONTRAST  Technique:  Multidetector CT imaging of the abdomen and pelvis was performed following the standard protocol during bolus administration of intravenous contrast.  Contrast: 30mL OMNIPAQUE IOHEXOL 300 MG/ML  SOLN, OMNIPAQUE IOHEXOL 300 MG/ML  SOLN  Comparison: CT abdomen pelvis 11/14/2011  Findings: The lung bases are clear.  There are reactive size right juxtadiaphragmatic lymph nodes. Additionally, there is a lymph node anterior to the pericardium in the midline that has increased since prior CT, and measures 7 mm short axis.  There is a small amount of perihepatic ascites and there is some scattered fluid and/or stranding in the mesenteries of the right abdomen.  The appendix is dilated and has enhancing walls, measuring up to 10 mm diameter on image number 64.  Medial and adjacent to the base of the appendix and cecum is a fluid and gas collection consistent with an abscess.  This abscess approximately 4.1 x 4.4 cm axial dimension and 5.1 cm craniocaudal span.  There are reactive size lymph nodes in the right ileocolic mesentery. There are may be a tiny amount of free fluid in the right aspect of the dependent portion of the pelvis on image number 83.  No evidence of bowel obstruction.  No free intraperitoneal air is identified.  Mild diffuse fatty infiltration of the liver.  No focal hepatic lesion or biliary ductal dilatation.  Patient is status post cholecystectomy.  The spleen, adrenal glands, kidneys, and pancreas are within normal limits.  The abdominal aorta is normal caliber and contains scattered atherosclerotic calcification.  The urinary bladder prostate gland seminal vesicles are within normal limits.  No acute bony abnormality is identified.  IMPRESSION:  1.  Findings highly suggestive of acute appendicitis complicated by rupture and formation of a right lower quadrant abscess, with reactive adjacent lymphadenopathy. This study is made a call report.  2.  Small amount of perihepatic  ascites may be due to appendiceal rupture, or may be reactive. 3.  Reactive size juxta-diaphragmatic lymph nodes.   Original Report Authenticated By: Britta Mccreedy, M.D.    Anti-infectives: Anti-infectives   Start     Dose/Rate Route Frequency Ordered Stop   11/12/12 1930  ciprofloxacin (CIPRO) IVPB 400 mg     400 mg 200 mL/hr over 60 Minutes Intravenous Every 12 hours 11/12/12 1917     11/12/12 1930  metroNIDAZOLE (FLAGYL) IVPB 500 mg     500 mg 100 mL/hr over 60 Minutes Intravenous Every 8 hours 11/12/12 1917        Assessment/Plan: 1. POD#2-Diagnostic laparoscopy converted to exploratory laparotomy with biopsies of omentum and falciform ligament. The abdominal wall in mesenteric and abdominal drain placement: I removed his NGT, will allow him to have limited sips of water and ice chips, will add oxycodone in small amounts with the reduced dose dilaudid PCA to see if we can get better pain control.  If unable to tolerate POs then will change him to full dose PCA.  Abd seems benign at this time.  Dressing changes daily.  Awaiting biopsies results and oncology consult.   2. Exertional hypoxia/SOA: smoker, but lungs with decreased breath sounds and wheezes, will check CXR today to ensure no underlying process other then smokers lungs/atelectasis.    LOS: 2 days    WHITE, ELIZABETH 11/14/2012

## 2012-11-14 NOTE — Progress Notes (Signed)
Biopsy has shown adenocarcinoma most likely appendiceal primary. Have asked oncology to evaluate.

## 2012-11-15 LAB — GLUCOSE, CAPILLARY
Glucose-Capillary: 115 mg/dL — ABNORMAL HIGH (ref 70–99)
Glucose-Capillary: 121 mg/dL — ABNORMAL HIGH (ref 70–99)
Glucose-Capillary: 121 mg/dL — ABNORMAL HIGH (ref 70–99)
Glucose-Capillary: 128 mg/dL — ABNORMAL HIGH (ref 70–99)

## 2012-11-15 LAB — BASIC METABOLIC PANEL
BUN: 11 mg/dL (ref 6–23)
Chloride: 98 mEq/L (ref 96–112)
Glucose, Bld: 129 mg/dL — ABNORMAL HIGH (ref 70–99)
Potassium: 4.1 mEq/L (ref 3.5–5.1)

## 2012-11-15 LAB — CBC
HCT: 39.7 % (ref 39.0–52.0)
Hemoglobin: 13.1 g/dL (ref 13.0–17.0)
MCHC: 33 g/dL (ref 30.0–36.0)
MCV: 89 fL (ref 78.0–100.0)
WBC: 12.3 10*3/uL — ABNORMAL HIGH (ref 4.0–10.5)

## 2012-11-15 MED ORDER — OXYCODONE HCL 5 MG PO TABS
5.0000 mg | ORAL_TABLET | ORAL | Status: DC | PRN
  Administered 2012-11-15 – 2012-11-18 (×18): 15 mg via ORAL
  Filled 2012-11-15 (×17): qty 3
  Filled 2012-11-15: qty 1
  Filled 2012-11-15 (×2): qty 3

## 2012-11-15 NOTE — Progress Notes (Signed)
Patient interviewed and examined, agree with PA note above.  Mariella Saa MD, FACS  11/15/2012 3:48 PM

## 2012-11-15 NOTE — Progress Notes (Signed)
Patient ID: Frank Baird, male   DOB: August 13, 1960, 52 y.o.   MRN: [id number] 3 Days Post-Op  Subjective: Still having pain control issues, moving more now though, breathing much better, tolerating sips well and passing lots of gas, denies n/v.  Objective: Vital signs in last 24 hours: Temp:  [98.7 F (37.1 C)-99.4 F (37.4 C)] 99.4 F (37.4 C) (05/16 0500) Pulse Rate:  [71-80] 71 (05/16 0500) Resp:  [10-21] 14 (05/16 0716) BP: (139-154)/(60-85) 152/85 mmHg (05/16 0500) SpO2:  [93 %-97 %] 95 % (05/16 0716) Last BM Date: 11/12/12  Intake/Output from previous day: 05/15 0701 - 05/16 0700 In: 958.3 [I.V.:958.3] Out: 1470 [Urine:1325; Emesis/NG output:100; Drains:45] Intake/Output this shift: Total I/O In: 240 [P.O.:240] Out: -   General: alert, anxious, in pain at times, NAD Resp: decreased in bases GI: soft, incisional tenderness, JP ss Extremities: no edema  Lab Results:   Recent Labs  11/14/12 0420 11/15/12 0409  WBC 15.3* 12.3*  HGB 13.8 13.1  HCT 41.8 39.7  PLT 223 219   BMET  Recent Labs  11/14/12 0420 11/15/12 0409  NA 137 134*  K 3.7 4.1  CL 100 98  CO2 29 28  GLUCOSE 133* 129*  BUN 10 11  CREATININE 0.75 0.97  CALCIUM 8.9 8.9   PT/INR No results found for this basename: LABPROT, INR,  in the last 72 hours ABG No results found for this basename: PHART, PCO2, PO2, HCO3,  in the last 72 hours  Studies/Results: Dg Chest Port 1 View  11/14/2012   *RADIOLOGY REPORT*  Clinical Data: Hypoxia with chronic cough and history of chronic bronchitis.  Hypertension.  Smoker with diabetes  PORTABLE CHEST - 1 VIEW  Comparison: 09/25/2011  Findings: Heart size is prominent but felt likely to be within normal limits given the projection.  A normal mediastinal contour is seen.  The lung fields are notable for linear atelectasis at both lung bases.  No focal infiltrates or signs of congestive failure are seen.  No pleural fluid or significant peribronchial cuffing is  noted.  Bony structures appear intact.  IMPRESSION: Bibasilar subsegmental linear atelectasis.  Otherwise stable   Original Report Authenticated By: Rhodia Albright, M.D.    Anti-infectives: Anti-infectives   Start     Dose/Rate Route Frequency Ordered Stop   11/12/12 1930  ciprofloxacin (CIPRO) IVPB 400 mg     400 mg 200 mL/hr over 60 Minutes Intravenous Every 12 hours 11/12/12 1917     11/12/12 1930  metroNIDAZOLE (FLAGYL) IVPB 500 mg     500 mg 100 mL/hr over 60 Minutes Intravenous Every 8 hours 11/12/12 1917        Assessment/Plan: 1. POD#3-Diagnostic laparoscopy converted to exploratory laparotomy with biopsies of omentum and falciform ligament. The abdominal wall in mesenteric and abdominal drain placement: no problems with NGT out and having flatus so will start clears and advance to fulls as tolerated, will add oxycodone 5-15mg  q3 hrs prn and try to get a plateau of pain control for him and be able to start weaning the PCA, oncology has seen the patient and will follow up with him as outpatient.  Continue ambulation    2. Exertional hypoxia/SOA: smoker, atelectasis yesterday, IS, up walking.    LOS: 3 days    WHITE, ELIZABETH 11/15/2012

## 2012-11-16 LAB — GLUCOSE, CAPILLARY
Glucose-Capillary: 122 mg/dL — ABNORMAL HIGH (ref 70–99)
Glucose-Capillary: 134 mg/dL — ABNORMAL HIGH (ref 70–99)
Glucose-Capillary: 211 mg/dL — ABNORMAL HIGH (ref 70–99)
Glucose-Capillary: 151 mg/dL — ABNORMAL HIGH (ref 70–99)

## 2012-11-16 MED ORDER — INSULIN ASPART 100 UNIT/ML ~~LOC~~ SOLN
0.0000 [IU] | Freq: Three times a day (TID) | SUBCUTANEOUS | Status: DC
  Administered 2012-11-17: 2 [IU] via SUBCUTANEOUS
  Administered 2012-11-17: 3 [IU] via SUBCUTANEOUS
  Administered 2012-11-17: 2 [IU] via SUBCUTANEOUS
  Administered 2012-11-18: 3 [IU] via SUBCUTANEOUS

## 2012-11-16 NOTE — Progress Notes (Signed)
Patient ID: Frank Baird, male   DOB: 11/03/1960, 52 y.o.   MRN: [id number] 4 Days Post-Op  Subjective: Still quite a lot of incisional pain particularly when moving around. Still requiring IV meds. He is tolerating his regular diet without nausea. Has had flatus but no bowel movement.  Objective: Vital signs in last 24 hours: Temp:  [98.2 F (36.8 C)-99.9 F (37.7 C)] 98.5 F (36.9 C) (05/17 0626) Pulse Rate:  [52-97] 90 (05/17 0626) Resp:  [16-20] 20 (05/17 0800) BP: (136-170)/(73-92) 165/92 mmHg (05/17 0626) SpO2:  [94 %-99 %] 95 % (05/17 0800) FiO2 (%):  [23 %-39 %] 38 % (05/17 0357) Last BM Date: 11/12/12  Intake/Output from previous day: 05/16 0701 - 05/17 0700 In: 5708.8 [P.O.:800; I.V.:4108.8; IV Piggyback:800] Out: 1915 [Urine:1700; Drains:215] Intake/Output this shift:    General appearance: alert, cooperative and no distress GI: mild diffuse tenderness. Mostly around the incision. Mild distention. JP drain with serous drainage. Incision/Wound: clean and dry without evidence of infection  Lab Results:   Recent Labs  11/14/12 0420 11/15/12 0409  WBC 15.3* 12.3*  HGB 13.8 13.1  HCT 41.8 39.7  PLT 223 219   BMET  Recent Labs  11/14/12 0420 11/15/12 0409  NA 137 134*  K 3.7 4.1  CL 100 98  CO2 29 28  GLUCOSE 133* 129*  BUN 10 11  CREATININE 0.75 0.97  CALCIUM 8.9 8.9     Studies/Results: Dg Chest Port 1 View  11/14/2012   *RADIOLOGY REPORT*  Clinical Data: Hypoxia with chronic cough and history of chronic bronchitis.  Hypertension.  Smoker with diabetes  PORTABLE CHEST - 1 VIEW  Comparison: 09/25/2011  Findings: Heart size is prominent but felt likely to be within normal limits given the projection.  A normal mediastinal contour is seen.  The lung fields are notable for linear atelectasis at both lung bases.  No focal infiltrates or signs of congestive failure are seen.  No pleural fluid or significant peribronchial cuffing is noted.  Bony structures  appear intact.  IMPRESSION: Bibasilar subsegmental linear atelectasis.  Otherwise stable   Original Report Authenticated By: Rhodia Albright, M.D.    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 Not ready for discharge due to pain control issues and awaiting return of GI function.     LOS: 4 days    Tajon Moring T 11/16/2012

## 2012-11-17 LAB — CBC
MCHC: 33.1 g/dL (ref 30.0–36.0)
MCV: 87.6 fL (ref 78.0–100.0)
Platelets: 262 10*3/uL (ref 150–400)
RDW: 13 % (ref 11.5–15.5)
WBC: 9.3 10*3/uL (ref 4.0–10.5)

## 2012-11-17 LAB — GLUCOSE, CAPILLARY: Glucose-Capillary: 183 mg/dL — ABNORMAL HIGH (ref 70–99)

## 2012-11-17 LAB — BASIC METABOLIC PANEL
Chloride: 97 mEq/L (ref 96–112)
Creatinine, Ser: 0.81 mg/dL (ref 0.50–1.35)
GFR calc Af Amer: >90 mL/min (ref >90)
GFR calc non Af Amer: >90 mL/min (ref >90)

## 2012-11-17 MED ORDER — LORAZEPAM 1 MG PO TABS
2.0000 mg | ORAL_TABLET | ORAL | Status: DC | PRN
  Administered 2012-11-17 (×2): 2 mg via ORAL
  Filled 2012-11-17 (×3): qty 2

## 2012-11-17 MED ORDER — FENTANYL 25 MCG/HR TD PT72
25.0000 ug | MEDICATED_PATCH | TRANSDERMAL | Status: DC
  Administered 2012-11-17: 25 ug via TRANSDERMAL
  Filled 2012-11-17: qty 1

## 2012-11-17 NOTE — Progress Notes (Signed)
Patient ID: Frank Baird, male   DOB: 1960-09-07, 52 y.o.   MRN: [id number] Patient ID: Frank Baird, male   DOB: December 25, 1960, 52 y.o.   MRN: [id number] 5 Days Post-Op  Subjective: Still quite a lot of  pain particularly when moving around, seems to be more internal abdominal pain and incisional. Still requiring IV meds. He is tolerating his regular diet without nausea. Has had flatus and bowel movement  Objective: Vital signs in last 24 hours: Temp:  [97.9 F (36.6 C)-98.7 F (37.1 C)] 98.7 F (37.1 C) (05/18 0603) Pulse Rate:  [69-72] 69 (05/18 0603) Resp:  [13-20] 14 (05/18 0722) BP: (118-145)/(66-78) 145/75 mmHg (05/18 0603) SpO2:  [93 %-98 %] 96 % (05/18 0603) Last BM Date: 11/16/12  Intake/Output from previous day: 05/17 0701 - 05/18 0700 In: 3671.7 [P.O.:600; I.V.:2371.7; IV Piggyback:700] Out: 3046 [Urine:2225; Drains:820; Stool:1] Intake/Output this shift:    General appearance: alert, cooperative and no distress GI: mild diffuse tenderness. Mostly around the incision. Mild distention. JP drain with serous drainage. Incision/Wound: clean and dry without evidence of infection  Lab Results:   Recent Labs  11/15/12 0409 11/17/12 0446  WBC 12.3* 9.3  HGB 13.1 12.9*  HCT 39.7 39.0  PLT 219 262   BMET  Recent Labs  11/15/12 0409 11/17/12 0446  NA 134* 129*  K 4.1 3.9  CL 98 97  CO2 28 26  GLUCOSE 129* 152*  BUN 11 6  CREATININE 0.97 0.81  CALCIUM 8.9 8.7     Studies/Results: No results found.  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 Not ready for discharge due to pain control issues. Will add fentanyl patch in an effort to get him off the PCA in preparation for discharge. Will order CT of the chest as requested by oncology for tomorrow. Hopefully can go home tomorrow if his pain is a little better controlled.   LOS: 5 days    Jamy Cleckler T 11/17/2012

## 2012-11-18 ENCOUNTER — Inpatient Hospital Stay (HOSPITAL_COMMUNITY): Payer: Federal, State, Local not specified - PPO

## 2012-11-18 ENCOUNTER — Encounter (HOSPITAL_COMMUNITY): Payer: Self-pay | Admitting: Radiology

## 2012-11-18 ENCOUNTER — Telehealth: Payer: Self-pay | Admitting: *Deleted

## 2012-11-18 MED ORDER — LORAZEPAM 2 MG PO TABS: 2.0000 mg | ORAL_TABLET | ORAL | Status: DC | PRN

## 2012-11-18 MED ORDER — CIPROFLOXACIN HCL 500 MG PO TABS: 500.0000 mg | ORAL_TABLET | Freq: Two times a day (BID) | ORAL | Status: AC

## 2012-11-18 MED ORDER — OXYCODONE HCL 10 MG PO TABS: 10.0000 mg | ORAL_TABLET | ORAL | Status: DC | PRN

## 2012-11-18 MED ORDER — METRONIDAZOLE 500 MG PO TABS: 500.0000 mg | ORAL_TABLET | Freq: Three times a day (TID) | ORAL | Status: AC

## 2012-11-18 MED ORDER — HYDROMORPHONE HCL PF 1 MG/ML IJ SOLN: 1.0000 mg | INTRAMUSCULAR | Status: DC | PRN

## 2012-11-18 MED ORDER — OXYCODONE HCL 5 MG PO TABS
10.0000 mg | ORAL_TABLET | ORAL | Status: DC | PRN
  Filled 2012-11-18: qty 3

## 2012-11-18 MED ORDER — IOHEXOL 300 MG/ML  SOLN
80.0000 mL | Freq: Once | INTRAMUSCULAR | Status: AC | PRN
  Administered 2012-11-18: 80 mL via INTRAVENOUS

## 2012-11-18 MED ORDER — FENTANYL 25 MCG/HR TD PT72: 1.0000 | MEDICATED_PATCH | TRANSDERMAL | Status: DC

## 2012-11-18 NOTE — Discharge Summary (Signed)
Physician Discharge Summary  Frank Baird WGN:[id number] DOB: 25-Apr-1961 DOA: 11/12/2012  PCP: Frank Grosser, MD  Consultation:  Dr. Ladene Artist, MD(Oncology)  Admit date: 11/12/2012 Discharge date: 11/18/2012  Recommendations for Outpatient Follow-up:  Post OP nurse visit  Dr. Johna Sheriff Dr. Truett Perna   Discharge Diagnoses:  1. Perforated Appendicitis 2. Carcinomatosis 3. Diabetes mellitus   Surgical Procedure: Exploratory laparotomy with biopsies of omentum and falciform ligament with drain placement.    Discharge Condition: guarded Disposition: discharge home  Diet recommendation: carb modified   Filed Weights   11/14/12 0000  Weight: 212 lb (96.163 kg)     Filed Vitals:   11/18/12 0800  BP:   Pulse:   Temp:   Resp: 16     Hospital Course:  [name] presented to [facility] ED for evaluation of perforated appendicitis.  He underwent a diagnostic laparotomy which was changed to exploratory laparotomy.  He was found to have carcinomatosis.  Post operatively, he was treated with PCA dilaudid.  Oncology was consulted.  He underwent a CT of chest for staging purposes which showed suspicion for metastatic disease.  The JP drain continued to put out moderate amount of serous fluid which was secondary to ascites.  He was tolerating a good diet.  PCA weaned off, fentanyl patch started which he tolerated well.  His vital signs remained stable.  His laboratory evaluation was stable.  He was treated with iv cipro/flagyl.  On POD #5 he was felt stable to go home.  Pt. Was tearful, but stated he would like to go home and see his grandchildren.  His outpatient appointments will be scheduled.  We discussed warning signs that warrant immediate attention.  He was provided with pain medication and anxi-anxiety medication.  We discussed potential adverse effects of combining such medication.    Dr. Truett Perna recommended upper endoscopy.  General appearance: alert and oriented.   Appears anxious. VSS. Afebrile.  Resp: clear to auscultation bilaterally  Cardio: S1S1 RRR without murmurs or gallops. No edema. GI: round, firm.  Midline incision staples are intact, edges are approximated. Pulses: +2 bilateral distal pulses without cyanosis  Neurologic: Mental status: Alert, oriented, thought content appropriate  Psychiatric: anxious   Discharge Instructions     Medication List    STOP taking these medications       ALEVE 220 MG Caps  Generic drug:  Naproxen Sodium     oxyCODONE-acetaminophen 10-325 MG per tablet  Commonly known as:  PERCOCET      TAKE these medications       celecoxib 200 MG capsule  Commonly known as:  CELEBREX  Take 200 mg by mouth 2 (two) times daily. Takes when can afford     ciprofloxacin 500 MG tablet  Commonly known as:  CIPRO  Take 1 tablet (500 mg total) by mouth 2 (two) times daily.     fentaNYL 25 MCG/HR  Commonly known as:  DURAGESIC - dosed mcg/hr  Place 1 patch (25 mcg total) onto the skin every 3 (three) days.     hyoscyamine 0.375 MG 12 hr tablet  Commonly known as:  LEVBID  Take 0.375 mg by mouth every 12 (twelve) hours as needed for cramping.     lisinopril 20 MG tablet  Commonly known as:  PRINIVIL,ZESTRIL  Take 20 mg by mouth daily.     LORazepam 2 MG tablet  Commonly known as:  ATIVAN  Take 1 tablet (2 mg total) by mouth every 4 (four) hours as  needed for anxiety.     metroNIDAZOLE 500 MG tablet  Commonly known as:  FLAGYL  Take 1 tablet (500 mg total) by mouth 3 (three) times daily.     NEXIUM 40 MG capsule  Generic drug:  esomeprazole  Take 40 mg by mouth daily before breakfast.     Oxycodone HCl 10 MG Tabs  Take 1-2 tablets (10-20 mg total) by mouth every 4 (four) hours as needed.     pioglitazone 30 MG tablet  Commonly known as:  ACTOS  Take 30 mg by mouth daily.          The results of significant diagnostics from this hospitalization (including imaging, microbiology, ancillary and  laboratory) are listed below for reference.    Significant Diagnostic Studies: Ct Chest W Contrast  11/18/2012   *RADIOLOGY REPORT*  Clinical Data: History of colon cancer.  Chest pain.  Cough. Staging scan.  CT CHEST WITH CONTRAST  Technique:  Multidetector CT imaging of the chest was performed following the standard protocol during bolus administration of intravenous contrast.  Contrast: 80mL OMNIPAQUE IOHEXOL 300 MG/ML  SOLN  Comparison: Chest CT 04/14/2010.  Findings:  Mediastinum: There are several prominent mediastinal lymph nodes, including borderline enlarged subcarinal lymph nodes measuring up to 9 mm in short axis, a low paraesophageal lymph node measuring 12 mm in short axis, and numerous borderline enlarged anterior mediastinal lymph nodes.  In addition, there are several juxtaphrenic lymph nodes which are borderline to mildly enlarged measuring up to 10 mm in short axis.  These generally appear slightly more conspicuous than on the prior CT scan from 11/12/2012, concerning for metastatic disease.  Heart size is normal. There is no significant pericardial fluid, thickening or pericardial calcification.  No hilar lymphadenopathy.  Esophagus is unremarkable in appearance.  Lungs/Pleura: Linear opacity in the right lower lobe may represent an area of subsegmental atelectasis and/or scarring.  No definite solid appearing pulmonary nodules or masses are identified.  There are a few patchy areas of ground-glass attenuation, largest of which is in the anterior aspect of the right upper lobe (image 23 of series 5) measuring approximately 13 mm in diameter.  No pleural effusions.  Upper Abdomen: Postoperative changes of cholecystectomy are noted. There appears to be a surgical drain in the right upper quadrant of the abdomen which is incompletely visualized.  Thickening and nodularity of the omentum and peritoneal lining is noted but incompletely visualized, concerning for peritoneal disease. Multiple small  lymph nodes are noted throughout the upper abdomen, largest of which are in the gastrohepatic ligament measuring up to 10 mm.  Small amount of ascites.  Musculoskeletal: There are no aggressive appearing lytic or blastic lesions noted in the visualized portions of the skeleton.  IMPRESSION: 1.  Multiple borderline enlarged and mildly enlarged mediastinal lymph nodes, as above, suspicious for lymphatic spread of disease. 2.  No definite suspicious appearing pulmonary nodules to suggest metastatic disease to the lungs at this time.  3.  However, there are a few scattered areas of nonspecific ground glass attenuation of the lungs which warrant attention on follow-up studies. Initial follow-up by chest CT without contrast is recommended in 3 months to confirm persistence.   This recommendation follows the consensus statement: Recommendations for the Management of Subsolid Pulmonary Nodules Detected at CT:  A Statement from the Fleischner Society as published in Radiology 2013; 266:304-317. 4.  Thickening of the omentum, multifocal peritoneal and omental nodularity, and upper abdominal lymphadenopathy, suspicious for metastatic disease.  Small  volume of ascites may be malignant.   Original Report Authenticated By: Trudie Reed, M.D.   Ct Abdomen Pelvis W Contrast  11/12/2012   *RADIOLOGY REPORT*  Clinical Data: Right flank pain fever, and guarding for 6 weeks.  CT ABDOMEN AND PELVIS WITH CONTRAST  Technique:  Multidetector CT imaging of the abdomen and pelvis was performed following the standard protocol during bolus administration of intravenous contrast.  Contrast: 30mL OMNIPAQUE IOHEXOL 300 MG/ML  SOLN, OMNIPAQUE IOHEXOL 300 MG/ML  SOLN  Comparison: CT abdomen pelvis 11/14/2011  Findings: The lung bases are clear.  There are reactive size right juxtadiaphragmatic lymph nodes. Additionally, there is a lymph node anterior to the pericardium in the midline that has increased since prior CT, and measures 7 mm  short axis.  There is a small amount of perihepatic ascites and there is some scattered fluid and/or stranding in the mesenteries of the right abdomen.  The appendix is dilated and has enhancing walls, measuring up to 10 mm diameter on image number 64.  Medial and adjacent to the base of the appendix and cecum is a fluid and gas collection consistent with an abscess.  This abscess approximately 4.1 x 4.4 cm axial dimension and 5.1 cm craniocaudal span.  There are reactive size lymph nodes in the right ileocolic mesentery. There are may be a tiny amount of free fluid in the right aspect of the dependent portion of the pelvis on image number 83.  No evidence of bowel obstruction.  No free intraperitoneal air is identified.  Mild diffuse fatty infiltration of the liver.  No focal hepatic lesion or biliary ductal dilatation.  Patient is status post cholecystectomy.  The spleen, adrenal glands, kidneys, and pancreas are within normal limits.  The abdominal aorta is normal caliber and contains scattered atherosclerotic calcification.  The urinary bladder prostate gland seminal vesicles are within normal limits.  No acute bony abnormality is identified.  IMPRESSION:  1.  Findings highly suggestive of acute appendicitis complicated by rupture and formation of a right lower quadrant abscess, with reactive adjacent lymphadenopathy. This study is made a call report.  2.  Small amount of perihepatic ascites may be due to appendiceal rupture, or may be reactive. 3.  Reactive size juxta-diaphragmatic lymph nodes.   Original Report Authenticated By: Britta Mccreedy, M.D.   Dg Chest Port 1 View  11/14/2012   *RADIOLOGY REPORT*  Clinical Data: Hypoxia with chronic cough and history of chronic bronchitis.  Hypertension.  Smoker with diabetes  PORTABLE CHEST - 1 VIEW  Comparison: 09/25/2011  Findings: Heart size is prominent but felt likely to be within normal limits given the projection.  A normal mediastinal contour is seen.  The  lung fields are notable for linear atelectasis at both lung bases.  No focal infiltrates or signs of congestive failure are seen.  No pleural fluid or significant peribronchial cuffing is noted.  Bony structures appear intact.  IMPRESSION: Bibasilar subsegmental linear atelectasis.  Otherwise stable   Original Report Authenticated By: Rhodia Albright, M.D.    Microbiology: No results found for this or any previous visit (from the past 240 hour(s)).   Labs: Basic Metabolic Panel:  Recent Labs Lab 11/12/12 1430 11/12/12 1850 11/14/12 0420 11/15/12 0409 11/17/12 0446  NA 132* 130* 137 134* 129*  K 3.9 3.9 3.7 4.1 3.9  CL 97 94* 100 98 97  CO2 26 23 29 28 26   GLUCOSE 181* 162* 133* 129* 152*  BUN 8 7 10 11  6  CREATININE 0.72 0.66 0.75 0.97 0.81  CALCIUM 9.5 9.6 8.9 8.9 8.7   Liver Function Tests:  Recent Labs Lab 11/12/12 1430 11/12/12 1850  AST 20 21  ALT 47 47  ALKPHOS 91 124*  BILITOT 0.7 0.7  PROT 6.5 7.5  ALBUMIN 3.4* 3.2*   CBC:  Recent Labs Lab 11/12/12 1430 11/12/12 1850 11/14/12 0420 11/15/12 0409 11/17/12 0446  WBC 11.6* 12.0* 15.3* 12.3* 9.3  NEUTROABS 8.9* 9.0*  --   --   --   HGB 14.8 14.7 13.8 13.1 12.9*  HCT 44.1 42.3 41.8 39.7 39.0  MCV 87.3 87.0 87.8 89.0 87.6  PLT 266 257 223 219 262   CBG:  Recent Labs Lab 11/16/12 2154 11/17/12 0710 11/17/12 1149 11/17/12 1736 11/17/12 2224  GLUCAP 172* 140* 183* 133* 181*     Time coordinating discharge:  Signed:  Alba Kriesel, ANP-BC

## 2012-11-18 NOTE — Telephone Encounter (Signed)
Spoke with patient and confirmed appointment with Dr. Truett Perna for 11/29/12.  Contact names and phone numbers were provided.

## 2012-11-18 NOTE — Discharge Summary (Signed)
Discussed with ER,NP prior to his discharge. He had left before being seen by me

## 2012-11-18 NOTE — Progress Notes (Signed)
Pt for d/c home today. IV d/c'd. JP drain d/c'd as ordered. Staples CDI to abdomen. Dressing over JP site CDI. D/C instructions/ follow-ups & RX's given with verbalized understanding. Family at bedside to assist with d/c. Pt refused Nicotine patch today.

## 2012-11-19 ENCOUNTER — Telehealth (INDEPENDENT_AMBULATORY_CARE_PROVIDER_SITE_OTHER): Payer: Self-pay

## 2012-11-19 ENCOUNTER — Telehealth: Payer: Self-pay | Admitting: Family Medicine

## 2012-11-19 NOTE — Telephone Encounter (Signed)
Called patient, told him I was very sorry to hear about his condition.  He asked if we could prescribe pain meds if needed and I stated I would be happy to help him in any way I could.  He will let us know.

## 2012-11-19 NOTE — Telephone Encounter (Signed)
Pt calling in b/c he has staples in that need to be removed this week per pt. The pt has a f/u appt with Dr Biagio Quint on 11/28/12 but he wants to see Dr Johna Sheriff instead even though Dr Biagio Quint is the one that did the sx. The pt states he is an old pt of Dr Johna Sheriff and he delt with Dr Johna Sheriff while he was in the hospital this last time. I advised pt that I would have to send a message to the nurse so she could check on this pt switching and get back in touch with the pt. The pt understands.

## 2012-11-20 ENCOUNTER — Encounter (INDEPENDENT_AMBULATORY_CARE_PROVIDER_SITE_OTHER): Payer: Self-pay

## 2012-11-20 ENCOUNTER — Telehealth (INDEPENDENT_AMBULATORY_CARE_PROVIDER_SITE_OTHER): Payer: Self-pay | Admitting: General Surgery

## 2012-11-20 NOTE — Telephone Encounter (Signed)
This patient called to request a doctor's note for possible securities issues for an upcoming flight. I explained that I would not recommend flying at this time especially with his new cancer diagnosis and recent surgery and explained that he will be high risk for DVT and pulmonary embolus. His brother recently committed suicide and he is going up to be with the family and will follow up with me in a week when he returns for staple removal. I did provide a note explaining that he has had recent surgery and has abdominal wall Staples in place and hopefully with this will not be a security issue at the airport.  He expressed understanding of his increased risk for blood clots.

## 2012-11-28 ENCOUNTER — Encounter (INDEPENDENT_AMBULATORY_CARE_PROVIDER_SITE_OTHER): Payer: Self-pay | Admitting: General Surgery

## 2012-11-28 ENCOUNTER — Ambulatory Visit (INDEPENDENT_AMBULATORY_CARE_PROVIDER_SITE_OTHER): Payer: Federal, State, Local not specified - PPO | Admitting: General Surgery

## 2012-11-28 VITALS — BP 110/72 | HR 78 | Temp 97.5°F | Resp 16 | Ht 69.0 in | Wt 202.8 lb

## 2012-11-28 DIAGNOSIS — Z5189 Encounter for other specified aftercare: Secondary | ICD-10-CM

## 2012-11-28 DIAGNOSIS — Z4889 Encounter for other specified surgical aftercare: Secondary | ICD-10-CM

## 2012-11-28 NOTE — Progress Notes (Signed)
Subjective:     Patient ID: Frank Baird, male   DOB: 09-Aug-1960, 52 y.o.   MRN: [id number]  HPI  the patient follows up status post exploratory laparotomy and newly diagnosed carcinomatosis for a suspected ruptured appendicitis which was ultimately found to be carcinomatosis. Her Coumadin his final pathology was concerning for hepatobiliary origin or possibly pancreatic. On CT scan there is no evidence of primary cancer. He is scheduled to see the oncologist tomorrow for further evaluation. He is eating fine and his bowels are functioning normally although he does have some constipation which she attributes to the pain medication. He has had good relief with milk of magnesia. He remains on pain medication but seems to be getting around okay.   Review of Systems     Objective:   Physical Exam No acute distress and nontoxic-appearing is sitting cupulae on the bed His abdomen is soft in does not have any significant tenderness 2 he is nondistended and his incision is healing fine. Her remove the staples today and the lower portion of his incision opened up slightly but he appears to have some of the granulation tissue under this. I do not see any sign of infection. 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 If this is hepatobiliary or pancreatic in nature then chemotherapy is really his only option regardless. He will get some followup laboratory studies checking for CA 19-9 and other tumor markers by his oncologist and we may consider upper and lower endoscopy as well as possible more extensive imaging such as PET scan after he is further out from his surgery. In the meantime, we will schedule him for Mediport placement in preparation for chemotherapy. I discussed with him the risks of surgery including infection, bleeding, pain, scarring, and  pneumothorax and need for chest tube and he expressed understanding to would like to proceed with Mediport placement    Plan:     He will follow up with medical oncology and I will schedule him for Mediport placement. Otherwise I See him back in about one month for repeat evaluation.

## 2012-11-29 ENCOUNTER — Telehealth: Payer: Self-pay | Admitting: Oncology

## 2012-11-29 ENCOUNTER — Encounter (HOSPITAL_COMMUNITY): Payer: Self-pay | Admitting: Pharmacy Technician

## 2012-11-29 ENCOUNTER — Encounter (HOSPITAL_COMMUNITY): Payer: Self-pay | Admitting: *Deleted

## 2012-11-29 ENCOUNTER — Telehealth: Payer: Self-pay | Admitting: *Deleted

## 2012-11-29 ENCOUNTER — Encounter: Payer: Self-pay | Admitting: Oncology

## 2012-11-29 ENCOUNTER — Ambulatory Visit (HOSPITAL_BASED_OUTPATIENT_CLINIC_OR_DEPARTMENT_OTHER): Payer: Federal, State, Local not specified - PPO | Admitting: Oncology

## 2012-11-29 ENCOUNTER — Other Ambulatory Visit: Payer: Self-pay | Admitting: *Deleted

## 2012-11-29 ENCOUNTER — Ambulatory Visit: Payer: Federal, State, Local not specified - PPO

## 2012-11-29 ENCOUNTER — Other Ambulatory Visit (HOSPITAL_COMMUNITY): Payer: Self-pay | Admitting: *Deleted

## 2012-11-29 VITALS — BP 116/66 | HR 64 | Temp 98.4°F | Resp 18 | Ht 69.0 in | Wt 203.9 lb

## 2012-11-29 DIAGNOSIS — C801 Malignant (primary) neoplasm, unspecified: Secondary | ICD-10-CM

## 2012-11-29 DIAGNOSIS — K869 Disease of pancreas, unspecified: Secondary | ICD-10-CM

## 2012-11-29 DIAGNOSIS — J449 Chronic obstructive pulmonary disease, unspecified: Secondary | ICD-10-CM

## 2012-11-29 DIAGNOSIS — G893 Neoplasm related pain (acute) (chronic): Secondary | ICD-10-CM

## 2012-11-29 MED ORDER — POLYETHYLENE GLYCOL 3350 17 GM/SCOOP PO POWD: 17.0000 g | Freq: Every day | ORAL | Status: DC

## 2012-11-29 MED ORDER — FENTANYL 50 MCG/HR TD PT72: 1.0000 | MEDICATED_PATCH | TRANSDERMAL | Status: DC

## 2012-11-29 NOTE — Telephone Encounter (Signed)
Called pt to clarify medication list. He reports he is taking Oxycodone HCL 10- 20 mg Q 4 hours PRN. Pt confirms he does not have oxycontin in the home. Med list updated.

## 2012-11-29 NOTE — Progress Notes (Signed)
11/29/12 1546  OBSTRUCTIVE SLEEP APNEA  Have you ever been diagnosed with sleep apnea through a sleep study? No  Do you snore loudly (loud enough to be heard through closed doors)?  1  Do you often feel tired, fatigued, or sleepy during the daytime? 1  Has anyone observed you stop breathing during your sleep? 1  Do you have, or are you being treated for high blood pressure? 1  Age over 52 years old? 1  Neck circumference greater than 40 cm/18 inches? 0  Gender: 1  Obstructive Sleep Apnea Score 6  Score 4 or greater  Results sent to PCP

## 2012-11-29 NOTE — Progress Notes (Signed)
Checked in new pt with no financial concerns. °

## 2012-11-29 NOTE — Progress Notes (Signed)
   [facility]    OFFICE PROGRESS NOTE   INTERVAL HISTORY:   I saw him in the hospital earlier this month when he was diagnosed with abdominal carcinomatosis. At the time of an exploratory laparotomy he was noted to have extensive carcinomatosis with evidence of a mass near the cecum. 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 The immunohistochemical profile found the tumor cells to be positive for cytokeratin 7 and negative for cytokeratin 20, TTF-1, and CDX-2. The immunophenotype was felt to be inconsistent with an appendiceal primary and more consistent with a upper gastrointestinal or pancreaticobiliary primary.  He continues to recover from surgery. He has chronic back pain. He also has abdominal pain. He is maintained on a 25 mcg Duragesic patch. He takes oxycodone several times per day for relief of pain. He complains of constipation. He denies dysphagia.  Objective:  Vital signs in last 24 hours:  Blood pressure 116/66, pulse 64, temperature 98.4 F (36.9 C), temperature source Oral, resp. rate 18, height 5\' 9"  (1.753 m), weight 203 lb 14.4 oz (92.488 kg), SpO2 98.00%.    HEENT: Neck without mass Lymphatics: No cervical, supra-clavicular, or axillary nodes Resp: Lungs clear bilaterally Cardio: Regular rate and rhythm GI: Mildly distended, healed surgical incision with Steri-Strips in place, tender in the right abdomen, no mass, no hepatomegaly Vascular: No leg edema   Lab Results:  Lab Results  Component Value Date   WBC 9.3 11/17/2012   HGB 12.9* 11/17/2012   HCT 39.0 11/17/2012   MCV 87.6 11/17/2012   PLT 262 11/17/2012      Medications: I have reviewed the patient's current medications.  Assessment/Plan:  1. 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 CT of the chest  11/18/2012 with borderline enlarged mediastinal nodes, gastrohepatic ligament nodes, and omental/peritoneal nodularity.  2. Cecal/appendix mass noted at the time of exploratory laparotomy 11/13/2012  3. COPD  4. Pain secondary to carcinomatosis  Disposition:  He has been diagnosed with metastatic adenocarcinoma. No definite primary tumor site has been identified. The immunohistochemical profile from the omental/peritoneal biopsies is most consistent with an upper gastrointestinal or pancreaticobiliary primary. I discussed the differential diagnosis with Mr. Broxson and his wife. We will refer him to Dr. Elnoria Howard for an upper endoscopy. He is scheduled for placement of a Port-A-Cath on 12/02/2012. Mr. Pink will return for an office visit and further discussion on 12/10/2012.  We increased the Duragesic patch to 50 mcg. He will begin MiraLAX for the constipation.  Thornton Papas, MD  11/29/2012  5:09 PM

## 2012-11-29 NOTE — Progress Notes (Signed)
Met with patient and wife.  Explained role of nurse navigator.  Contact names and phone number for [facility] staff and resources were provided.  Patient diagnosis of GI cancer, primary site, still TBD.  Will provide educational materials to patient once determined.  Referral made to dietician per request of patient.  Information provided to patient on GI support and caregiver groups.  Patient declined need for SW at this time.  Will continue to follow as needed.

## 2012-11-29 NOTE — Telephone Encounter (Signed)
gv and printed appt sched and avs for pt....gv AMY Dr. Elnoria Howard pager so Dr. Truett Perna could coordinate Monday appt.Marland KitchenMarland Kitchen

## 2012-12-01 NOTE — Anesthesia Preprocedure Evaluation (Addendum)
Anesthesia Evaluation  Patient identified by MRN, date of birth, ID band Patient awake    Reviewed: Allergy & Precautions, H&P , NPO status , Patient's Chart, lab work & pertinent test results  History of Anesthesia Complications (+) PROLONGED EMERGENCE  Airway Mallampati: I TM Distance: >3 FB Neck ROM: Full    Dental  (+) Teeth Intact, Poor Dentition, Dental Advisory Given, Edentulous Upper and Partial Lower   Pulmonary neg pulmonary ROS, COPDCurrent Smoker,  breath sounds clear to auscultation        Cardiovascular hypertension, Pt. on medications Rhythm:Regular Rate:Normal     Neuro/Psych Anxiety Depression negative neurological ROS  negative psych ROS   GI/Hepatic Neg liver ROS, GERD-  Medicated,  Endo/Other  diabetes, Poorly Controlled, Type 2, Oral Hypoglycemic Agents  Renal/GU negative Renal ROS     Musculoskeletal negative musculoskeletal ROS (+)   Abdominal (+) + obese,   Peds  Hematology negative hematology ROS (+)   Anesthesia Other Findings   Reproductive/Obstetrics                          Anesthesia Physical Anesthesia Plan  ASA: III  Anesthesia Plan: General   Post-op Pain Management:    Induction: Intravenous  Airway Management Planned: LMA  Additional Equipment:   Intra-op Plan:   Post-operative Plan: Extubation in OR  Informed Consent: I have reviewed the patients History and Physical, chart, labs and discussed the procedure including the risks, benefits and alternatives for the proposed anesthesia with the patient or authorized representative who has indicated his/her understanding and acceptance.   Dental advisory given  Plan Discussed with: CRNA  Anesthesia Plan Comments:         Anesthesia Quick Evaluation

## 2012-12-02 ENCOUNTER — Ambulatory Visit (HOSPITAL_COMMUNITY): Payer: Federal, State, Local not specified - PPO

## 2012-12-02 ENCOUNTER — Encounter (HOSPITAL_COMMUNITY)
Admission: RE | Disposition: A | Discharge status: Home / Self Care | Payer: Self-pay | Source: Ambulatory Visit | Attending: General Surgery

## 2012-12-02 ENCOUNTER — Encounter: Payer: Self-pay | Admitting: Oncology

## 2012-12-02 ENCOUNTER — Ambulatory Visit (HOSPITAL_COMMUNITY)
Admission: RE | Admit: 2012-12-02 | Discharge: 2012-12-02 | Disposition: A | Discharge status: Home / Self Care | Payer: Federal, State, Local not specified - PPO | Source: Ambulatory Visit | Attending: General Surgery | Admitting: General Surgery

## 2012-12-02 ENCOUNTER — Encounter (HOSPITAL_COMMUNITY): Payer: Self-pay | Admitting: Anesthesiology

## 2012-12-02 ENCOUNTER — Encounter (HOSPITAL_COMMUNITY): Payer: Self-pay | Admitting: *Deleted

## 2012-12-02 ENCOUNTER — Ambulatory Visit (HOSPITAL_COMMUNITY): Payer: Federal, State, Local not specified - PPO | Admitting: Anesthesiology

## 2012-12-02 DIAGNOSIS — E785 Hyperlipidemia, unspecified: Secondary | ICD-10-CM | POA: Insufficient documentation

## 2012-12-02 DIAGNOSIS — Z79899 Other long term (current) drug therapy: Secondary | ICD-10-CM | POA: Insufficient documentation

## 2012-12-02 DIAGNOSIS — J4489 Other specified chronic obstructive pulmonary disease: Secondary | ICD-10-CM | POA: Insufficient documentation

## 2012-12-02 DIAGNOSIS — I10 Essential (primary) hypertension: Secondary | ICD-10-CM | POA: Insufficient documentation

## 2012-12-02 DIAGNOSIS — Z91038 Other insect allergy status: Secondary | ICD-10-CM | POA: Insufficient documentation

## 2012-12-02 DIAGNOSIS — F3289 Other specified depressive episodes: Secondary | ICD-10-CM | POA: Insufficient documentation

## 2012-12-02 DIAGNOSIS — K219 Gastro-esophageal reflux disease without esophagitis: Secondary | ICD-10-CM | POA: Insufficient documentation

## 2012-12-02 DIAGNOSIS — M129 Arthropathy, unspecified: Secondary | ICD-10-CM | POA: Insufficient documentation

## 2012-12-02 DIAGNOSIS — F329 Major depressive disorder, single episode, unspecified: Secondary | ICD-10-CM | POA: Insufficient documentation

## 2012-12-02 DIAGNOSIS — E119 Type 2 diabetes mellitus without complications: Secondary | ICD-10-CM | POA: Insufficient documentation

## 2012-12-02 DIAGNOSIS — J449 Chronic obstructive pulmonary disease, unspecified: Secondary | ICD-10-CM | POA: Insufficient documentation

## 2012-12-02 DIAGNOSIS — Z6829 Body mass index (BMI) 29.0-29.9, adult: Secondary | ICD-10-CM | POA: Insufficient documentation

## 2012-12-02 DIAGNOSIS — M109 Gout, unspecified: Secondary | ICD-10-CM | POA: Insufficient documentation

## 2012-12-02 DIAGNOSIS — C800 Disseminated malignant neoplasm, unspecified: Secondary | ICD-10-CM

## 2012-12-02 DIAGNOSIS — Z4889 Encounter for other specified surgical aftercare: Secondary | ICD-10-CM

## 2012-12-02 DIAGNOSIS — E669 Obesity, unspecified: Secondary | ICD-10-CM | POA: Insufficient documentation

## 2012-12-02 DIAGNOSIS — Z885 Allergy status to narcotic agent status: Secondary | ICD-10-CM | POA: Insufficient documentation

## 2012-12-02 DIAGNOSIS — F17200 Nicotine dependence, unspecified, uncomplicated: Secondary | ICD-10-CM | POA: Insufficient documentation

## 2012-12-02 DIAGNOSIS — Z880 Allergy status to penicillin: Secondary | ICD-10-CM | POA: Insufficient documentation

## 2012-12-02 DIAGNOSIS — F411 Generalized anxiety disorder: Secondary | ICD-10-CM | POA: Insufficient documentation

## 2012-12-02 HISTORY — DX: Personal history of other diseases of the musculoskeletal system and connective tissue: Z87.39

## 2012-12-02 HISTORY — DX: Depression, unspecified: F32.A

## 2012-12-02 HISTORY — DX: Major depressive disorder, single episode, unspecified: F32.9

## 2012-12-02 HISTORY — DX: Chronic obstructive pulmonary disease, unspecified: J44.9

## 2012-12-02 HISTORY — DX: Anxiety disorder, unspecified: F41.9

## 2012-12-02 HISTORY — PX: PORTACATH PLACEMENT: SHX2246

## 2012-12-02 LAB — SURGICAL PCR SCREEN
MRSA, PCR: NEGATIVE
Staphylococcus aureus: NEGATIVE

## 2012-12-02 LAB — PROTIME-INR: INR: 1 (ref 0.00–1.49)

## 2012-12-02 LAB — GLUCOSE, CAPILLARY
Glucose-Capillary: 182 mg/dL — ABNORMAL HIGH (ref 70–99)
Glucose-Capillary: 178 mg/dL — ABNORMAL HIGH (ref 70–99)

## 2012-12-02 SURGERY — INSERTION, TUNNELED CENTRAL VENOUS DEVICE, WITH PORT
Anesthesia: General | Site: Chest | Wound class: Clean

## 2012-12-02 MED ORDER — METOCLOPRAMIDE HCL 5 MG/ML IJ SOLN
INTRAMUSCULAR | Status: DC | PRN
  Administered 2012-12-02: 10 mg via INTRAVENOUS

## 2012-12-02 MED ORDER — LIDOCAINE HCL 1 % IJ SOLN
INTRAMUSCULAR | Status: AC
  Filled 2012-12-02: qty 20

## 2012-12-02 MED ORDER — LIDOCAINE-EPINEPHRINE (PF) 1 %-1:200000 IJ SOLN
INTRAMUSCULAR | Status: DC | PRN
  Administered 2012-12-02: 10 mL

## 2012-12-02 MED ORDER — MIDAZOLAM HCL 5 MG/5ML IJ SOLN
INTRAMUSCULAR | Status: DC | PRN
  Administered 2012-12-02: 1 mg via INTRAVENOUS
  Administered 2012-12-02: 2 mg via INTRAVENOUS

## 2012-12-02 MED ORDER — BUPIVACAINE HCL 0.25 % IJ SOLN
INTRAMUSCULAR | Status: DC | PRN
  Administered 2012-12-02: 10 mL

## 2012-12-02 MED ORDER — FENTANYL CITRATE 0.05 MG/ML IJ SOLN
INTRAMUSCULAR | Status: DC | PRN
  Administered 2012-12-02: 25 ug via INTRAVENOUS
  Administered 2012-12-02 (×2): 50 ug via INTRAVENOUS
  Administered 2012-12-02 (×3): 25 ug via INTRAVENOUS

## 2012-12-02 MED ORDER — HEPARIN SOD (PORK) LOCK FLUSH 100 UNIT/ML IV SOLN
INTRAVENOUS | Status: AC
  Filled 2012-12-02: qty 10

## 2012-12-02 MED ORDER — LIDOCAINE-EPINEPHRINE 1 %-1:100000 IJ SOLN
INTRAMUSCULAR | Status: AC
  Filled 2012-12-02: qty 1

## 2012-12-02 MED ORDER — SODIUM CHLORIDE 0.9 % IR SOLN
Freq: Once | Status: DC
  Filled 2012-12-02: qty 1.2

## 2012-12-02 MED ORDER — SODIUM CHLORIDE 0.9 % IR SOLN
Status: DC | PRN
  Administered 2012-12-02: 09:00:00

## 2012-12-02 MED ORDER — BUPIVACAINE-EPINEPHRINE (PF) 0.5% -1:200000 IJ SOLN
INTRAMUSCULAR | Status: AC
  Filled 2012-12-02: qty 10

## 2012-12-02 MED ORDER — LACTATED RINGERS IV SOLN
INTRAVENOUS | Status: DC | PRN
  Administered 2012-12-02 (×2): via INTRAVENOUS

## 2012-12-02 MED ORDER — MUPIROCIN 2 % EX OINT
TOPICAL_OINTMENT | CUTANEOUS | Status: AC
  Filled 2012-12-02: qty 22

## 2012-12-02 MED ORDER — EPHEDRINE SULFATE 50 MG/ML IJ SOLN
INTRAMUSCULAR | Status: DC | PRN
  Administered 2012-12-02: 5 mg via INTRAVENOUS

## 2012-12-02 MED ORDER — OXYCODONE HCL 5 MG PO TABS
10.0000 mg | ORAL_TABLET | ORAL | Status: DC | PRN
  Administered 2012-12-02: 10 mg via ORAL
  Filled 2012-12-02: qty 2

## 2012-12-02 MED ORDER — PROMETHAZINE HCL 25 MG/ML IJ SOLN: 6.2500 mg | INTRAMUSCULAR | Status: DC | PRN

## 2012-12-02 MED ORDER — DEXAMETHASONE SODIUM PHOSPHATE 4 MG/ML IJ SOLN
INTRAMUSCULAR | Status: DC | PRN
  Administered 2012-12-02: 4 mg via INTRAVENOUS

## 2012-12-02 MED ORDER — HYDROMORPHONE HCL PF 1 MG/ML IJ SOLN: 0.2500 mg | INTRAMUSCULAR | Status: DC | PRN

## 2012-12-02 MED ORDER — CLINDAMYCIN PHOSPHATE 900 MG/50ML IV SOLN
900.0000 mg | Freq: Once | INTRAVENOUS | Status: AC
  Administered 2012-12-02: 900 mg via INTRAVENOUS

## 2012-12-02 MED ORDER — ONDANSETRON HCL 4 MG/2ML IJ SOLN
INTRAMUSCULAR | Status: DC | PRN
  Administered 2012-12-02: 4 mg via INTRAVENOUS

## 2012-12-02 MED ORDER — HEPARIN SOD (PORK) LOCK FLUSH 100 UNIT/ML IV SOLN
INTRAVENOUS | Status: DC | PRN
  Administered 2012-12-02: 500 [IU] via INTRAVENOUS

## 2012-12-02 MED ORDER — OXYCODONE HCL 10 MG PO TABS: 10.0000 mg | ORAL_TABLET | ORAL | Status: DC | PRN

## 2012-12-02 MED ORDER — PROPOFOL 10 MG/ML IV BOLUS
INTRAVENOUS | Status: DC | PRN
  Administered 2012-12-02: 200 mg via INTRAVENOUS

## 2012-12-02 MED ORDER — LACTATED RINGERS IV SOLN: INTRAVENOUS | Status: DC

## 2012-12-02 MED ORDER — MUPIROCIN 2 % EX OINT
TOPICAL_OINTMENT | Freq: Two times a day (BID) | CUTANEOUS | Status: DC
  Administered 2012-12-02: 1 via NASAL

## 2012-12-02 MED ORDER — CLINDAMYCIN PHOSPHATE 900 MG/50ML IV SOLN
INTRAVENOUS | Status: AC
  Filled 2012-12-02: qty 50

## 2012-12-02 MED ORDER — BUPIVACAINE-EPINEPHRINE PF 0.25-1:200000 % IJ SOLN
INTRAMUSCULAR | Status: AC
  Filled 2012-12-02: qty 30

## 2012-12-02 SURGICAL SUPPLY — 40 items
ADH SKN CLS APL DERMABOND .7 (GAUZE/BANDAGES/DRESSINGS) ×1
APL SKNCLS STERI-STRIP NONHPOA (GAUZE/BANDAGES/DRESSINGS)
BAG DECANTER FOR FLEXI CONT (MISCELLANEOUS) ×2 IMPLANT
BENZOIN TINCTURE PRP APPL 2/3 (GAUZE/BANDAGES/DRESSINGS) IMPLANT
BLADE SURG 15 STRL LF DISP TIS (BLADE) ×1 IMPLANT
BLADE SURG 15 STRL SS (BLADE) ×2
CLOTH BEACON ORANGE TIMEOUT ST (SAFETY) ×2 IMPLANT
DECANTER SPIKE VIAL GLASS SM (MISCELLANEOUS) ×2 IMPLANT
DERMABOND ADVANCED (GAUZE/BANDAGES/DRESSINGS) ×1
DERMABOND ADVANCED .7 DNX12 (GAUZE/BANDAGES/DRESSINGS) ×1 IMPLANT
DRAPE C-ARM 42X72 X-RAY (DRAPES) ×2 IMPLANT
DRAPE LAPAROSCOPIC ABDOMINAL (DRAPES) ×2 IMPLANT
DRAPE UTILITY XL STRL (DRAPES) ×2 IMPLANT
DRSG TEGADERM 6X8 (GAUZE/BANDAGES/DRESSINGS) IMPLANT
ELECT REM PT RETURN 9FT ADLT (ELECTROSURGICAL) ×2
ELECTRODE REM PT RTRN 9FT ADLT (ELECTROSURGICAL) ×1 IMPLANT
GAUZE SPONGE 4X4 16PLY XRAY LF (GAUZE/BANDAGES/DRESSINGS) ×2 IMPLANT
GLOVE BIO SURGEON STRL SZ7.5 (GLOVE) ×4 IMPLANT
GLOVE SURG SS PI 7.5 STRL IVOR (GLOVE) ×4 IMPLANT
GOWN STRL NON-REIN LRG LVL3 (GOWN DISPOSABLE) IMPLANT
GOWN STRL REIN XL XLG (GOWN DISPOSABLE) ×6 IMPLANT
KIT BASIN OR (CUSTOM PROCEDURE TRAY) ×2 IMPLANT
KIT POWER CATH 8FR (Catheter) ×1 IMPLANT
MARKER SKIN DUAL TIP RULER LAB (MISCELLANEOUS) ×1 IMPLANT
NDL HYPO 25X1 1.5 SAFETY (NEEDLE) IMPLANT
NEEDLE HYPO 22GX1.5 SAFETY (NEEDLE) ×2 IMPLANT
NEEDLE HYPO 25X1 1.5 SAFETY (NEEDLE) IMPLANT
NS IRRIG 1000ML POUR BTL (IV SOLUTION) ×2 IMPLANT
PACK BASIC VI WITH GOWN DISP (CUSTOM PROCEDURE TRAY) ×2 IMPLANT
PENCIL BUTTON HOLSTER BLD 10FT (ELECTRODE) ×2 IMPLANT
SPONGE GAUZE 4X4 12PLY (GAUZE/BANDAGES/DRESSINGS) IMPLANT
STRIP CLOSURE SKIN 1/2X4 (GAUZE/BANDAGES/DRESSINGS) IMPLANT
SUT MNCRL AB 4-0 PS2 18 (SUTURE) ×2 IMPLANT
SUT PROLENE 2 0 SH DA (SUTURE) ×4 IMPLANT
SUT VIC AB 3-0 SH 27 (SUTURE) ×2
SUT VIC AB 3-0 SH 27XBRD (SUTURE) ×1 IMPLANT
SYR BULB IRRIGATION 50ML (SYRINGE) IMPLANT
SYR CONTROL 10ML LL (SYRINGE) ×2 IMPLANT
SYRINGE 10CC LL (SYRINGE) ×2 IMPLANT
TOWEL OR 17X26 10 PK STRL BLUE (TOWEL DISPOSABLE) ×2 IMPLANT

## 2012-12-02 NOTE — Brief Op Note (Addendum)
12/02/2012  8:37 AM  PATIENT:  Frank Baird  52 y.o. male  PRE-OPERATIVE DIAGNOSIS:  metastatic Cancer  POST-OPERATIVE DIAGNOSIS:  metastatic Cancer  PROCEDURE:  Procedure(s) with comments: INSERTION PORT-A-CATH (N/A) - with xray and ultrasound   SURGEON:  Surgeon(s) and Role:    * Lodema Pilot, DO - Primary  PHYSICIAN ASSISTANT:   ASSISTANTS: none   ANESTHESIA:   general  EBL:  Total I/O In: 1000 [I.V.:1000] Out: -   BLOOD ADMINISTERED:none  DRAINS: none   LOCAL MEDICATIONS USED:  MARCAINE    and LIDOCAINE   SPECIMEN:  No Specimen  DISPOSITION OF SPECIMEN:  N/A  COUNTS:  YES  TOURNIQUET:  * No tourniquets in log *  DICTATION: .Other Dictation: Dictation Number dictated Job [id number]  PLAN OF CARE: Discharge to home after PACU  PATIENT DISPOSITION:  PACU - hemodynamically stable.   Delay start of Pharmacological VTE agent (>24hrs) due to surgical blood loss or risk of bleeding: no

## 2012-12-02 NOTE — Progress Notes (Signed)
Put wife's fmla form on nurse's desk. °

## 2012-12-02 NOTE — Transfer of Care (Signed)
Immediate Anesthesia Transfer of Care Note  Patient: Frank Baird  Procedure(s) Performed: Procedure(s) with comments: INSERTION PORT-A-CATH (N/A) - with xray and ultrasound   Patient Location: PACU  Anesthesia Type:General  Level of Consciousness: awake, patient cooperative and responds to stimulation, drowsy, ventilating well  Airway & Oxygen Therapy: Patient Spontanous Breathing and Patient connected to face mask oxygen  Post-op Assessment: Report given to PACU RN, Post -op Vital signs reviewed and stable and Patient moving all extremities X 4  Post vital signs: Reviewed and stable  Complications: No apparent anesthesia complications

## 2012-12-02 NOTE — Preoperative (Signed)
Beta Blockers   Reason not to administer Beta Blockers:Not Applicable, not on home BB 

## 2012-12-02 NOTE — Anesthesia Postprocedure Evaluation (Signed)
Anesthesia Post Note  Patient: Frank Baird  Procedure(s) Performed: Procedure(s) (LRB): INSERTION PORT-A-CATH (N/A)  Anesthesia type: General  Patient location: PACU  Post pain: Pain level controlled  Post assessment: Post-op Vital signs reviewed  Last Vitals:  Filed Vitals:   12/02/12 0930  BP:   Pulse: 71  Temp:   Resp: 18    Post vital signs: Reviewed  Level of consciousness: sedated  Complications: No apparent anesthesia complications

## 2012-12-02 NOTE — Op Note (Signed)
NAME:  KENTO, GOSSMAN NO.:  [id number]  MEDICAL RECORD NO.:  [id number]  LOCATION:  [facility]                         FACILITY:  [facility]  PHYSICIAN:  Lodema Pilot, MD       DATE OF BIRTH:  09/05/1960  DATE OF PROCEDURE:  12/02/2012 DATE OF DISCHARGE:  12/02/2012                              OPERATIVE REPORT   PROCEDURE:  Ultrasound-guided left subclavian Mediport placement.  PREOPERATIVE DIAGNOSIS:  Carcinomatosis.  POSTOPERATIVE DIAGNOSIS:  Carcinomatosis.  SURGEON:  Lodema Pilot, MD  ASSISTANT:  None.  ANESTHESIA:  General anesthesia with 20 mL of 1% lidocaine with epinephrine and 0.25% Marcaine in a 50:50 mixture.  FLUIDS:  1500 mL crystalloid.  ESTIMATED BLOOD LOSS:  Minimal.  DRAINS:  None.  SPECIMENS:  None.  COMPLICATIONS:  None apparent.  FINDINGS:  Successful placement of 8-French PowerPort in the left subclavian vein.  INDICATION FOR PROCEDURE:  Mr. Bristol is a 52 year old male, known to me for a recently diagnosed carcinomatosis.  He has see Dr. Elnita Maxwell, his oncologist who has recommended port placement, and I spoke with Dr. Elnita Maxwell this morning who agreed to admit the port placement.  OPERATIVE DETAILS:  Mr. Wacha was seen and evaluated in the preoperative area and risks and benefits of procedure were discussed in lay terms.  Informed consent was obtained.  He was given prophylactic antibiotics and taken to the operating room, placed on the table in the supine position.  General LMA anesthesia was obtained.  His arms were tucked bilaterally and his chest and neck were prepped and draped in the standard surgical fashion.  Using ultrasound guidance, I identified the subclavian vein, which was compressible and located in its usual anatomic position.  I marked this area and on the first attempt using a finder needle, aspirated dark venous appearing nonpulsatile blood.  The guidewire was passed into the vein under fluoroscopic guidance  and confirmed to be in the superior vena cava.  The needle was removed, and the wire was clamped to the drape.  I made a subcutaneous pocket just inferior to the access site on the pectoral fascia.  The 2-0 Prolene sutures were placed at 10 o'clock and 2 o'clock position on the port, and the port was not sutured down at this time.  I opened up the access incision near the guidewire and using the tunneler and introducer sheath, I accessed the vein under fluoroscopic guidance.  The dilator and the guidewire were removed.  The catheter, which was already tunneled to the access site was placed in the introducer sheath, and was confirmed to be in the superior vena cava.  The sheath was then removed leaving the catheter in place.  I pulled back the catheter to what appeared to be the superior vena cava atrial junction, and the catheter was cut and the port was joined with the catheter and locked into place with the locking device.  The port was then sutured to the pectoral fascia with the already placed sutures, and the port was aspirated with venous-appearing blood and flushed without difficulty.  The fluoroscopy was used to confirm that the tubing was kinked and the catheter tip was at the atriocaval junction  and a final flush was administered through the catheter and the cavity was injected with 20 mL of 1% lidocaine with epinephrine and 0.25% Marcaine in a 50:50 mixture, and the calf was noted be hemostatic.  The subcu tissue was approximated with 3-0 Vicryl suture, and the skin edges were approximated with 4-0 Monocryl subcuticular suture.  Skin was washed and dried, and Dermabond was applied.  All sponge and instrument counts were correct at the end of the case.  The patient tolerated the procedure well without apparent complication.  He was stable and ready for transfer to recovery room in stable condition.  They are to obtain a postoperative chest x-ray, with a plan to discharge to  home, pending the results of that chest x-ray.          ______________________________ Lodema Pilot, MD     BL/MEDQ  D:  12/02/2012  T:  12/02/2012  Job:  [id number]

## 2012-12-02 NOTE — H&P (Signed)
Frank Baird is an 52 y.o. male.  HPI: known to me for recent ex. 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  He has seen Dr. Truett Baird who agrees to proceed with mediport placement. He continues to have back pain.  Past Medical History  Diagnosis Date  . RUQ pain   . Complication of anesthesia     difficulty waking up  . Arthritis   . GERD (gastroesophageal reflux disease)   . Diabetes mellitus   . Hyperlipidemia   . Hypertension   . Cancer   . History of gout   . COPD (chronic obstructive pulmonary disease)   . Anxiety   . Depression     Past Surgical History  Procedure Laterality Date  . Shoulder surgery  2012    right shoulder rotator cuff  . Nasal sinus surgery  2000  . Hernia repair      umbilical and right inguinal  . Elbow surgery      right elbow ligament  . Eye surgery      bilateral cataract surgery  . Cholecystectomy  09/26/2011    Procedure: LAPAROSCOPIC CHOLECYSTECTOMY WITH INTRAOPERATIVE CHOLANGIOGRAM;  Surgeon: Mariella Saa, MD;  Location: [facility] OR;  Service: General;  Laterality: N/A;  . Laparoscopic appendectomy N/A 11/12/2012    Procedure: DIAGNOSTIC LAPAROSCOPY, OPEN EXPLORATORY LAPAROTOMY, INCISIONAL BIOPSIES OF CALICFORM LIGAMENT, MESSENTERY, UMBILICAL LIGAMENT, BAND OF DUODENUM;  Surgeon: Lodema Pilot, DO;  Location: [facility];  Service: General;  Laterality: N/A;    Family History  Problem Relation Age of Onset  . Heart disease Mother     Social History:  reports that he has been smoking.  He has never used smokeless tobacco. He reports that  drinks alcohol. He reports that he does not use illicit drugs.  Allergies:  Allergies  Allergen Reactions  . Bee Venom     Unsure of reaction, Stung as baby and almost died  . Codeine     Bad stomach cramps    . Penicillins Other (See Comments)    unknown    Medications: I have reviewed the patient's current  medications.  Results for orders placed during the hospital encounter of 12/02/12 (from the past 48 hour(s))  PROTIME-INR     Status: None   Collection Time    12/02/12  6:05 AM      Result Value Range   Prothrombin Time 13.1  11.6 - 15.2 seconds   INR 1.00  0.00 - 1.49  GLUCOSE, CAPILLARY     Status: Abnormal   Collection Time    12/02/12  6:17 AM      Result Value Range   Glucose-Capillary 182 (*) 70 - 99 mg/dL   Comment 1 Documented in Chart      Chest 2 View  12/02/2012   *RADIOLOGY REPORT*  Clinical Data: Preoperative chest radiograph; cough.  History of smoking.  CHEST - 2 VIEW  Comparison: Chest radiograph performed 11/14/2012, and CT of the chest performed 11/18/2012  Findings: The lungs are well-aerated.  Pulmonary vascularity is at the upper limits of normal.  There is no evidence of focal opacification, pleural effusion or pneumothorax.  The heart is normal in size; the mediastinal contour is within normal limits.  No acute osseous abnormalities are seen.  IMPRESSION: No acute cardiopulmonary process seen.   Original Report Authenticated By: Tonia Ghent, M.D.     Blood pressure 112/63, pulse 61, temperature  98.6 F (37 C), temperature source Oral, resp. rate 18, height 5\' 9"  (1.753 m), weight 200 lb (90.719 kg), SpO2 98.00%. General appearance: alert, cooperative and no distress Head: Normocephalic, without obvious abnormality, atraumatic Resp: nonlabored Cardio: normal rate, regular GI: incision healing okay Extremities: extremities normal, atraumatic, no cyanosis or edema  Assessment/Plan: Carcinomatosis. Plan for mediport placement.  I discussed with him the risks of infection, bleeding, pain, scarring, pneumo or hemothorax and need for chest tube, arrhythmia, and port malfunction, and possibly not needing the port.  He expressed understanding and desires mediport placement.   Lodema Pilot DAVID 12/02/2012, 7:20 AM

## 2012-12-03 ENCOUNTER — Encounter (HOSPITAL_COMMUNITY): Payer: Self-pay | Admitting: General Surgery

## 2012-12-03 ENCOUNTER — Encounter: Payer: Self-pay | Admitting: Oncology

## 2012-12-03 NOTE — Progress Notes (Signed)
Put wife's fmla form in registration desk °

## 2012-12-04 ENCOUNTER — Encounter: Payer: Self-pay | Admitting: Family Medicine

## 2012-12-04 ENCOUNTER — Telehealth: Payer: Self-pay | Admitting: Family Medicine

## 2012-12-04 NOTE — Telephone Encounter (Signed)
Pt aware.

## 2012-12-04 NOTE — Telephone Encounter (Signed)
Letter is on my desk

## 2012-12-06 ENCOUNTER — Encounter: Payer: Self-pay | Admitting: Family Medicine

## 2012-12-06 ENCOUNTER — Ambulatory Visit (INDEPENDENT_AMBULATORY_CARE_PROVIDER_SITE_OTHER): Payer: Federal, State, Local not specified - PPO | Admitting: Family Medicine

## 2012-12-06 VITALS — BP 118/60 | HR 84 | Temp 99.0°F | Resp 18 | Wt 201.0 lb

## 2012-12-06 DIAGNOSIS — C800 Disseminated malignant neoplasm, unspecified: Secondary | ICD-10-CM

## 2012-12-06 DIAGNOSIS — I10 Essential (primary) hypertension: Secondary | ICD-10-CM

## 2012-12-06 DIAGNOSIS — E119 Type 2 diabetes mellitus without complications: Secondary | ICD-10-CM

## 2012-12-06 MED ORDER — ALBUTEROL SULFATE HFA 108 (90 BASE) MCG/ACT IN AERS: 2.0000 | INHALATION_SPRAY | Freq: Four times a day (QID) | RESPIRATORY_TRACT | Status: AC | PRN

## 2012-12-06 NOTE — Progress Notes (Signed)
Subjective:    Patient ID: Frank Baird, male    DOB: 07/16/1960, 52 y.o.   MRN: [id number]  HPI Frank Baird presents today for followup.  In my last office visit he was complaining of right lower quadrant abdominal pain. He was sent for an urgent CAT scan which initially looked like a ruptured appendicitis. Unfortunately, he was discovered to have disseminated carcinomatosis with an unknown primary. He is currently seeing Dr. Truett Perna.  He has had a Port-A-Cath placed.  His upper endoscopy revealed no primary source. He is scheduled to see Dr. Salley Slaughter next week to discuss his chemotherapy options. He is here today for followup. He is currently using a fentanyl patch 50 mcg transdermal every 72 hours for his chronic back pain related to the cancer. He is also using oxycodone 10 mg every 6 hours. He is currently having to drive to [place] to get these prescriptions. He is still smoking. He is currently using Actos 30 mg by mouth daily to control his diabetes he has not been checking his sugars regularly.  His telemetry using lisinopril 20 mg by mouth daily for his blood pressure. His blood pressure is currently well controlled at 118/60. Past Medical History  Diagnosis Date  . RUQ pain   . Complication of anesthesia     difficulty waking up  . Arthritis   . GERD (gastroesophageal reflux disease)   . Diabetes mellitus   . Hyperlipidemia   . Hypertension   . Cancer   . History of gout   . COPD (chronic obstructive pulmonary disease)   . Anxiety   . Depression    Current Outpatient Prescriptions on File Prior to Visit  Medication Sig Dispense Refill  . fentaNYL (DURAGESIC - DOSED MCG/HR) 50 MCG/HR Place 1 patch (50 mcg total) onto the skin every 3 (three) days.  10 patch  0  . lisinopril (PRINIVIL,ZESTRIL) 20 MG tablet Take 20 mg by mouth every morning.       Marland Kitchen LORazepam (ATIVAN) 2 MG tablet Take 1 tablet (2 mg total) by mouth every 4 (four) hours as needed for anxiety.  50 tablet  0  .  naproxen sodium (ANAPROX) 220 MG tablet Take 220 mg by mouth 2 (two) times daily with a meal.      . NEXIUM 40 MG capsule Take 40 mg by mouth daily before breakfast.       . Oxycodone HCl 10 MG TABS Take 1 tablet (10 mg total) by mouth every 4 (four) hours as needed.  50 tablet  0  . pioglitazone (ACTOS) 30 MG tablet Take 30 mg by mouth every morning.        No current facility-administered medications on file prior to visit.   Allergies  Allergen Reactions  . Bee Venom     Unsure of reaction, Stung as baby and almost died  . Codeine     Bad stomach cramps    . Penicillins Other (See Comments)    unknown   History   Social History  . Marital Status: Married    Spouse Name: N/A    Number of Children: N/A  . Years of Education: N/A   Occupational History  . Not on file.   Social History Main Topics  . Smoking status: Current Every Day Smoker -- 1.00 packs/day  . Smokeless tobacco: Never Used  . Alcohol Use: Yes     Comment: rarely 0 to 1-2 per week  . Drug Use: No  . Sexually Active:  Not on file   Other Topics Concern  . Not on file   Social History Narrative  . No narrative on file      Review of Systems  All other systems reviewed and are negative.       Objective:   Physical Exam  Vitals reviewed. Constitutional: He appears well-developed and well-nourished.  Cardiovascular: Normal rate, regular rhythm and normal heart sounds.   No murmur heard. Pulmonary/Chest: Effort normal. He has wheezes. He has no rales. He exhibits no tenderness.  Abdominal: Soft. Bowel sounds are normal. He exhibits no distension. There is no tenderness. There is no rebound.          Assessment & Plan:  1. Type II or unspecified type diabetes mellitus without mention of complication, not stated as uncontrolled At patient I wanted his sugars run between 102 100. This prevented complications from diabetes, we'll also give him a bath her against hypoglycemia once he begins  chemotherapy. He is to check his fasting blood sugars and two-hour postprandial sugars over the next week and record these values to me. Depending on the sugar ranges we may need to add another medication or possibly insulin. 2. HTN (hypertension) Blood pressure is well-controlled today, continue current medications he loses substantial weight and his blood pressure drops further we may need to hold lisinopril  3. Adenocarcinoma carcinomatosis Patient is having substantial pain. For his convenience, I offered to refill his fentanyl patch as well as his oxycodone so that he would not have to drive to [facility] to get the prescriptions. I requested that he talk to Dr. Truett Perna and get Dr. Kalman Drape permission for me to do this. If Dr. Truett Perna has no concerns, I would be glad to prescribe medication for the patient's convenience.  I also prescribed the patient Proair 2 puffs inhaled every 6 hours when necessary wheezing. I also encouraged smoking cessation.

## 2012-12-09 ENCOUNTER — Ambulatory Visit: Payer: Federal, State, Local not specified - PPO | Admitting: Nutrition

## 2012-12-09 NOTE — Progress Notes (Signed)
Patient is a 52 year old male diagnosed with abdominal carcinomatosis status post exploratory lap.  Past medical history includes tobacco, COPD, GERD, diabetes, hyperlipidemia, hypertension, gout, anxiety, and depression.  Medications include Ativan, Nexium, and Actos.  Labs include glucose of 178.  Height: 69 inches. Weight: 201 pounds June 6. Usual body weight: 245 pounds documented March of 2013. Patient weighed 214 pounds on April 2014. BMI: 29.67.  Patient reports he is eating much less than before. He has pain when he eats. He experiences more pain when eating solid foods than when he drinks liquids. Constipation is typically a problem for him. He consumes 2 meals a day.  Nutrition diagnosis: Unintended weight loss related to new diagnosis of abdominal carcinomatosis and inadequate oral intake as evidenced by 6% weight loss in the past 6 weeks.  Intervention: Patient and family were educated to increase calories and protein in small frequent meals throughout the day. Patient was encouraged to try to eat bites and sips in small amounts 5-6 times daily. If patient is unable to consume solid food I've recommended patient to consume oral nutrition supplements such as boost glucose control or Glucerna. I've also provided him with samples of these products as well as Carnation breakfast essentials. I have reviewed the importance of patient consuming higher protein foods, especially with carbohydrate containing foods for improved glycemic control. I have reviewed examples of snacks patient could consume. I provided fact sheets and recipes. I've answered questions. Teach back method used.  Monitoring, evaluation, goals: Patient will tolerate increased oral intake to promote weight maintenance and adequate glycemic control.  Next visit: I will followup with patient weekly during chemotherapy or at another scheduled time.

## 2012-12-10 ENCOUNTER — Other Ambulatory Visit: Payer: Self-pay | Admitting: *Deleted

## 2012-12-10 ENCOUNTER — Ambulatory Visit (HOSPITAL_BASED_OUTPATIENT_CLINIC_OR_DEPARTMENT_OTHER): Payer: Federal, State, Local not specified - PPO | Admitting: Oncology

## 2012-12-10 VITALS — BP 130/67 | HR 55 | Temp 97.9°F | Resp 18 | Ht 69.0 in | Wt 202.9 lb

## 2012-12-10 DIAGNOSIS — C801 Malignant (primary) neoplasm, unspecified: Secondary | ICD-10-CM | POA: Insufficient documentation

## 2012-12-10 MED ORDER — LIDOCAINE-PRILOCAINE 2.5-2.5 % EX CREA: TOPICAL_CREAM | CUTANEOUS | Status: DC

## 2012-12-10 MED ORDER — PROCHLORPERAZINE MALEATE 10 MG PO TABS: 10.0000 mg | ORAL_TABLET | Freq: Four times a day (QID) | ORAL | Status: DC | PRN

## 2012-12-10 NOTE — Progress Notes (Signed)
   [facility]    OFFICE PROGRESS NOTE   INTERVAL HISTORY:   He returns as scheduled. Frank Baird continues to have abdominal and back pain. The pain is relieved with a Duragesic patch and oxycodone. Irregular bowel habits. He reports postprandial abdominal pain. He underwent placement of a Port-A-Cath on 12/02/2012. He reports tolerating the procedure well. Dr. Elnoria Howard performed an upper endoscopy and reported no upper GI tumor was found.  Objective:  Vital signs in last 24 hours:  Blood pressure 130/67, pulse 55, temperature 97.9 F (36.6 C), temperature source Oral, resp. rate 18, height 5\' 9"  (1.753 m), weight 202 lb 14.4 oz (92.035 kg).   Resp: Good air movement bilaterally, scattered and inspiratory coarse rhonchi, no respiratory distress Cardio: Regular rate and rhythm GI: No hepatomegaly, tender in the right mid abdomen,? Mass Vascular: No leg edema    Portacath/PICC-without erythema  Lab Results:  Lab Results  Component Value Date   WBC 9.3 11/17/2012   HGB 12.9* 11/17/2012   HCT 39.0 11/17/2012   MCV 87.6 11/17/2012   PLT 262 11/17/2012      Medications: I have reviewed the patient's current medications.  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 CT of the chest 11/18/2012 with borderline enlarged mediastinal nodes, gastrohepatic ligament nodes, and omental/peritoneal nodularity.  -Negative upper endoscopy 2. Cecal/appendix mass noted at the time of exploratory laparotomy 11/13/2012  3. COPD  4. Pain secondary to carcinomatosis  5. Status post Port-A-Cath placement 12/02/2012   Disposition:  He has been diagnosed with metastatic adenocarcinoma. He has carcinomatosis and there was clinical evidence of a dominant mass at the cecum/appendix at the time of the exploratory laparotomy  11/13/2012. The immunohistochemical profile it is most consistent with an upper gastrointestinal or pancreaticobiliary primary, but no primary tumor site was found on the upper endoscopy.  The clinical presentation is most consistent with an appendiceal or cecal primary, but he could have a primary tumor from an occult site. I recommend proceeding with FOLFOX chemotherapy. We will refer him to Dr. Lenis Noon to see if he is a candidate for HIPECC.  Frank Baird would like to begin chemotherapy as soon as possible. He will be scheduled for a chemotherapy teaching class and FOLFOX on 12/11/2012. I reviewed the potential toxicities associated with the FOLFOX regimen including the chance for nausea/vomiting, mucositis, diarrhea, alopecia, and hematologic toxicity. We discussed the skin rash, hyperpigmentation, and hand/foot syndrome associated with 5-fluorouracil. We reviewed the various types of neuropathy seen with oxaliplatin.  A baseline CEA will be checked when he returns on 12/11/2012.  He will be scheduled for an office visit and cycle 2 of FOLFOX on 12/25/2012.   Thornton Papas, MD  12/10/2012  5:40 PM

## 2012-12-11 ENCOUNTER — Telehealth: Payer: Self-pay | Admitting: Dietician

## 2012-12-11 ENCOUNTER — Telehealth: Payer: Self-pay | Admitting: Family Medicine

## 2012-12-11 ENCOUNTER — Encounter: Payer: Self-pay | Admitting: *Deleted

## 2012-12-11 ENCOUNTER — Ambulatory Visit (HOSPITAL_BASED_OUTPATIENT_CLINIC_OR_DEPARTMENT_OTHER): Payer: Federal, State, Local not specified - PPO

## 2012-12-11 ENCOUNTER — Other Ambulatory Visit: Payer: Federal, State, Local not specified - PPO

## 2012-12-11 VITALS — BP 113/66 | HR 79 | Temp 98.3°F | Resp 18

## 2012-12-11 DIAGNOSIS — C801 Malignant (primary) neoplasm, unspecified: Secondary | ICD-10-CM

## 2012-12-11 DIAGNOSIS — Z5111 Encounter for antineoplastic chemotherapy: Secondary | ICD-10-CM

## 2012-12-11 DIAGNOSIS — C779 Secondary and unspecified malignant neoplasm of lymph node, unspecified: Secondary | ICD-10-CM

## 2012-12-11 MED ORDER — FLUOROURACIL CHEMO INJECTION 2.5 GM/50ML
400.0000 mg/m2 | Freq: Once | INTRAVENOUS | Status: AC
  Administered 2012-12-11: 850 mg via INTRAVENOUS
  Filled 2012-12-11: qty 17

## 2012-12-11 MED ORDER — ESOMEPRAZOLE MAGNESIUM 40 MG PO CPDR: 40.0000 mg | DELAYED_RELEASE_CAPSULE | Freq: Every day | ORAL | Status: DC

## 2012-12-11 MED ORDER — SODIUM CHLORIDE 0.9 % IV SOLN
2400.0000 mg/m2 | INTRAVENOUS | Status: DC
  Administered 2012-12-11: 5100 mg via INTRAVENOUS
  Filled 2012-12-11: qty 102

## 2012-12-11 MED ORDER — DEXAMETHASONE SODIUM PHOSPHATE 10 MG/ML IJ SOLN
10.0000 mg | Freq: Once | INTRAMUSCULAR | Status: AC
  Administered 2012-12-11: 10 mg via INTRAVENOUS

## 2012-12-11 MED ORDER — ONDANSETRON 8 MG/50ML IVPB (CHCC)
8.0000 mg | Freq: Once | INTRAVENOUS | Status: AC
  Administered 2012-12-11: 8 mg via INTRAVENOUS

## 2012-12-11 MED ORDER — DEXTROSE 5 % IV SOLN
Freq: Once | INTRAVENOUS | Status: AC
  Administered 2012-12-11: 13:00:00 via INTRAVENOUS

## 2012-12-11 MED ORDER — OXALIPLATIN CHEMO INJECTION 100 MG/20ML
85.0000 mg/m2 | Freq: Once | INTRAVENOUS | Status: AC
  Administered 2012-12-11: 180 mg via INTRAVENOUS
  Filled 2012-12-11: qty 36

## 2012-12-11 MED ORDER — DEXTROSE 5 % IV SOLN
400.0000 mg/m2 | Freq: Once | INTRAVENOUS | Status: AC
  Administered 2012-12-11: 848 mg via INTRAVENOUS
  Filled 2012-12-11: qty 42.4

## 2012-12-11 NOTE — Telephone Encounter (Signed)
Rx Refilled  

## 2012-12-11 NOTE — Patient Instructions (Addendum)
[facility] Discharge Instructions for Patients Receiving Chemotherapy  Today you received the following chemotherapy agents 5 FU/Leucovorin/Oxaliplatin To help prevent nausea and vomiting after your treatment, we encourage you to take your nausea medication as prescribed.If you develop nausea and vomiting that is not controlled by your nausea medication, call the clinic.   BELOW ARE SYMPTOMS THAT SHOULD BE REPORTED IMMEDIATELY:  *FEVER GREATER THAN 100.5 F  *CHILLS WITH OR WITHOUT FEVER  NAUSEA AND VOMITING THAT IS NOT CONTROLLED WITH YOUR NAUSEA MEDICATION  *UNUSUAL SHORTNESS OF BREATH  *UNUSUAL BRUISING OR BLEEDING  TENDERNESS IN MOUTH AND THROAT WITH OR WITHOUT PRESENCE OF ULCERS  *URINARY PROBLEMS  *BOWEL PROBLEMS  UNUSUAL RASH Items with * indicate a potential emergency and should be followed up as soon as possible.  Feel free to call the clinic you have any questions or concerns. The clinic phone number is [phone].     Fluorouracil, 5-FU injection What is this medicine? FLUOROURACIL, 5-FU (flure oh YOOR a sil) is a chemotherapy drug. It slows the growth of cancer cells. This medicine is used to treat many types of cancer like breast cancer, colon or rectal cancer, pancreatic cancer, and stomach cancer. This medicine may be used for other purposes; ask your health care provider or pharmacist if you have questions. What should I tell my health care provider before I take this medicine? They need to know if you have any of these conditions: -blood disorders -dihydropyrimidine dehydrogenase (DPD) deficiency -infection (especially a virus infection such as chickenpox, cold sores, or herpes) -kidney disease -liver disease -malnourished, poor nutrition -recent or ongoing radiation therapy -an unusual or allergic reaction to fluorouracil, other chemotherapy, other medicines, foods, dyes, or preservatives -pregnant or trying to get  pregnant -breast-feeding How should I use this medicine? This drug is given as an infusion or injection into a vein. It is administered in a hospital or clinic by a specially trained health care professional. Talk to your pediatrician regarding the use of this medicine in children. Special care may be needed. Overdosage: If you think you have taken too much of this medicine contact a poison control center or emergency room at once. NOTE: This medicine is only for you. Do not share this medicine with others. What if I miss a dose? It is important not to miss your dose. Call your doctor or health care professional if you are unable to keep an appointment. What may interact with this medicine? -allopurinol -cimetidine -dapsone -digoxin -hydroxyurea -leucovorin -levamisole -medicines for seizures like ethotoin, fosphenytoin, phenytoin -medicines to increase blood counts like filgrastim, pegfilgrastim, sargramostim -medicines that treat or prevent blood clots like warfarin, enoxaparin, and dalteparin -methotrexate -metronidazole -pyrimethamine -some other chemotherapy drugs like busulfan, cisplatin, estramustine, vinblastine -trimethoprim -trimetrexate -vaccines Talk to your doctor or health care professional before taking any of these medicines: -acetaminophen -aspirin -ibuprofen -ketoprofen -naproxen This list may not describe all possible interactions. Give your health care provider a list of all the medicines, herbs, non-prescription drugs, or dietary supplements you use. Also tell them if you smoke, drink alcohol, or use illegal drugs. Some items may interact with your medicine. What should I watch for while using this medicine? Visit your doctor for checks on your progress. This drug may make you feel generally unwell. This is not uncommon, as chemotherapy can affect healthy cells as well as cancer cells. Report any side effects. Continue your course of treatment even though you  feel ill unless your doctor tells you to  stop. In some cases, you may be given additional medicines to help with side effects. Follow all directions for their use. Call your doctor or health care professional for advice if you get a fever, chills or sore throat, or other symptoms of a cold or flu. Do not treat yourself. This drug decreases your body's ability to fight infections. Try to avoid being around people who are sick. This medicine may increase your risk to bruise or bleed. Call your doctor or health care professional if you notice any unusual bleeding. Be careful brushing and flossing your teeth or using a toothpick because you may get an infection or bleed more easily. If you have any dental work done, tell your dentist you are receiving this medicine. Avoid taking products that contain aspirin, acetaminophen, ibuprofen, naproxen, or ketoprofen unless instructed by your doctor. These medicines may hide a fever. Do not become pregnant while taking this medicine. Women should inform their doctor if they wish to become pregnant or think they might be pregnant. There is a potential for serious side effects to an unborn child. Talk to your health care professional or pharmacist for more information. Do not breast-feed an infant while taking this medicine. Men should inform their doctor if they wish to father a child. This medicine may lower sperm counts. Do not treat diarrhea with over the counter products. Contact your doctor if you have diarrhea that lasts more than 2 days or if it is severe and watery. This medicine can make you more sensitive to the sun. Keep out of the sun. If you cannot avoid being in the sun, wear protective clothing and use sunscreen. Do not use sun lamps or tanning beds/booths. What side effects may I notice from receiving this medicine? Side effects that you should report to your doctor or health care professional as soon as possible: -allergic reactions like skin rash,  itching or hives, swelling of the face, lips, or tongue -low blood counts - this medicine may decrease the number of white blood cells, red blood cells and platelets. You may be at increased risk for infections and bleeding. -signs of infection - fever or chills, cough, sore throat, pain or difficulty passing urine -signs of decreased platelets or bleeding - bruising, pinpoint red spots on the skin, black, tarry stools, blood in the urine -signs of decreased red blood cells - unusually weak or tired, fainting spells, lightheadedness -breathing problems -changes in vision -chest pain -mouth sores -nausea and vomiting -pain, swelling, redness at site where injected -pain, tingling, numbness in the hands or feet -redness, swelling, or sores on hands or feet -stomach pain -unusual bleeding Side effects that usually do not require medical attention (report to your doctor or health care professional if they continue or are bothersome): -changes in finger or toe nails -diarrhea -dry or itchy skin -hair loss -headache -loss of appetite -sensitivity of eyes to the light -stomach upset -unusually teary eyes This list may not describe all possible side effects. Call your doctor for medical advice about side effects. You may report side effects to FDA at 1-800-FDA-1088. Where should I keep my medicine? This drug is given in a hospital or clinic and will not be stored at home. NOTE: This sheet is a summary. It may not cover all possible information. If you have questions about this medicine, talk to your doctor, pharmacist, or health care provider.  2013, [employer]. (10/23/2007 1:53:16 PM)   Leucovorin injection What is this medicine? LEUCOVORIN (loo koe VOR  in) is used to prevent or treat the harmful effects of some medicines. This medicine is used to treat anemia caused by a low amount of folic acid in the body. It is also used with 5-fluorouracil (5-FU) to treat colon  cancer. This medicine may be used for other purposes; ask your health care provider or pharmacist if you have questions. What should I tell my health care provider before I take this medicine? They need to know if you have any of these conditions: -anemia from low levels of vitamin B-12 in the blood -an unusual or allergic reaction to leucovorin, folic acid, other medicines, foods, dyes, or preservatives -pregnant or trying to get pregnant -breast-feeding How should I use this medicine? This medicine is for injection into a muscle or into a vein. It is given by a health care professional in a hospital or clinic setting. Talk to your pediatrician regarding the use of this medicine in children. Special care may be needed. Overdosage: If you think you have taken too much of this medicine contact a poison control center or emergency room at once. NOTE: This medicine is only for you. Do not share this medicine with others. What if I miss a dose? This does not apply. What may interact with this medicine? -capecitabine -fluorouracil -phenobarbital -phenytoin -primidone -trimethoprim-sulfamethoxazole This list may not describe all possible interactions. Give your health care provider a list of all the medicines, herbs, non-prescription drugs, or dietary supplements you use. Also tell them if you smoke, drink alcohol, or use illegal drugs. Some items may interact with your medicine. What should I watch for while using this medicine? Your condition will be monitored carefully while you are receiving this medicine. This medicine may increase the side effects of 5-fluorouracil, 5-FU. Tell your doctor or health care professional if you have diarrhea or mouth sores that do not get better or that get worse. What side effects may I notice from receiving this medicine? Side effects that you should report to your doctor or health care professional as soon as possible: -allergic reactions like skin rash,  itching or hives, swelling of the face, lips, or tongue -breathing problems -fever, infection -mouth sores -unusual bleeding or bruising -unusually weak or tired Side effects that usually do not require medical attention (report to your doctor or health care professional if they continue or are bothersome): -constipation or diarrhea -loss of appetite -nausea, vomiting This list may not describe all possible side effects. Call your doctor for medical advice about side effects. You may report side effects to FDA at 1-800-FDA-1088. Where should I keep my medicine? This drug is given in a hospital or clinic and will not be stored at home. NOTE: This sheet is a summary. It may not cover all possible information. If you have questions about this medicine, talk to your doctor, pharmacist, or health care provider.  2013, [employer] Standard. (12/24/2007 4:50:29 PM)   Oxaliplatin Injection What is this medicine? OXALIPLATIN (ox AL i PLA tin) is a chemotherapy drug. It targets fast dividing cells, like cancer cells, and causes these cells to die. This medicine is used to treat cancers of the colon and rectum, and many other cancers. This medicine may be used for other purposes; ask your health care provider or pharmacist if you have questions. What should I tell my health care provider before I take this medicine? They need to know if you have any of these conditions: -kidney disease -an unusual or allergic reaction to oxaliplatin, other chemotherapy,  other medicines, foods, dyes, or preservatives -pregnant or trying to get pregnant -breast-feeding How should I use this medicine? This drug is given as an infusion into a vein. It is administered in a hospital or clinic by a specially trained health care professional. Talk to your pediatrician regarding the use of this medicine in children. Special care may be needed. Overdosage: If you think you have taken too much of this medicine contact a  poison control center or emergency room at once. NOTE: This medicine is only for you. Do not share this medicine with others. What if I miss a dose? It is important not to miss a dose. Call your doctor or health care professional if you are unable to keep an appointment. What may interact with this medicine? -medicines to increase blood counts like filgrastim, pegfilgrastim, sargramostim -probenecid -some antibiotics like amikacin, gentamicin, neomycin, polymyxin B, streptomycin, tobramycin -zalcitabine Talk to your doctor or health care professional before taking any of these medicines: -acetaminophen -aspirin -ibuprofen -ketoprofen -naproxen This list may not describe all possible interactions. Give your health care provider a list of all the medicines, herbs, non-prescription drugs, or dietary supplements you use. Also tell them if you smoke, drink alcohol, or use illegal drugs. Some items may interact with your medicine. What should I watch for while using this medicine? Your condition will be monitored carefully while you are receiving this medicine. You will need important blood work done while you are taking this medicine. This medicine can make you more sensitive to cold. Do not drink cold drinks or use ice. Cover exposed skin before coming in contact with cold temperatures or cold objects. When out in cold weather wear warm clothing and cover your mouth and nose to warm the air that goes into your lungs. Tell your doctor if you get sensitive to the cold. This drug may make you feel generally unwell. This is not uncommon, as chemotherapy can affect healthy cells as well as cancer cells. Report any side effects. Continue your course of treatment even though you feel ill unless your doctor tells you to stop. In some cases, you may be given additional medicines to help with side effects. Follow all directions for their use. Call your doctor or health care professional for advice if you get  a fever, chills or sore throat, or other symptoms of a cold or flu. Do not treat yourself. This drug decreases your body's ability to fight infections. Try to avoid being around people who are sick. This medicine may increase your risk to bruise or bleed. Call your doctor or health care professional if you notice any unusual bleeding. Be careful brushing and flossing your teeth or using a toothpick because you may get an infection or bleed more easily. If you have any dental work done, tell your dentist you are receiving this medicine. Avoid taking products that contain aspirin, acetaminophen, ibuprofen, naproxen, or ketoprofen unless instructed by your doctor. These medicines may hide a fever. Do not become pregnant while taking this medicine. Women should inform their doctor if they wish to become pregnant or think they might be pregnant. There is a potential for serious side effects to an unborn child. Talk to your health care professional or pharmacist for more information. Do not breast-feed an infant while taking this medicine. Call your doctor or health care professional if you get diarrhea. Do not treat yourself. What side effects may I notice from receiving this medicine? Side effects that you should report to your  doctor or health care professional as soon as possible: -allergic reactions like skin rash, itching or hives, swelling of the face, lips, or tongue -low blood counts - This drug may decrease the number of white blood cells, red blood cells and platelets. You may be at increased risk for infections and bleeding. -signs of infection - fever or chills, cough, sore throat, pain or difficulty passing urine -signs of decreased platelets or bleeding - bruising, pinpoint red spots on the skin, black, tarry stools, nosebleeds -signs of decreased red blood cells - unusually weak or tired, fainting spells, lightheadedness -breathing problems -chest pain, pressure -cough -diarrhea -jaw  tightness -mouth sores -nausea and vomiting -pain, swelling, redness or irritation at the injection site -pain, tingling, numbness in the hands or feet -problems with balance, talking, walking -redness, blistering, peeling or loosening of the skin, including inside the mouth -trouble passing urine or change in the amount of urine Side effects that usually do not require medical attention (report to your doctor or health care professional if they continue or are bothersome): -changes in vision -constipation -hair loss -loss of appetite -metallic taste in the mouth or changes in taste -stomach pain This list may not describe all possible side effects. Call your doctor for medical advice about side effects. You may report side effects to FDA at 1-800-FDA-1088. Where should I keep my medicine? This drug is given in a hospital or clinic and will not be stored at home. NOTE: This sheet is a summary. It may not cover all possible information. If you have questions about this medicine, talk to your doctor, pharmacist, or health care provider.  2013, [employer] Standard. (01/14/2008 5:22:47 PM)

## 2012-12-12 ENCOUNTER — Telehealth: Payer: Self-pay | Admitting: Oncology

## 2012-12-12 ENCOUNTER — Telehealth: Payer: Self-pay | Admitting: *Deleted

## 2012-12-12 ENCOUNTER — Encounter: Payer: Self-pay | Admitting: Physician Assistant

## 2012-12-12 NOTE — Telephone Encounter (Signed)
s.w pt and and advised on 6.25.14 appt....pt ok and aware

## 2012-12-12 NOTE — Telephone Encounter (Signed)
Per staff message and POF I have scheduled appts.  JMW  

## 2012-12-13 ENCOUNTER — Ambulatory Visit (HOSPITAL_BASED_OUTPATIENT_CLINIC_OR_DEPARTMENT_OTHER): Payer: Federal, State, Local not specified - PPO

## 2012-12-13 ENCOUNTER — Telehealth: Payer: Self-pay | Admitting: *Deleted

## 2012-12-13 VITALS — BP 115/66 | HR 56 | Temp 98.1°F | Resp 20

## 2012-12-13 DIAGNOSIS — C801 Malignant (primary) neoplasm, unspecified: Secondary | ICD-10-CM

## 2012-12-13 LAB — COMPREHENSIVE METABOLIC PANEL (CC13)
Albumin: 3.6 g/dL (ref 3.5–5.0)
Alkaline Phosphatase: 79 U/L (ref 40–150)
BUN: 15.8 mg/dL (ref 7.0–26.0)
CO2: 26 mEq/L (ref 22–29)
Glucose: 215 mg/dl — ABNORMAL HIGH (ref 70–99)
Potassium: 4.1 mEq/L (ref 3.5–5.1)
Sodium: 134 mEq/L — ABNORMAL LOW (ref 136–145)
Total Bilirubin: 0.89 mg/dL (ref 0.20–1.20)
Total Protein: 7.5 g/dL (ref 6.4–8.3)

## 2012-12-13 MED ORDER — SODIUM CHLORIDE 0.9 % IJ SOLN
10.0000 mL | INTRAMUSCULAR | Status: DC | PRN
  Administered 2012-12-13: 10 mL
  Filled 2012-12-13: qty 10

## 2012-12-13 MED ORDER — HEPARIN SOD (PORK) LOCK FLUSH 100 UNIT/ML IV SOLN
500.0000 [IU] | Freq: Once | INTRAVENOUS | Status: AC | PRN
  Administered 2012-12-13: 500 [IU]
  Filled 2012-12-13: qty 5

## 2012-12-13 NOTE — Telephone Encounter (Signed)
Spoke with patient regarding side effects of chemo, He has had some nausea, which is relieved when he takes his meds. Instructed patient to continue eating and drinking well and call if meds do not relieve this. Patient verbalized understanding.

## 2012-12-13 NOTE — Telephone Encounter (Signed)
Message copied by Kathlynn Grate on Fri Dec 13, 2012  4:14 PM ------      Message from: Corky Sing      Created: Wed Dec 11, 2012  1:39 PM      Regarding: Chemo Follow up call      Contact: [phone]       First time FOLFOX. Dr.Sherrill patient. Please call.      Thanks,      Santina Evans ------

## 2012-12-20 ENCOUNTER — Encounter (INDEPENDENT_AMBULATORY_CARE_PROVIDER_SITE_OTHER): Payer: Federal, State, Local not specified - PPO | Admitting: General Surgery

## 2012-12-22 ENCOUNTER — Other Ambulatory Visit: Payer: Self-pay | Admitting: Oncology

## 2012-12-25 ENCOUNTER — Ambulatory Visit (HOSPITAL_BASED_OUTPATIENT_CLINIC_OR_DEPARTMENT_OTHER): Payer: Federal, State, Local not specified - PPO | Admitting: Nurse Practitioner

## 2012-12-25 ENCOUNTER — Telehealth: Payer: Self-pay | Admitting: Oncology

## 2012-12-25 ENCOUNTER — Other Ambulatory Visit (HOSPITAL_BASED_OUTPATIENT_CLINIC_OR_DEPARTMENT_OTHER): Payer: Federal, State, Local not specified - PPO

## 2012-12-25 ENCOUNTER — Ambulatory Visit (HOSPITAL_BASED_OUTPATIENT_CLINIC_OR_DEPARTMENT_OTHER): Payer: Federal, State, Local not specified - PPO

## 2012-12-25 VITALS — BP 129/70 | HR 55 | Temp 98.0°F | Resp 18 | Ht 69.0 in | Wt 195.7 lb

## 2012-12-25 DIAGNOSIS — C762 Malignant neoplasm of abdomen: Secondary | ICD-10-CM

## 2012-12-25 DIAGNOSIS — C786 Secondary malignant neoplasm of retroperitoneum and peritoneum: Secondary | ICD-10-CM

## 2012-12-25 DIAGNOSIS — C269 Malignant neoplasm of ill-defined sites within the digestive system: Secondary | ICD-10-CM

## 2012-12-25 DIAGNOSIS — C801 Malignant (primary) neoplasm, unspecified: Secondary | ICD-10-CM

## 2012-12-25 DIAGNOSIS — M549 Dorsalgia, unspecified: Secondary | ICD-10-CM

## 2012-12-25 DIAGNOSIS — K639 Disease of intestine, unspecified: Secondary | ICD-10-CM

## 2012-12-25 DIAGNOSIS — Z5111 Encounter for antineoplastic chemotherapy: Secondary | ICD-10-CM

## 2012-12-25 LAB — COMPREHENSIVE METABOLIC PANEL (CC13)
ALT: 49 U/L (ref 0–55)
AST: 37 U/L — ABNORMAL HIGH (ref 5–34)
Albumin: 3.5 g/dL (ref 3.5–5.0)
Alkaline Phosphatase: 66 U/L (ref 40–150)
Potassium: 3.3 mEq/L — ABNORMAL LOW (ref 3.5–5.1)
Sodium: 134 mEq/L — ABNORMAL LOW (ref 136–145)
Total Protein: 7.3 g/dL (ref 6.4–8.3)

## 2012-12-25 LAB — CBC WITH DIFFERENTIAL/PLATELET
Basophils Absolute: 0 10*3/uL (ref 0.0–0.1)
Eosinophils Absolute: 0.2 10*3/uL (ref 0.0–0.5)
HGB: 14.1 g/dL (ref 13.0–17.1)
MONO#: 0.6 10*3/uL (ref 0.1–0.9)
NEUT#: 5 10*3/uL (ref 1.5–6.5)
RBC: 4.81 10*6/uL (ref 4.20–5.82)
RDW: 14 % (ref 11.0–14.6)
WBC: 7.8 10*3/uL (ref 4.0–10.3)

## 2012-12-25 MED ORDER — LEUCOVORIN CALCIUM INJECTION 350 MG
401.0000 mg/m2 | Freq: Once | INTRAVENOUS | Status: AC
  Administered 2012-12-25: 850 mg via INTRAVENOUS
  Filled 2012-12-25: qty 42.5

## 2012-12-25 MED ORDER — PALONOSETRON HCL INJECTION 0.25 MG/5ML
0.2500 mg | Freq: Once | INTRAVENOUS | Status: AC
  Administered 2012-12-25: 0.25 mg via INTRAVENOUS

## 2012-12-25 MED ORDER — FENTANYL 50 MCG/HR TD PT72: 1.0000 | MEDICATED_PATCH | TRANSDERMAL | Status: DC

## 2012-12-25 MED ORDER — DEXTROSE 5 % IV SOLN
Freq: Once | INTRAVENOUS | Status: AC
  Administered 2012-12-25: 12:00:00 via INTRAVENOUS

## 2012-12-25 MED ORDER — SODIUM CHLORIDE 0.9 % IV SOLN
2400.0000 mg/m2 | INTRAVENOUS | Status: DC
  Administered 2012-12-25: 5100 mg via INTRAVENOUS
  Filled 2012-12-25: qty 102

## 2012-12-25 MED ORDER — FLUOROURACIL CHEMO INJECTION 2.5 GM/50ML
400.0000 mg/m2 | Freq: Once | INTRAVENOUS | Status: AC
  Administered 2012-12-25: 850 mg via INTRAVENOUS
  Filled 2012-12-25: qty 17

## 2012-12-25 MED ORDER — ONDANSETRON HCL 8 MG PO TABS: 8.0000 mg | ORAL_TABLET | Freq: Three times a day (TID) | ORAL | Status: AC | PRN

## 2012-12-25 MED ORDER — OXALIPLATIN CHEMO INJECTION 100 MG/20ML
85.0000 mg/m2 | Freq: Once | INTRAVENOUS | Status: AC
  Administered 2012-12-25: 180 mg via INTRAVENOUS
  Filled 2012-12-25: qty 36

## 2012-12-25 MED ORDER — OXYCODONE HCL 10 MG PO TABS: 10.0000 mg | ORAL_TABLET | ORAL | Status: DC | PRN

## 2012-12-25 MED ORDER — DEXAMETHASONE SODIUM PHOSPHATE 10 MG/ML IJ SOLN
10.0000 mg | Freq: Once | INTRAMUSCULAR | Status: AC
  Administered 2012-12-25: 10 mg via INTRAVENOUS

## 2012-12-25 NOTE — Progress Notes (Signed)
OFFICE PROGRESS NOTE  Interval history:  Frank Baird returns as scheduled. He completed cycle 1 FOLFOX on 12/11/2012. He had nausea beginning day 2, lasting for 4-5 days. No vomiting. He denies diarrhea. No mouth sores. No hand or foot pain or redness. Cold sensitivity lasted 4 days. He denies persistent neuropathy symptoms. He continues to have back pain and abdominal pain. He feels the back pain has increased since he was diagnosed. He is on a Duragesic 50 mcg patch every 3 days with oxycodone as needed. He denies focal extremity weakness. No bowel or bladder dysfunction.   Objective: Blood pressure 129/70, pulse 55, temperature 98 F (36.7 C), temperature source Oral, resp. rate 18, height 5\' 9"  (1.753 m), weight 195 lb 11.2 oz (88.769 kg).  No thrush or ulcerations. Lungs clear. Regular cardiac rhythm. Port-A-Cath site is without erythema. Abdomen is soft. Tender at the right upper quadrant. No hepatomegaly. Extremities are without edema. Vibratory sense is very minimally decreased over the fingertips per tuning fork exam. Motor strength 5 over 5. Knee DTRs 2+, symmetric.  Lab Results: Lab Results  Component Value Date   WBC 7.8 12/25/2012   HGB 14.1 12/25/2012   HCT 40.6 12/25/2012   MCV 84.4 12/25/2012   PLT 198 12/25/2012    Chemistry:    Chemistry      Component Value Date/Time   NA 134* 12/13/2012 1437   NA 129* 11/17/2012 0446   K 4.1 12/13/2012 1437   K 3.9 11/17/2012 0446   CL 97* 12/13/2012 1437   CL 97 11/17/2012 0446   CO2 26 12/13/2012 1437   CO2 26 11/17/2012 0446   BUN 15.8 12/13/2012 1437   BUN 6 11/17/2012 0446   CREATININE 1.0 12/13/2012 1437   CREATININE 0.81 11/17/2012 0446   CREATININE 0.72 11/12/2012 1430      Component Value Date/Time   CALCIUM 9.6 12/13/2012 1437   CALCIUM 8.7 11/17/2012 0446   ALKPHOS 79 12/13/2012 1437   ALKPHOS 124* 11/12/2012 1850   AST 32 12/13/2012 1437   AST 21 11/12/2012 1850   ALT 45 12/13/2012 1437   ALT 47 11/12/2012 1850   BILITOT 0.89  12/13/2012 1437   BILITOT 0.7 11/12/2012 1850       Studies/Results: Chest 2 View  12/02/2012   *RADIOLOGY REPORT*  Clinical Data: Preoperative chest radiograph; cough.  History of smoking.  CHEST - 2 VIEW  Comparison: Chest radiograph performed 11/14/2012, and CT of the chest performed 11/18/2012  Findings: The lungs are well-aerated.  Pulmonary vascularity is at the upper limits of normal.  There is no evidence of focal opacification, pleural effusion or pneumothorax.  The heart is normal in size; the mediastinal contour is within normal limits.  No acute osseous abnormalities are seen.  IMPRESSION: No acute cardiopulmonary process seen.   Original Report Authenticated By: Tonia Ghent, M.D.   Dg Chest Port 1 View  12/02/2012   *RADIOLOGY REPORT*  Clinical Data: Status post left chest port  PORTABLE CHEST - 1 VIEW  Comparison: 12/02/2012 0626 hours  Findings: The cardiac shadow is stable.  A power port is now seen on the left with catheter tip in the mid superior vena cava.  No pneumothorax is seen.  Cardiac shadow is stable.  The lungs are clear bilaterally.  IMPRESSION: No evidence of post placement pneumothorax.   Original Report Authenticated By: Alcide Clever, M.D.   Dg C-arm 1-60 Min-no Report  12/02/2012   CLINICAL DATA: metastatic Cancer   C-ARM 1-60 MINUTES  Fluoroscopy was utilized by the requesting physician.  No radiographic  interpretation.     Medications: I have reviewed the patient's current medications.  Assessment/Plan:  1. 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 CT of the chest 11/18/2012 with borderline enlarged mediastinal nodes, gastrohepatic ligament nodes and omental/peritoneal nodularity. Negative upper endoscopy. Normal CEA on 12/13/2012. Status post cycle 1 FOLFOX 12/11/2012. 2. Cecal/appendix mass noted at the time of  exploratory laparotomy 11/13/2012. 3. COPD. 4. Pain secondary to carcinomatosis. 5. Chronic back pain with recent increase. Question secondary to retroperitoneal adenopathy. 6. Status post Port-A-Cath placement 12/02/2012. 7. Delayed nausea following cycle 1 FOLFOX. Aloxi will be added beginning with cycle 2.  Disposition-Frank Baird has completed 1 cycle of FOLFOX chemotherapy. He had delayed nausea. We will adjust the premedication regimen to include Aloxi beginning with cycle 2. Plan to proceed with cycle 2 today as scheduled. He will return for a followup visit and cycle 3 in 2 weeks. He was given new prescriptions for the Duragesic patch and oxycodone.  He will contact the office prior to his next visit with any problems.  Plan reviewed with Dr. Truett Perna.  Lonna Cobb ANP/GNP-BC

## 2012-12-25 NOTE — Telephone Encounter (Signed)
Gave pt appt for June , July 2014 lab, MD and chemo, gave referral to Dr. Doristine Church to HIM for surgical referrall

## 2012-12-25 NOTE — Patient Instructions (Addendum)
[facility] Discharge Instructions for Patients Receiving Chemotherapy  Today you received the following chemotherapy agents :  Oxaliplatin, Leucovorin, 5FU.  To help prevent nausea and vomiting after your treatment, we encourage you to take your nausea medication as instructed by your physician.  DO NOT Take Zofran for next 3 days.  Can take Compazine 10 mg every 6 hrs as needed for nausea.   If you develop nausea and vomiting that is not controlled by your nausea medication, call the clinic.   BELOW ARE SYMPTOMS THAT SHOULD BE REPORTED IMMEDIATELY:  *FEVER GREATER THAN 100.5 F  *CHILLS WITH OR WITHOUT FEVER  NAUSEA AND VOMITING THAT IS NOT CONTROLLED WITH YOUR NAUSEA MEDICATION  *UNUSUAL SHORTNESS OF BREATH  *UNUSUAL BRUISING OR BLEEDING  TENDERNESS IN MOUTH AND THROAT WITH OR WITHOUT PRESENCE OF ULCERS  *URINARY PROBLEMS  *BOWEL PROBLEMS  UNUSUAL RASH Items with * indicate a potential emergency and should be followed up as soon as possible.  Feel free to call the clinic you have any questions or concerns. The clinic phone number is [phone].

## 2012-12-27 ENCOUNTER — Ambulatory Visit (HOSPITAL_BASED_OUTPATIENT_CLINIC_OR_DEPARTMENT_OTHER): Payer: Federal, State, Local not specified - PPO

## 2012-12-27 VITALS — BP 126/79 | HR 53 | Temp 98.7°F | Resp 20

## 2012-12-27 DIAGNOSIS — C801 Malignant (primary) neoplasm, unspecified: Secondary | ICD-10-CM

## 2012-12-27 MED ORDER — SODIUM CHLORIDE 0.9 % IJ SOLN
10.0000 mL | INTRAMUSCULAR | Status: DC | PRN
  Administered 2012-12-27: 10 mL
  Filled 2012-12-27: qty 10

## 2012-12-27 MED ORDER — HEPARIN SOD (PORK) LOCK FLUSH 100 UNIT/ML IV SOLN
500.0000 [IU] | Freq: Once | INTRAVENOUS | Status: AC | PRN
  Administered 2012-12-27: 500 [IU]
  Filled 2012-12-27: qty 5

## 2012-12-27 MED ORDER — POTASSIUM CHLORIDE CRYS ER 20 MEQ PO TBCR: 20.0000 meq | EXTENDED_RELEASE_TABLET | Freq: Every day | ORAL | Status: DC

## 2012-12-27 NOTE — Progress Notes (Signed)
Patient in today for pump d/c, c/o chest tightness and dyspnea last night, subsided once he used his inhaler and rested. No complaints at this time. Reported to Dr Truett Perna, no extra action at this time, if this does occur again, patient is to call if not relieved or go to local ER. Labs noted, per Dr Truett Perna, patient is to start KCL daily. Patient verbalized understanding.

## 2012-12-30 ENCOUNTER — Telehealth: Payer: Self-pay | Admitting: Oncology

## 2012-12-30 NOTE — Telephone Encounter (Signed)
Faxed pt medical records,slides and scans will be fedex'ed to [facility]- Dr. Chelsea Primus office.  After review they will call with appt.

## 2013-01-05 ENCOUNTER — Other Ambulatory Visit: Payer: Self-pay | Admitting: Oncology

## 2013-01-08 ENCOUNTER — Telehealth: Payer: Self-pay | Admitting: Oncology

## 2013-01-08 ENCOUNTER — Other Ambulatory Visit (HOSPITAL_BASED_OUTPATIENT_CLINIC_OR_DEPARTMENT_OTHER): Payer: Federal, State, Local not specified - PPO | Admitting: Lab

## 2013-01-08 ENCOUNTER — Ambulatory Visit (HOSPITAL_BASED_OUTPATIENT_CLINIC_OR_DEPARTMENT_OTHER): Payer: Federal, State, Local not specified - PPO | Admitting: Nurse Practitioner

## 2013-01-08 ENCOUNTER — Encounter: Payer: Federal, State, Local not specified - PPO | Admitting: Nutrition

## 2013-01-08 ENCOUNTER — Telehealth: Payer: Self-pay | Admitting: *Deleted

## 2013-01-08 ENCOUNTER — Inpatient Hospital Stay: Payer: Federal, State, Local not specified - PPO

## 2013-01-08 VITALS — BP 130/78 | HR 61 | Temp 98.3°F | Resp 20 | Ht 69.0 in | Wt 192.8 lb

## 2013-01-08 DIAGNOSIS — R630 Anorexia: Secondary | ICD-10-CM

## 2013-01-08 DIAGNOSIS — C762 Malignant neoplasm of abdomen: Secondary | ICD-10-CM

## 2013-01-08 DIAGNOSIS — C801 Malignant (primary) neoplasm, unspecified: Secondary | ICD-10-CM

## 2013-01-08 DIAGNOSIS — M549 Dorsalgia, unspecified: Secondary | ICD-10-CM

## 2013-01-08 LAB — CBC WITH DIFFERENTIAL/PLATELET
BASO%: 0.9 % (ref 0.0–2.0)
HCT: 40.2 % (ref 38.4–49.9)
MCHC: 34.4 g/dL (ref 32.0–36.0)
MONO#: 0.6 10*3/uL (ref 0.1–0.9)
NEUT%: 61.8 % (ref 39.0–75.0)
RDW: 15.6 % — ABNORMAL HIGH (ref 11.0–14.6)
WBC: 7.7 10*3/uL (ref 4.0–10.3)
lymph#: 2.1 10*3/uL (ref 0.9–3.3)

## 2013-01-08 LAB — COMPREHENSIVE METABOLIC PANEL (CC13)
ALT: 47 U/L (ref 0–55)
CO2: 27 mEq/L (ref 22–29)
Calcium: 8.1 mg/dL — ABNORMAL LOW (ref 8.4–10.4)
Chloride: 102 mEq/L (ref 98–109)
Sodium: 137 mEq/L (ref 136–145)
Total Protein: 7.1 g/dL (ref 6.4–8.3)

## 2013-01-08 MED ORDER — DIPHENOXYLATE-ATROPINE 2.5-0.025 MG PO TABS: 1.0000 | ORAL_TABLET | Freq: Four times a day (QID) | ORAL | Status: DC | PRN

## 2013-01-08 MED ORDER — MEGESTROL ACETATE 40 MG/ML PO SUSP: 200.0000 mg | Freq: Two times a day (BID) | ORAL | Status: DC

## 2013-01-08 MED ORDER — FENTANYL 75 MCG/HR TD PT72: 1.0000 | MEDICATED_PATCH | TRANSDERMAL | Status: DC

## 2013-01-08 NOTE — Telephone Encounter (Signed)
Per staff phone call and POF I have schedueld appts.  JMW  

## 2013-01-08 NOTE — Progress Notes (Addendum)
OFFICE PROGRESS NOTE  Interval history:  [name] returns as scheduled. He completed cycle 2 FOLFOX on 12/25/2012. He again had nausea beginning day 2 and lasting for 3-4 days. About 1 week ago he developed diarrhea. He estimates 5-6 loose stools per day. He is taking Imodium with no improvement. He has persistent cold sensitivity in the hands. No numbness or tingling in the absence of cold exposure. No mouth sores. Appetite is poor. He is losing weight. He states he has no energy. He has noted increased right-sided abdominal pain over the past several days and increased pain at the left mid back region. He has stable chronic upper and lower back pain. He is currently on a Duragesic patch 50 mcg every 3 days and takes oxycodone as needed. He denies leg weakness or numbness.   Objective: Blood pressure 130/78, pulse 61, temperature 98.3 F (36.8 C), temperature source Oral, resp. rate 20, height 5\' 9"  (1.753 m), weight 192 lb 12.8 oz (87.454 kg).  No thrush or ulcerations. Lungs clear. Regular cardiac rhythm. Port-A-Cath site without erythema. Abdomen is soft. No hepatomegaly. Extremities are without edema. Motor strength 5 over 5. Knee DTRs 2+, symmetric. Vibratory sense intact over the fingertips per tuning fork exam. Tenderness left mid back just lateral to the spine. No rash.  Lab Results: Lab Results  Component Value Date   WBC 7.7 01/08/2013   HGB 13.8 01/08/2013   HCT 40.2 01/08/2013   MCV 86.2 01/08/2013   PLT 196 01/08/2013    Chemistry:    Chemistry      Component Value Date/Time   NA 137 01/08/2013 1000   NA 129* 11/17/2012 0446   K 3.1* 01/08/2013 1000   K 3.9 11/17/2012 0446   CL 98 12/25/2012 0903   CL 97 11/17/2012 0446   CO2 27 01/08/2013 1000   CO2 26 11/17/2012 0446   BUN 5.4* 01/08/2013 1000   BUN 6 11/17/2012 0446   CREATININE 0.9 01/08/2013 1000   CREATININE 0.81 11/17/2012 0446   CREATININE 0.72 11/12/2012 1430      Component Value Date/Time   CALCIUM 8.1* 01/08/2013 1000   CALCIUM 8.7 11/17/2012 0446   ALKPHOS 98 01/08/2013 1000   ALKPHOS 124* 11/12/2012 1850   AST 26 01/08/2013 1000   AST 21 11/12/2012 1850   ALT 47 01/08/2013 1000   ALT 47 11/12/2012 1850   BILITOT 0.56 01/08/2013 1000   BILITOT 0.7 11/12/2012 1850       Studies/Results: No results found.  Medications: I have reviewed the patient's current medications.  Assessment/Plan:  1. 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 CT of the chest 11/18/2012 with borderline enlarged mediastinal nodes, gastrohepatic ligament nodes and omental/peritoneal nodularity. Negative upper endoscopy. Normal CEA on 12/13/2012. Status post cycle 1 FOLFOX 12/11/2012. Status post cycle 2 FOLFOX 12/25/2012. 2. Cecal/appendix mass noted at the time of exploratory laparotomy 11/13/2012. 3. COPD. 4. Pain secondary to carcinomatosis. 5. Chronic back pain. Stable.  6. Status post Port-A-Cath placement 12/02/2012. 7. Delayed nausea following cycle 1 FOLFOX. Aloxi was added with cycle 2. He had delayed nausea following cycle 2. We will add Emend with cycle 3. 8. Diarrhea. Question related to chemotherapy. He will try Lomotil. 9. Left mid back pain/tenderness just lateral to the spine. Question etiology. We will increase the Duragesic patch to 75 mcg every 3 days. He will take oxycodone 1-2 tablets every 4 hours as needed. He  was instructed to contact the office if the pain worsens or he develops any neurologic symptoms. 10. Anorexia/weight loss. He will begin a trial of Megace.  Disposition-[name] has completed 2 cycles of FOLFOX chemotherapy. He is having increased back pain and diarrhea. We adjusted his pain medication regimen as outlined above and prescribe Lomotil for the diarrhea. Dr. Truett Perna recommends holding today's chemotherapy and rescheduling for one week. We will see him  prior to treatment on 01/15/2013. He will contact the office in the interim as outlined above or with any other problems.  Patient seen with Dr. Truett Perna.  Lonna Cobb ANP/GNP-BC  [name] was interviewed and examined. He has completed 2 cycles of FOLFOX. He reports frequent diarrhea following cycle 2 that continues. Chemotherapy will be held today. [name] has developed increased back pain over the past several days. There is tenderness at the level of the mid thoracic spine to the left of midline. It is not clear whether the pain is related to the metastatic carcinoma or a benign musculoskeletal condition.  He will contact us if the pain worsens and we will arrange for imaging of the thoracic spine. [name] will return for an office visit in one week.  Mancel Bale, M.D.

## 2013-01-08 NOTE — Telephone Encounter (Signed)
gv and printed appt sched and avs for pt...MW added tx....pt did not wanna stay for NUT and requested to see her while in tx.Marland KitchenMarland KitchenMarland KitchenDone

## 2013-01-12 ENCOUNTER — Other Ambulatory Visit: Payer: Self-pay | Admitting: Oncology

## 2013-01-15 ENCOUNTER — Ambulatory Visit (HOSPITAL_BASED_OUTPATIENT_CLINIC_OR_DEPARTMENT_OTHER): Payer: Federal, State, Local not specified - PPO

## 2013-01-15 ENCOUNTER — Telehealth: Payer: Self-pay | Admitting: *Deleted

## 2013-01-15 ENCOUNTER — Other Ambulatory Visit: Payer: Self-pay | Admitting: Oncology

## 2013-01-15 ENCOUNTER — Telehealth: Payer: Self-pay | Admitting: Oncology

## 2013-01-15 ENCOUNTER — Ambulatory Visit (HOSPITAL_BASED_OUTPATIENT_CLINIC_OR_DEPARTMENT_OTHER): Payer: Federal, State, Local not specified - PPO | Admitting: Nurse Practitioner

## 2013-01-15 ENCOUNTER — Other Ambulatory Visit (HOSPITAL_BASED_OUTPATIENT_CLINIC_OR_DEPARTMENT_OTHER): Payer: Federal, State, Local not specified - PPO | Admitting: Lab

## 2013-01-15 VITALS — BP 132/62 | HR 55 | Temp 98.5°F | Resp 20 | Ht 69.0 in | Wt 201.0 lb

## 2013-01-15 DIAGNOSIS — C50919 Malignant neoplasm of unspecified site of unspecified female breast: Secondary | ICD-10-CM

## 2013-01-15 DIAGNOSIS — G893 Neoplasm related pain (acute) (chronic): Secondary | ICD-10-CM

## 2013-01-15 DIAGNOSIS — C801 Malignant (primary) neoplasm, unspecified: Secondary | ICD-10-CM

## 2013-01-15 DIAGNOSIS — C779 Secondary and unspecified malignant neoplasm of lymph node, unspecified: Secondary | ICD-10-CM

## 2013-01-15 DIAGNOSIS — Z5111 Encounter for antineoplastic chemotherapy: Secondary | ICD-10-CM

## 2013-01-15 LAB — CBC WITH DIFFERENTIAL/PLATELET
BASO%: 0.4 % (ref 0.0–2.0)
LYMPH%: 45.3 % (ref 14.0–49.0)
MCHC: 34.8 g/dL (ref 32.0–36.0)
MONO#: 0.6 10*3/uL (ref 0.1–0.9)
RBC: 4.61 10*6/uL (ref 4.20–5.82)
WBC: 5 10*3/uL (ref 4.0–10.3)
lymph#: 2.3 10*3/uL (ref 0.9–3.3)
nRBC: 0 % (ref 0–0)

## 2013-01-15 LAB — COMPREHENSIVE METABOLIC PANEL (CC13)
ALT: 20 U/L (ref 0–55)
AST: 31 U/L (ref 5–34)
Alkaline Phosphatase: 78 U/L (ref 40–150)
Calcium: 10 mg/dL (ref 8.4–10.4)
Chloride: 102 mEq/L (ref 98–109)
Creatinine: 0.8 mg/dL (ref 0.7–1.3)

## 2013-01-15 MED ORDER — SODIUM CHLORIDE 0.9 % IV SOLN
1920.0000 mg/m2 | INTRAVENOUS | Status: DC
  Administered 2013-01-15: 4050 mg via INTRAVENOUS
  Filled 2013-01-15: qty 81

## 2013-01-15 MED ORDER — DEXAMETHASONE SODIUM PHOSPHATE 10 MG/ML IJ SOLN
10.0000 mg | Freq: Once | INTRAMUSCULAR | Status: AC
  Administered 2013-01-15: 10 mg via INTRAVENOUS

## 2013-01-15 MED ORDER — PALONOSETRON HCL INJECTION 0.25 MG/5ML
0.2500 mg | Freq: Once | INTRAVENOUS | Status: AC
  Administered 2013-01-15: 0.25 mg via INTRAVENOUS

## 2013-01-15 MED ORDER — DEXTROSE 5 % IV SOLN
Freq: Once | INTRAVENOUS | Status: AC
  Administered 2013-01-15: 13:00:00 via INTRAVENOUS

## 2013-01-15 MED ORDER — SODIUM CHLORIDE 0.9 % IV SOLN
150.0000 mg | Freq: Once | INTRAVENOUS | Status: AC
  Administered 2013-01-15: 150 mg via INTRAVENOUS
  Filled 2013-01-15: qty 5

## 2013-01-15 MED ORDER — LEUCOVORIN CALCIUM INJECTION 350 MG
401.0000 mg/m2 | Freq: Once | INTRAVENOUS | Status: AC
  Administered 2013-01-15: 850 mg via INTRAVENOUS
  Filled 2013-01-15: qty 42.5

## 2013-01-15 MED ORDER — OXALIPLATIN CHEMO INJECTION 100 MG/20ML
85.0000 mg/m2 | Freq: Once | INTRAVENOUS | Status: AC
  Administered 2013-01-15: 180 mg via INTRAVENOUS
  Filled 2013-01-15: qty 36

## 2013-01-15 NOTE — Telephone Encounter (Signed)
Per staff message and POF I have scheduled appts.  JMW  

## 2013-01-15 NOTE — Telephone Encounter (Signed)
gv and printed appt sched and avs to pt....emailed MW to add tx.Marland KitchenMarland KitchenMarland Kitchen

## 2013-01-15 NOTE — Telephone Encounter (Signed)
gv and printed appt sched and avs forlpt....emailed MW to ad tx.Marland Kitchen

## 2013-01-15 NOTE — Patient Instructions (Addendum)
[facility] Discharge Instructions for Patients Receiving Chemotherapy  Today you received the following chemotherapy agents Oxaliplatin/Leucovorin/5FU  To help prevent nausea and vomiting after your treatment, we encourage you to take your nausea medication as prescribed.   If you develop nausea and vomiting that is not controlled by your nausea medication, call the clinic.   BELOW ARE SYMPTOMS THAT SHOULD BE REPORTED IMMEDIATELY:  *FEVER GREATER THAN 100.5 F  *CHILLS WITH OR WITHOUT FEVER  NAUSEA AND VOMITING THAT IS NOT CONTROLLED WITH YOUR NAUSEA MEDICATION  *UNUSUAL SHORTNESS OF BREATH  *UNUSUAL BRUISING OR BLEEDING  TENDERNESS IN MOUTH AND THROAT WITH OR WITHOUT PRESENCE OF ULCERS  *URINARY PROBLEMS  *BOWEL PROBLEMS  UNUSUAL RASH Items with * indicate a potential emergency and should be followed up as soon as possible.  Feel free to call the clinic you have any questions or concerns. The clinic phone number is [phone].    

## 2013-01-15 NOTE — Progress Notes (Addendum)
OFFICE PROGRESS NOTE  Interval history:  [name] returns as scheduled. He completed cycle 2 FOLFOX on 12/25/2012. Cycle 3 was held on 01/08/2013 due to diarrhea and back pain. He is seen today for scheduled followup.  The back pain is significantly better. He has stable chronic back pain. The diarrhea is better. Lomotil was effective. He denies nausea/vomiting. Appetite has improved.   Objective: Blood pressure 132/62, pulse 55, temperature 98.5 F (36.9 C), temperature source Oral, resp. rate 20, height 5\' 9"  (1.753 m), weight 201 lb (91.173 kg).  No thrush or ulcerations. Lungs clear. Regular cardiac rhythm. Port-A-Cath site without erythema. Abdomen soft. No hepatomegaly. Extremities without edema.  Lab Results: Lab Results  Component Value Date   WBC 5.0 01/15/2013   HGB 13.7 01/15/2013   HCT 39.4 01/15/2013   MCV 85.5 01/15/2013   PLT 215 01/15/2013    Chemistry:    Chemistry      Component Value Date/Time   NA 137 01/08/2013 1000   NA 129* 11/17/2012 0446   K 3.1* 01/08/2013 1000   K 3.9 11/17/2012 0446   CL 98 12/25/2012 0903   CL 97 11/17/2012 0446   CO2 27 01/08/2013 1000   CO2 26 11/17/2012 0446   BUN 5.4* 01/08/2013 1000   BUN 6 11/17/2012 0446   CREATININE 0.9 01/08/2013 1000   CREATININE 0.81 11/17/2012 0446   CREATININE 0.72 11/12/2012 1430      Component Value Date/Time   CALCIUM 8.1* 01/08/2013 1000   CALCIUM 8.7 11/17/2012 0446   ALKPHOS 98 01/08/2013 1000   ALKPHOS 124* 11/12/2012 1850   AST 26 01/08/2013 1000   AST 21 11/12/2012 1850   ALT 47 01/08/2013 1000   ALT 47 11/12/2012 1850   BILITOT 0.56 01/08/2013 1000   BILITOT 0.7 11/12/2012 1850       Studies/Results: No results found.  Medications: I have reviewed the patient's current medications.  Assessment/Plan:  1. 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 CT of the  chest 11/18/2012 with borderline enlarged mediastinal nodes, gastrohepatic ligament nodes and omental/peritoneal nodularity. Negative upper endoscopy. Normal CEA on 12/13/2012. Status post cycle 1 FOLFOX 12/11/2012. Status post cycle 2 FOLFOX 12/25/2012. 2. Cecal/appendix mass noted at the time of exploratory laparotomy 11/13/2012. 3. COPD. 4. Pain secondary to carcinomatosis. 5. Chronic back pain. Stable.  6. Status post Port-A-Cath placement 12/02/2012. 7. Delayed nausea following cycle 1 FOLFOX. Aloxi was added with cycle 2. He had delayed nausea following cycle 2. Emend will be added with treatment today. 8. Diarrhea following cycle 2 FOLFOX. Question related to 5-fluorouracil. The diarrhea improved with Lomotil. 9. Left mid back pain/tenderness just lateral to the spine. Question etiology. Duragesic patch increased to 75 mcg every 3 days. He takes oxycodone as needed. The pain is better. 10. Anorexia/weight loss. He began a trial of Megace following office visit 01/08/2013. Appetite is better. He is gaining weight. 11. CA 19-9 elevated at 1139 on 12/25/2012.  Disposition-Frank Baird appears improved. The back pain and diarrhea are better. He has completed 2 cycles of FOLFOX chemotherapy. Plan to proceed with cycle 3 today as scheduled. Dr. Truett Perna recommends eliminating the 5-fluorouracil bolus and dose reducing the continuous infusion. He will return for a followup visit and cycle 4 FOLFOX in 2 weeks. We will obtain a followup CA 19-9 in 2 weeks. He will contact the office in the interim with any problems.   Lonna Cobb  ANP/GNP-BC   The diarrhea has resolved. He reports the onset of diarrhea a few days after the last cycle of chemotherapy. We will eliminate the bolus 5-FU and dose reduce the 5-FU infusion with this cycle.  His back pain has improved. He will continue the current narcotic regimen. The plan is to obtain a restaging CT evaluation after 5 cycles of FOLFOX.  Mancel Bale,  M.D.

## 2013-01-17 ENCOUNTER — Ambulatory Visit (HOSPITAL_BASED_OUTPATIENT_CLINIC_OR_DEPARTMENT_OTHER): Payer: Federal, State, Local not specified - PPO

## 2013-01-17 VITALS — BP 131/78 | HR 53 | Temp 98.1°F

## 2013-01-17 DIAGNOSIS — C801 Malignant (primary) neoplasm, unspecified: Secondary | ICD-10-CM

## 2013-01-17 MED ORDER — SODIUM CHLORIDE 0.9 % IJ SOLN
10.0000 mL | INTRAMUSCULAR | Status: DC | PRN
  Administered 2013-01-17: 10 mL
  Filled 2013-01-17: qty 10

## 2013-01-17 MED ORDER — HEPARIN SOD (PORK) LOCK FLUSH 100 UNIT/ML IV SOLN
500.0000 [IU] | Freq: Once | INTRAVENOUS | Status: AC | PRN
  Administered 2013-01-17: 500 [IU]
  Filled 2013-01-17: qty 5

## 2013-01-22 ENCOUNTER — Ambulatory Visit: Payer: Federal, State, Local not specified - PPO | Admitting: Oncology

## 2013-01-22 ENCOUNTER — Other Ambulatory Visit: Payer: Federal, State, Local not specified - PPO | Admitting: Lab

## 2013-01-22 ENCOUNTER — Inpatient Hospital Stay: Payer: Federal, State, Local not specified - PPO

## 2013-01-23 ENCOUNTER — Other Ambulatory Visit: Payer: Self-pay | Admitting: Oncology

## 2013-01-29 ENCOUNTER — Ambulatory Visit (HOSPITAL_BASED_OUTPATIENT_CLINIC_OR_DEPARTMENT_OTHER): Payer: Federal, State, Local not specified - PPO

## 2013-01-29 ENCOUNTER — Telehealth: Payer: Self-pay | Admitting: *Deleted

## 2013-01-29 ENCOUNTER — Telehealth: Payer: Self-pay | Admitting: Oncology

## 2013-01-29 ENCOUNTER — Ambulatory Visit: Payer: Federal, State, Local not specified - PPO | Admitting: Nutrition

## 2013-01-29 ENCOUNTER — Ambulatory Visit (HOSPITAL_BASED_OUTPATIENT_CLINIC_OR_DEPARTMENT_OTHER): Payer: Federal, State, Local not specified - PPO | Admitting: Nurse Practitioner

## 2013-01-29 ENCOUNTER — Other Ambulatory Visit (HOSPITAL_BASED_OUTPATIENT_CLINIC_OR_DEPARTMENT_OTHER): Payer: Federal, State, Local not specified - PPO | Admitting: Lab

## 2013-01-29 VITALS — BP 121/77 | HR 56 | Temp 97.1°F | Resp 18 | Ht 69.0 in | Wt 193.9 lb

## 2013-01-29 DIAGNOSIS — C801 Malignant (primary) neoplasm, unspecified: Secondary | ICD-10-CM

## 2013-01-29 DIAGNOSIS — C762 Malignant neoplasm of abdomen: Secondary | ICD-10-CM

## 2013-01-29 DIAGNOSIS — Z5111 Encounter for antineoplastic chemotherapy: Secondary | ICD-10-CM

## 2013-01-29 LAB — COMPREHENSIVE METABOLIC PANEL (CC13)
ALT: 44 U/L (ref 0–55)
AST: 56 U/L — ABNORMAL HIGH (ref 5–34)
CO2: 24 mEq/L (ref 22–29)
Calcium: 9.5 mg/dL (ref 8.4–10.4)
Chloride: 103 mEq/L (ref 98–109)
Sodium: 136 mEq/L (ref 136–145)
Total Bilirubin: 0.45 mg/dL (ref 0.20–1.20)
Total Protein: 7.2 g/dL (ref 6.4–8.3)

## 2013-01-29 LAB — CBC WITH DIFFERENTIAL/PLATELET
BASO%: 0.5 % (ref 0.0–2.0)
EOS%: 1.2 % (ref 0.0–7.0)
MCHC: 34.8 g/dL (ref 32.0–36.0)
MONO#: 0.7 10*3/uL (ref 0.1–0.9)
RBC: 4.67 10*6/uL (ref 4.20–5.82)
WBC: 7.8 10*3/uL (ref 4.0–10.3)
lymph#: 2 10*3/uL (ref 0.9–3.3)

## 2013-01-29 LAB — CANCER ANTIGEN 19-9: CA 19-9: 44.5 U/mL — ABNORMAL HIGH (ref <35.0)

## 2013-01-29 MED ORDER — DEXAMETHASONE SODIUM PHOSPHATE 10 MG/ML IJ SOLN
10.0000 mg | Freq: Once | INTRAMUSCULAR | Status: AC
  Administered 2013-01-29: 10 mg via INTRAVENOUS

## 2013-01-29 MED ORDER — PROCHLORPERAZINE MALEATE 10 MG PO TABS: 10.0000 mg | ORAL_TABLET | Freq: Four times a day (QID) | ORAL | Status: DC | PRN

## 2013-01-29 MED ORDER — OXALIPLATIN CHEMO INJECTION 100 MG/20ML
85.0000 mg/m2 | Freq: Once | INTRAVENOUS | Status: AC
  Administered 2013-01-29: 180 mg via INTRAVENOUS
  Filled 2013-01-29: qty 36

## 2013-01-29 MED ORDER — SODIUM CHLORIDE 0.9 % IV SOLN
Freq: Once | INTRAVENOUS | Status: AC
  Administered 2013-01-29: 12:00:00 via INTRAVENOUS

## 2013-01-29 MED ORDER — OXYCODONE HCL 10 MG PO TABS: 10.0000 mg | ORAL_TABLET | ORAL | Status: DC | PRN

## 2013-01-29 MED ORDER — PALONOSETRON HCL INJECTION 0.25 MG/5ML
0.2500 mg | Freq: Once | INTRAVENOUS | Status: AC
  Administered 2013-01-29: 0.25 mg via INTRAVENOUS

## 2013-01-29 MED ORDER — DEXTROSE 5 % IV SOLN
Freq: Once | INTRAVENOUS | Status: AC
  Administered 2013-01-29: 13:00:00 via INTRAVENOUS

## 2013-01-29 MED ORDER — FOSAPREPITANT DIMEGLUMINE INJECTION 150 MG
150.0000 mg | Freq: Once | INTRAVENOUS | Status: AC
  Administered 2013-01-29: 150 mg via INTRAVENOUS
  Filled 2013-01-29: qty 5

## 2013-01-29 MED ORDER — LORAZEPAM 1 MG PO TABS: 1.0000 mg | ORAL_TABLET | Freq: Four times a day (QID) | ORAL | Status: DC | PRN

## 2013-01-29 MED ORDER — FENTANYL 75 MCG/HR TD PT72: 1.0000 | MEDICATED_PATCH | TRANSDERMAL | Status: DC

## 2013-01-29 MED ORDER — LEUCOVORIN CALCIUM INJECTION 350 MG
401.0000 mg/m2 | Freq: Once | INTRAMUSCULAR | Status: AC
  Administered 2013-01-29: 850 mg via INTRAVENOUS
  Filled 2013-01-29: qty 42.5

## 2013-01-29 MED ORDER — FLUOROURACIL CHEMO INJECTION 5 GM/100ML
1920.0000 mg/m2 | INTRAVENOUS | Status: DC
  Administered 2013-01-29: 4050 mg via INTRAVENOUS
  Filled 2013-01-29: qty 81

## 2013-01-29 NOTE — Patient Instructions (Addendum)
[facility] Discharge Instructions for Patients Receiving Chemotherapy  Today you received the following chemotherapy agents Oxaliplatin, Leucovorin and Adrucil.  To help prevent nausea and vomiting after your treatment, we encourage you to take your nausea medication as prescribed.   If you develop nausea and vomiting that is not controlled by your nausea medication, call the clinic.   BELOW ARE SYMPTOMS THAT SHOULD BE REPORTED IMMEDIATELY:  *FEVER GREATER THAN 100.5 F  *CHILLS WITH OR WITHOUT FEVER  NAUSEA AND VOMITING THAT IS NOT CONTROLLED WITH YOUR NAUSEA MEDICATION  *UNUSUAL SHORTNESS OF BREATH  *UNUSUAL BRUISING OR BLEEDING  TENDERNESS IN MOUTH AND THROAT WITH OR WITHOUT PRESENCE OF ULCERS  *URINARY PROBLEMS  *BOWEL PROBLEMS  UNUSUAL RASH Items with * indicate a potential emergency and should be followed up as soon as possible.  Feel free to call the clinic you have any questions or concerns. The clinic phone number is [phone].    

## 2013-01-29 NOTE — Progress Notes (Signed)
OFFICE PROGRESS NOTE  Interval history:  Frank Baird returns as scheduled. He completed cycle 3 FOLFOX on 01/15/2013. He noted less nausea since receiving Aloxi and Emend. He notes that he wakes up with nausea most mornings and takes a Compazine with good relief. Diarrhea was less following cycle 3. He denies mouth sores. Cold sensitivity involving the mouth lasted 3-5 days and involving the hands and feet lasted until recently. No numbness or tingling in the hands or feet in the absence of cold exposure. Back pain is better since the Duragesic patch dose was increased.   Objective: Blood pressure 121/77, pulse 56, temperature 97.1 F (36.2 C), temperature source Oral, resp. rate 18, height 5\' 9"  (1.753 m), weight 193 lb 14.4 oz (87.952 kg).  Oropharynx is without thrush or ulceration. Lungs are clear. Regular cardiac rhythm. Port-A-Cath site without erythema. Abdomen is soft. Mildly tender at the right upper abdomen. No hepatomegaly. Extremities are without edema. Vibratory sense intact over the fingertips per tuning fork exam.  Lab Results: Lab Results  Component Value Date   WBC 7.8 01/29/2013   HGB 13.9 01/29/2013   HCT 39.9 01/29/2013   MCV 85.4 01/29/2013   PLT 218 01/29/2013    Chemistry:    Chemistry      Component Value Date/Time   NA 136 01/15/2013 1152   NA 129* 11/17/2012 0446   K 4.2 01/15/2013 1152   K 3.9 11/17/2012 0446   CL 98 12/25/2012 0903   CL 97 11/17/2012 0446   CO2 25 01/15/2013 1152   CO2 26 11/17/2012 0446   BUN 7.2 01/15/2013 1152   BUN 6 11/17/2012 0446   CREATININE 0.8 01/15/2013 1152   CREATININE 0.81 11/17/2012 0446   CREATININE 0.72 11/12/2012 1430      Component Value Date/Time   CALCIUM 10.0 01/15/2013 1152   CALCIUM 8.7 11/17/2012 0446   ALKPHOS 78 01/15/2013 1152   ALKPHOS 124* 11/12/2012 1850   AST 31 01/15/2013 1152   AST 21 11/12/2012 1850   ALT 20 01/15/2013 1152   ALT 47 11/12/2012 1850   BILITOT 0.46 01/15/2013 1152   BILITOT 0.7 11/12/2012 1850        Studies/Results: No results found.  Medications: I have reviewed the patient's current medications.  Assessment/Plan:  1. 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 CT of the chest 11/18/2012 with borderline enlarged mediastinal nodes, gastrohepatic ligament nodes and omental/peritoneal nodularity. Negative upper endoscopy. Normal CEA on 12/13/2012. Status post cycle 1 FOLFOX 12/11/2012. Status post cycle 2 FOLFOX 12/25/2012. Status post cycle 3 on 01/15/2013. 2. Cecal/appendix mass noted at the time of exploratory laparotomy 11/13/2012. 3. COPD. 4. Pain secondary to carcinomatosis. 5. Chronic back pain. Stable.  6. Status post Port-A-Cath placement 12/02/2012. 7. Delayed nausea following cycle 1 FOLFOX. Aloxi was added with cycle 2. He had delayed nausea following cycle 2. Emend was added beginning with cycle 3.  8. Diarrhea following cycle 2 FOLFOX. Question related to 5-fluorouracil. The diarrhea improved with Lomotil. The 5-fluorouracil bolus was eliminated and the dose of the 5 fluorouracil infusion was reduced beginning with cycle 3. He has less diarrhea following cycle 3. 9. Left mid back pain/tenderness just lateral to the spine. Question etiology. Duragesic patch increased to 75 mcg every 3 days. He takes oxycodone as needed. The pain is better. 10. Anorexia/weight loss. He began a trial of Megace following office visit 01/08/2013. Appetite is better. 11. CA  19-9 elevated at 1139 on 12/25/2012.  Disposition-Frank Baird appears stable. Plan to proceed with cycle 4 FOLFOX today as scheduled. We will followup on the CA 19-9 from today. He will return for a followup visit and cycle 5 in 2 weeks. He will contact the office in the interim with any problems.  Lonna Cobb ANP/GNP-BC

## 2013-01-29 NOTE — Telephone Encounter (Signed)
gv and printed appt sched and avs for pt...emaield MW to add tx.

## 2013-01-29 NOTE — Telephone Encounter (Signed)
Per staff message and POF I have scheduled appts.  JMW  

## 2013-01-29 NOTE — Progress Notes (Signed)
Patient states that he is doing well.  He has decreased appetite and some nausea for 10 days after chemotherapy.  At that time he will drink 2-3 Glucerna daily along with yogurt 2-3 times a day.  He does report taste alterations.  He still is uncomfortable when eating.  Nutrition diagnosis: Unintended weight loss continues.  Intervention: Patient was educated to increase Glucerna or other oral nutrition supplements on days when his appetite has decreased.  He was educated on the importance of weight maintenance.  I also educated him on strategies for dealing with taste alterations and nausea.  I provided him with samples of boost glucose control and no added sugar Carnation Instant Breakfast.  Monitoring, evaluation, goals: Patient will tolerate adequate oral intake to promote weight maintenance and adequate glycemic control.  Next visit:Wednesday, August 13 during chemotherapy.

## 2013-01-30 ENCOUNTER — Telehealth: Payer: Self-pay | Admitting: *Deleted

## 2013-01-30 NOTE — Telephone Encounter (Signed)
Message copied by Caleb Popp on Thu Jan 30, 2013  5:28 PM ------      Message from: Wandalee Ferdinand      Created: Thu Jan 30, 2013  4:46 PM      Regarding: FW: tumor marker                   ----- Message -----         From: Rana Snare, NP         Sent: 01/30/2013   8:56 AM           To: Wandalee Ferdinand, RN      Subject: tumor marker                                             Please let patient know tumor marker is better. Thanks.      ----- Message -----         From: [facility]         Sent: 01/29/2013  10:26 AM           To: Rana Snare, NP                   ------

## 2013-01-30 NOTE — Telephone Encounter (Signed)
Left message on voicemail for pt to call office for lab results. 

## 2013-01-31 ENCOUNTER — Ambulatory Visit (HOSPITAL_BASED_OUTPATIENT_CLINIC_OR_DEPARTMENT_OTHER): Payer: Federal, State, Local not specified - PPO

## 2013-01-31 VITALS — BP 118/70 | HR 54 | Temp 99.5°F | Resp 18

## 2013-01-31 DIAGNOSIS — C801 Malignant (primary) neoplasm, unspecified: Secondary | ICD-10-CM

## 2013-01-31 DIAGNOSIS — Z452 Encounter for adjustment and management of vascular access device: Secondary | ICD-10-CM

## 2013-01-31 MED ORDER — SODIUM CHLORIDE 0.9 % IJ SOLN
10.0000 mL | INTRAMUSCULAR | Status: DC | PRN
  Administered 2013-01-31: 10 mL
  Filled 2013-01-31: qty 10

## 2013-01-31 MED ORDER — HEPARIN SOD (PORK) LOCK FLUSH 100 UNIT/ML IV SOLN
500.0000 [IU] | Freq: Once | INTRAVENOUS | Status: AC | PRN
  Administered 2013-01-31: 500 [IU]
  Filled 2013-01-31: qty 5

## 2013-02-09 ENCOUNTER — Other Ambulatory Visit: Payer: Self-pay | Admitting: Oncology

## 2013-02-12 ENCOUNTER — Telehealth: Payer: Self-pay | Admitting: Oncology

## 2013-02-12 ENCOUNTER — Ambulatory Visit: Payer: Federal, State, Local not specified - PPO | Admitting: Nutrition

## 2013-02-12 ENCOUNTER — Ambulatory Visit (HOSPITAL_BASED_OUTPATIENT_CLINIC_OR_DEPARTMENT_OTHER): Payer: Federal, State, Local not specified - PPO

## 2013-02-12 ENCOUNTER — Ambulatory Visit (HOSPITAL_BASED_OUTPATIENT_CLINIC_OR_DEPARTMENT_OTHER): Payer: Federal, State, Local not specified - PPO | Admitting: Oncology

## 2013-02-12 ENCOUNTER — Telehealth: Payer: Self-pay | Admitting: *Deleted

## 2013-02-12 ENCOUNTER — Other Ambulatory Visit (HOSPITAL_BASED_OUTPATIENT_CLINIC_OR_DEPARTMENT_OTHER): Payer: Federal, State, Local not specified - PPO

## 2013-02-12 VITALS — BP 127/69 | HR 53 | Temp 98.1°F | Resp 18 | Ht 69.0 in | Wt 192.4 lb

## 2013-02-12 DIAGNOSIS — G893 Neoplasm related pain (acute) (chronic): Secondary | ICD-10-CM

## 2013-02-12 DIAGNOSIS — C801 Malignant (primary) neoplasm, unspecified: Secondary | ICD-10-CM

## 2013-02-12 DIAGNOSIS — C786 Secondary malignant neoplasm of retroperitoneum and peritoneum: Secondary | ICD-10-CM

## 2013-02-12 DIAGNOSIS — Z5111 Encounter for antineoplastic chemotherapy: Secondary | ICD-10-CM

## 2013-02-12 DIAGNOSIS — M549 Dorsalgia, unspecified: Secondary | ICD-10-CM

## 2013-02-12 LAB — CBC WITH DIFFERENTIAL/PLATELET
BASO%: 0.5 % (ref 0.0–2.0)
EOS%: 2.4 % (ref 0.0–7.0)
LYMPH%: 26.9 % (ref 14.0–49.0)
MCHC: 34 g/dL (ref 32.0–36.0)
MONO#: 0.7 10*3/uL (ref 0.1–0.9)
MONO%: 10.1 % (ref 0.0–14.0)
Platelets: 138 10*3/uL — ABNORMAL LOW (ref 140–400)
RBC: 4.84 10*6/uL (ref 4.20–5.82)
WBC: 6.7 10*3/uL (ref 4.0–10.3)
nRBC: 0 % (ref 0–0)

## 2013-02-12 LAB — COMPREHENSIVE METABOLIC PANEL (CC13)
BUN: 6.8 mg/dL — ABNORMAL LOW (ref 7.0–26.0)
CO2: 24 mEq/L (ref 22–29)
Calcium: 9.7 mg/dL (ref 8.4–10.4)
Chloride: 104 mEq/L (ref 98–109)
Creatinine: 0.8 mg/dL (ref 0.7–1.3)
Glucose: 120 mg/dl (ref 70–140)

## 2013-02-12 MED ORDER — SODIUM CHLORIDE 0.9 % IV SOLN
1920.0000 mg/m2 | INTRAVENOUS | Status: DC
  Administered 2013-02-12: 4050 mg via INTRAVENOUS
  Filled 2013-02-12: qty 81

## 2013-02-12 MED ORDER — SODIUM CHLORIDE 0.9 % IJ SOLN
10.0000 mL | INTRAMUSCULAR | Status: DC | PRN
  Filled 2013-02-12: qty 10

## 2013-02-12 MED ORDER — FOSAPREPITANT DIMEGLUMINE INJECTION 150 MG
150.0000 mg | Freq: Once | INTRAVENOUS | Status: AC
  Administered 2013-02-12: 150 mg via INTRAVENOUS
  Filled 2013-02-12: qty 5

## 2013-02-12 MED ORDER — HEPARIN SOD (PORK) LOCK FLUSH 100 UNIT/ML IV SOLN
500.0000 [IU] | Freq: Once | INTRAVENOUS | Status: DC | PRN
  Filled 2013-02-12: qty 5

## 2013-02-12 MED ORDER — OXALIPLATIN CHEMO INJECTION 100 MG/20ML
85.0000 mg/m2 | Freq: Once | INTRAVENOUS | Status: AC
  Administered 2013-02-12: 180 mg via INTRAVENOUS
  Filled 2013-02-12: qty 36

## 2013-02-12 MED ORDER — DEXTROSE 5 % IV SOLN
Freq: Once | INTRAVENOUS | Status: AC
  Administered 2013-02-12: 12:00:00 via INTRAVENOUS

## 2013-02-12 MED ORDER — PALONOSETRON HCL INJECTION 0.25 MG/5ML
0.2500 mg | Freq: Once | INTRAVENOUS | Status: AC
  Administered 2013-02-12: 0.25 mg via INTRAVENOUS

## 2013-02-12 MED ORDER — DEXTROSE 5 % IV SOLN
401.0000 mg/m2 | Freq: Once | INTRAVENOUS | Status: AC
  Administered 2013-02-12: 850 mg via INTRAVENOUS
  Filled 2013-02-12: qty 42.5

## 2013-02-12 MED ORDER — DEXAMETHASONE SODIUM PHOSPHATE 10 MG/ML IJ SOLN
10.0000 mg | Freq: Once | INTRAMUSCULAR | Status: AC
  Administered 2013-02-12: 10 mg via INTRAVENOUS

## 2013-02-12 NOTE — Telephone Encounter (Signed)
Per staff message and POF I have scheduled appts.  JMW  

## 2013-02-12 NOTE — Progress Notes (Signed)
   [facility]    OFFICE PROGRESS NOTE   INTERVAL HISTORY:   He returns as scheduled. He completed another cycle of FOLFOX on 01/29/2013. Nausea on day 4 but no emesis. One to 2 episodes of diarrhea. He continues to have back pain. He reports malaise. Cold sensitivity following chemotherapy. No neuropathy symptoms today.  Objective:  Vital signs in last 24 hours:  Blood pressure 127/69, pulse 53, temperature 98.1 F (36.7 C), temperature source Oral, resp. rate 18, height 5\' 9"  (1.753 m), weight 192 lb 6.4 oz (87.272 kg).    HEENT: No thrush or ulcers Resp: Lungs clear bilaterally Cardio:   Regular rate and rhythm GI: No hepatomegaly, tenderness in the right mid and lower abdomen. No mass. No apparent ascites. Vascular: No leg edema Neuro: The vibratory sense is intact at the fingertips bilaterally    Portacath/PICC-without erythema  Lab Results:  Lab Results  Component Value Date   WBC 6.7 02/12/2013   HGB 14.6 02/12/2013   HCT 42.9 02/12/2013   MCV 88.6 02/12/2013   PLT 138* 02/12/2013   ANC 4.0  CA 19-9 on 01/29/2013-44.5  Medications: I have reviewed the patient's current medications.  Assessment/Plan: 1. 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 CT of the chest 11/18/2012 with borderline enlarged mediastinal nodes, gastrohepatic ligament nodes and omental/peritoneal nodularity. Negative upper endoscopy. Normal CEA on 12/13/2012. Status post cycle 1 FOLFOX 12/11/2012. Status post cycle 2 FOLFOX 12/25/2012. Status post cycle 3 on 01/15/2013. 2. Cecal/appendix mass noted at the time of exploratory laparotomy 11/13/2012. 3. COPD. 4. Pain secondary to carcinomatosis. 5. Chronic back pain. Stable.  6. Status post Port-A-Cath placement 12/02/2012. 7. Delayed nausea following cycle 1 FOLFOX. Aloxi was added  with cycle 2. He had delayed nausea following cycle 2. Emend was added beginning with cycle 3.  8. Diarrhea following cycle 2 FOLFOX. Question related to 5-fluorouracil. The diarrhea improved with Lomotil. The 5-fluorouracil bolus was eliminated and the dose of the 5 fluorouracil infusion was reduced beginning with cycle 3.  9. Left mid back pain/tenderness just lateral to the spine. Question etiology. Duragesic patch increased to 75 mcg every 3 days. He takes oxycodone as needed. He continues to have back pain. 10. Anorexia/weight loss. He began a trial of Megace following office visit 01/08/2013. Appetite is better. 11. CA 19-9 elevated at 1139 on 12/25/2012. Improved on 01/29/2013  Disposition:  Mr. Kleve appears stable. He will complete cycle 5 of FOLFOX today. He appears to be tolerating the chemotherapy well. The CA 19-9 is improved. The plan is to complete 6 cycles of FOLFOX prior to a restaging CT. He will return for an office visit and cycle 6 in 2 weeks.   Thornton Papas, MD  02/12/2013  11:30 AM

## 2013-02-12 NOTE — Telephone Encounter (Signed)
gv and printed appt sched and avs for pt...emailed MW to add tx... °

## 2013-02-12 NOTE — Patient Instructions (Addendum)
[facility] Discharge Instructions for Patients Receiving Chemotherapy  Today you received the following chemotherapy agents Oxaliplatin, Leucovorin and Adrucil.  To help prevent nausea and vomiting after your treatment, we encourage you to take your nausea medication as prescribed.   If you develop nausea and vomiting that is not controlled by your nausea medication, call the clinic.   BELOW ARE SYMPTOMS THAT SHOULD BE REPORTED IMMEDIATELY:  *FEVER GREATER THAN 100.5 F  *CHILLS WITH OR WITHOUT FEVER  NAUSEA AND VOMITING THAT IS NOT CONTROLLED WITH YOUR NAUSEA MEDICATION  *UNUSUAL SHORTNESS OF BREATH  *UNUSUAL BRUISING OR BLEEDING  TENDERNESS IN MOUTH AND THROAT WITH OR WITHOUT PRESENCE OF ULCERS  *URINARY PROBLEMS  *BOWEL PROBLEMS  UNUSUAL RASH Items with * indicate a potential emergency and should be followed up as soon as possible.  Feel free to call the clinic you have any questions or concerns. The clinic phone number is [phone].    

## 2013-02-12 NOTE — Progress Notes (Signed)
I spoke with patient today in chemotherapy.  Patient reports he does not feel well after chemotherapy.  He continues to maintain his weight which was documented as 192.4 pounds on August 13.  He continues to drink oral nutrition supplements and eat yogurt after chemotherapy.  Taste alterations also continue.  Nutrition diagnosis: Unintended weight loss improved.  Intervention: I discussed strategies for patient on increasing oral intake after chemotherapy.  We discussed appropriate oral nutrition supplements.  Patient declined samples today.  Monitoring, evaluation, goals: Patient has tolerated oral intake to promote weight maintenance.  Next visit: Wednesday, August 27, during chemotherapy.

## 2013-02-14 ENCOUNTER — Ambulatory Visit (HOSPITAL_BASED_OUTPATIENT_CLINIC_OR_DEPARTMENT_OTHER): Payer: Federal, State, Local not specified - PPO

## 2013-02-14 VITALS — BP 115/67 | HR 55 | Temp 97.8°F | Resp 18

## 2013-02-14 DIAGNOSIS — Z452 Encounter for adjustment and management of vascular access device: Secondary | ICD-10-CM

## 2013-02-14 DIAGNOSIS — C801 Malignant (primary) neoplasm, unspecified: Secondary | ICD-10-CM

## 2013-02-14 MED ORDER — HEPARIN SOD (PORK) LOCK FLUSH 100 UNIT/ML IV SOLN
500.0000 [IU] | Freq: Once | INTRAVENOUS | Status: AC | PRN
  Administered 2013-02-14: 500 [IU]
  Filled 2013-02-14: qty 5

## 2013-02-14 MED ORDER — SODIUM CHLORIDE 0.9 % IJ SOLN
10.0000 mL | INTRAMUSCULAR | Status: DC | PRN
Start: 2013-02-14 — End: 2013-02-14
  Administered 2013-02-14: 10 mL
  Filled 2013-02-14: qty 10

## 2013-02-18 ENCOUNTER — Telehealth: Payer: Self-pay | Admitting: Oncology

## 2013-02-18 NOTE — Telephone Encounter (Signed)
Pt appt. With Dr. Lenis Noon @[facility]  is 03/05/13@9 :30. Medical records faxed,slides and scans fedex'ed. Pt is aware

## 2013-02-23 ENCOUNTER — Other Ambulatory Visit: Payer: Self-pay | Admitting: Oncology

## 2013-02-26 ENCOUNTER — Ambulatory Visit (HOSPITAL_BASED_OUTPATIENT_CLINIC_OR_DEPARTMENT_OTHER): Payer: Federal, State, Local not specified - PPO | Admitting: Nurse Practitioner

## 2013-02-26 ENCOUNTER — Telehealth: Payer: Self-pay | Admitting: Oncology

## 2013-02-26 ENCOUNTER — Other Ambulatory Visit (HOSPITAL_BASED_OUTPATIENT_CLINIC_OR_DEPARTMENT_OTHER): Payer: Federal, State, Local not specified - PPO | Admitting: Lab

## 2013-02-26 ENCOUNTER — Ambulatory Visit: Payer: Federal, State, Local not specified - PPO | Admitting: Nutrition

## 2013-02-26 ENCOUNTER — Ambulatory Visit (HOSPITAL_BASED_OUTPATIENT_CLINIC_OR_DEPARTMENT_OTHER): Payer: Federal, State, Local not specified - PPO

## 2013-02-26 VITALS — BP 131/61 | HR 59 | Temp 98.1°F | Resp 18 | Ht 69.0 in | Wt 191.9 lb

## 2013-02-26 DIAGNOSIS — C786 Secondary malignant neoplasm of retroperitoneum and peritoneum: Secondary | ICD-10-CM

## 2013-02-26 DIAGNOSIS — C801 Malignant (primary) neoplasm, unspecified: Secondary | ICD-10-CM

## 2013-02-26 DIAGNOSIS — C762 Malignant neoplasm of abdomen: Secondary | ICD-10-CM

## 2013-02-26 DIAGNOSIS — Z5111 Encounter for antineoplastic chemotherapy: Secondary | ICD-10-CM

## 2013-02-26 LAB — CBC WITH DIFFERENTIAL/PLATELET
Basophils Absolute: 0.1 10*3/uL (ref 0.0–0.1)
EOS%: 3.6 % (ref 0.0–7.0)
HGB: 14.4 g/dL (ref 13.0–17.1)
MCH: 30.3 pg (ref 27.2–33.4)
MCV: 89.1 fL (ref 79.3–98.0)
MONO%: 10.2 % (ref 0.0–14.0)
NEUT#: 3 10*3/uL (ref 1.5–6.5)
RBC: 4.75 10*6/uL (ref 4.20–5.82)
RDW: 16.2 % — ABNORMAL HIGH (ref 11.0–14.6)
lymph#: 2 10*3/uL (ref 0.9–3.3)

## 2013-02-26 LAB — COMPREHENSIVE METABOLIC PANEL (CC13)
AST: 36 U/L — ABNORMAL HIGH (ref 5–34)
Albumin: 3.6 g/dL (ref 3.5–5.0)
Alkaline Phosphatase: 69 U/L (ref 40–150)
BUN: 4.6 mg/dL — ABNORMAL LOW (ref 7.0–26.0)
Calcium: 9.6 mg/dL (ref 8.4–10.4)
Chloride: 103 mEq/L (ref 98–109)
Glucose: 115 mg/dl (ref 70–140)
Potassium: 4.7 mEq/L (ref 3.5–5.1)
Sodium: 137 mEq/L (ref 136–145)
Total Protein: 7.6 g/dL (ref 6.4–8.3)

## 2013-02-26 MED ORDER — LEUCOVORIN CALCIUM INJECTION 350 MG
401.0000 mg/m2 | Freq: Once | INTRAVENOUS | Status: AC
  Administered 2013-02-26: 850 mg via INTRAVENOUS
  Filled 2013-02-26: qty 42.5

## 2013-02-26 MED ORDER — LORAZEPAM 1 MG PO TABS: 1.0000 mg | ORAL_TABLET | Freq: Four times a day (QID) | ORAL | Status: DC | PRN

## 2013-02-26 MED ORDER — PALONOSETRON HCL INJECTION 0.25 MG/5ML
0.2500 mg | Freq: Once | INTRAVENOUS | Status: AC
  Administered 2013-02-26: 0.25 mg via INTRAVENOUS

## 2013-02-26 MED ORDER — SODIUM CHLORIDE 0.9 % IV SOLN
150.0000 mg | Freq: Once | INTRAVENOUS | Status: AC
  Administered 2013-02-26: 150 mg via INTRAVENOUS
  Filled 2013-02-26: qty 5

## 2013-02-26 MED ORDER — PROCHLORPERAZINE MALEATE 10 MG PO TABS: 10.0000 mg | ORAL_TABLET | Freq: Four times a day (QID) | ORAL | Status: DC | PRN

## 2013-02-26 MED ORDER — OXALIPLATIN CHEMO INJECTION 100 MG/20ML
85.0000 mg/m2 | Freq: Once | INTRAVENOUS | Status: AC
  Administered 2013-02-26: 180 mg via INTRAVENOUS
  Filled 2013-02-26: qty 36

## 2013-02-26 MED ORDER — SODIUM CHLORIDE 0.9 % IV SOLN
1920.0000 mg/m2 | INTRAVENOUS | Status: DC
  Administered 2013-02-26: 4050 mg via INTRAVENOUS
  Filled 2013-02-26: qty 81

## 2013-02-26 MED ORDER — DEXAMETHASONE SODIUM PHOSPHATE 10 MG/ML IJ SOLN
10.0000 mg | Freq: Once | INTRAMUSCULAR | Status: AC
  Administered 2013-02-26: 10 mg via INTRAVENOUS

## 2013-02-26 MED ORDER — DEXTROSE 5 % IV SOLN
Freq: Once | INTRAVENOUS | Status: AC
  Administered 2013-02-26: 11:00:00 via INTRAVENOUS

## 2013-02-26 MED ORDER — OXYCODONE HCL 10 MG PO TABS: 10.0000 mg | ORAL_TABLET | ORAL | Status: DC | PRN

## 2013-02-26 NOTE — Progress Notes (Signed)
Patient states he is "holding his own".  Weight is decreased slightly and was documented as 191.9 pounds on August 27.  He continues to have nausea and taste alterations.  He is struggling with eating later at night and then sleeping on a full stomach.  Nutrition diagnosis: Unintended weight loss continues.  Intervention:   Continue increased oral intake of nutrition supplements between meals. Choose easy to prepare foods for late night meals so he doesn't have to eat as late. Nausea medications as needed.  Monitoring, evaluation and goals: Tolerate increased oral intake to minimize further weight loss. Oral nutrition supplements as needed.  Next Visit: Wednesday, September 10.

## 2013-02-26 NOTE — Telephone Encounter (Signed)
Gave pt appt for lab, md and chemo for September 2014, gave pt oralm contrast

## 2013-02-26 NOTE — Progress Notes (Signed)
OFFICE PROGRESS NOTE  Interval history:  Frank Baird returns as scheduled. He completed cycle 5 FOLFOX on 02/12/2013. He had mild nausea. No vomiting. Few episodes of loose stools. No mouth sores. Cold sensitivity lasting 10-12 days in the fingertips and about 4 days in the mouth. No persistent neuropathy symptoms. Back pain overall is better. He continues a Duragesic patch with oxycodone as needed. Appetite is better. He has discontinued Megace.   Objective: Blood pressure 131/61, pulse 59, temperature 98.1 F (36.7 C), temperature source Oral, resp. rate 18, height 5\' 9"  (1.753 m), weight 191 lb 14.4 oz (87.045 kg), SpO2 99.00%.  Oropharynx is without thrush or ulceration. Globally diminished breath sounds. No wheezes. No respiratory distress. Regular cardiac rhythm. Port-A-Cath site is without erythema. Abdomen is soft, tender at the right mid abdomen. No hepatomegaly. No mass. Extremities without edema. Vibratory sense mildly decreased at the fingertips per tuning fork exam.  Lab Results: Lab Results  Component Value Date   WBC 5.8 02/26/2013   HGB 14.4 02/26/2013   HCT 42.3 02/26/2013   MCV 89.1 02/26/2013   PLT 140 02/26/2013    Chemistry:    Chemistry      Component Value Date/Time   NA 137 02/12/2013 0836   NA 129* 11/17/2012 0446   K 4.4 02/12/2013 0836   K 3.9 11/17/2012 0446   CL 98 12/25/2012 0903   CL 97 11/17/2012 0446   CO2 24 02/12/2013 0836   CO2 26 11/17/2012 0446   BUN 6.8* 02/12/2013 0836   BUN 6 11/17/2012 0446   CREATININE 0.8 02/12/2013 0836   CREATININE 0.81 11/17/2012 0446   CREATININE 0.72 11/12/2012 1430      Component Value Date/Time   CALCIUM 9.7 02/12/2013 0836   CALCIUM 8.7 11/17/2012 0446   ALKPHOS 88 02/12/2013 0836   ALKPHOS 124* 11/12/2012 1850   AST 21 02/12/2013 0836   AST 21 11/12/2012 1850   ALT 21 02/12/2013 0836   ALT 47 11/12/2012 1850   BILITOT 0.45 02/12/2013 0836   BILITOT 0.7 11/12/2012 1850       Studies/Results: No results  found.  Medications: I have reviewed the patient's current medications.  Assessment/Plan:  1. 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 CT of the chest 11/18/2012 with borderline enlarged mediastinal nodes, gastrohepatic ligament nodes and omental/peritoneal nodularity. Negative upper endoscopy. Normal CEA on 12/13/2012. Status post cycle 1 FOLFOX 12/11/2012. Status post cycle 2 FOLFOX 12/25/2012. Status post cycle 3 on 01/15/2013. Cycle 4 on 01/29/2013; cycle 5 on 02/12/2013. 2. Cecal/appendix mass noted at the time of exploratory laparotomy 11/13/2012. 3. COPD. 4. Pain secondary to carcinomatosis. 5. Chronic back pain. Stable.  6. Status post Port-A-Cath placement 12/02/2012. 7. Delayed nausea following cycle 1 FOLFOX. Aloxi was added with cycle 2. He had delayed nausea following cycle 2. Emend was added beginning with cycle 3.  8. Diarrhea following cycle 2 FOLFOX. Question related to 5-fluorouracil. The diarrhea improved with Lomotil. The 5-fluorouracil bolus was eliminated and the dose of the 5 fluorouracil infusion was reduced beginning with cycle 3.  9. Left mid back pain/tenderness just lateral to the spine. Question etiology. Duragesic patch increased to 75 mcg every 3 days. He takes oxycodone as needed. He continues to have back pain. 10. Anorexia/weight loss. He began a trial of Megace following office visit 01/08/2013. Appetite is better. He has discontinued Megace. 11. CA 19-9 elevated at 1139 on 12/25/2012. Improved  on 01/29/2013.  Disposition-Frank Baird appears stable. He has completed 5 cycles of FOLFOX. The CEA was improved after 3 cycles. Plan to proceed with cycle 6 FOLFOX today as scheduled. We are referring him for restaging CT scans 03/07/2013. He will return for a followup visit on 03/11/2013. He will contact the office in  the interim with any problems.  Plan reviewed with Dr. Truett Perna.  Lonna Cobb ANP/GNP-BC

## 2013-02-26 NOTE — Patient Instructions (Addendum)
[facility] Discharge Instructions for Patients Receiving Chemotherapy  Today you received the following chemotherapy agents Oxaliplatin/Leucovorin/5FU  To help prevent nausea and vomiting after your treatment, we encourage you to take your nausea medication as prescribed.   If you develop nausea and vomiting that is not controlled by your nausea medication, call the clinic.   BELOW ARE SYMPTOMS THAT SHOULD BE REPORTED IMMEDIATELY:  *FEVER GREATER THAN 100.5 F  *CHILLS WITH OR WITHOUT FEVER  NAUSEA AND VOMITING THAT IS NOT CONTROLLED WITH YOUR NAUSEA MEDICATION  *UNUSUAL SHORTNESS OF BREATH  *UNUSUAL BRUISING OR BLEEDING  TENDERNESS IN MOUTH AND THROAT WITH OR WITHOUT PRESENCE OF ULCERS  *URINARY PROBLEMS  *BOWEL PROBLEMS  UNUSUAL RASH Items with * indicate a potential emergency and should be followed up as soon as possible.  Feel free to call the clinic you have any questions or concerns. The clinic phone number is [phone].    

## 2013-02-27 LAB — CANCER ANTIGEN 19-9: CA 19-9: 25.4 U/mL (ref <35.0)

## 2013-02-28 ENCOUNTER — Ambulatory Visit (HOSPITAL_BASED_OUTPATIENT_CLINIC_OR_DEPARTMENT_OTHER): Payer: Federal, State, Local not specified - PPO

## 2013-02-28 ENCOUNTER — Telehealth: Payer: Self-pay | Admitting: *Deleted

## 2013-02-28 VITALS — BP 138/78 | HR 56 | Temp 97.9°F

## 2013-02-28 DIAGNOSIS — C801 Malignant (primary) neoplasm, unspecified: Secondary | ICD-10-CM

## 2013-02-28 MED ORDER — SODIUM CHLORIDE 0.9 % IJ SOLN
10.0000 mL | INTRAMUSCULAR | Status: DC | PRN
  Administered 2013-02-28: 10 mL
  Filled 2013-02-28: qty 10

## 2013-02-28 MED ORDER — HEPARIN SOD (PORK) LOCK FLUSH 100 UNIT/ML IV SOLN
500.0000 [IU] | Freq: Once | INTRAVENOUS | Status: AC | PRN
  Administered 2013-02-28: 500 [IU]
  Filled 2013-02-28: qty 5

## 2013-02-28 NOTE — Telephone Encounter (Signed)
Notified of improved CA 19.9 results.

## 2013-03-04 DIAGNOSIS — C181 Malignant neoplasm of appendix: Secondary | ICD-10-CM | POA: Insufficient documentation

## 2013-03-07 ENCOUNTER — Encounter (HOSPITAL_COMMUNITY): Payer: Self-pay

## 2013-03-07 ENCOUNTER — Ambulatory Visit (HOSPITAL_COMMUNITY)
Admission: RE | Admit: 2013-03-07 | Discharge: 2013-03-07 | Disposition: A | Discharge status: Home / Self Care | Payer: Federal, State, Local not specified - PPO | Source: Ambulatory Visit | Attending: Nurse Practitioner | Admitting: Nurse Practitioner

## 2013-03-07 DIAGNOSIS — C762 Malignant neoplasm of abdomen: Secondary | ICD-10-CM

## 2013-03-07 DIAGNOSIS — Z9089 Acquired absence of other organs: Secondary | ICD-10-CM | POA: Insufficient documentation

## 2013-03-07 DIAGNOSIS — C801 Malignant (primary) neoplasm, unspecified: Secondary | ICD-10-CM | POA: Insufficient documentation

## 2013-03-07 DIAGNOSIS — I700 Atherosclerosis of aorta: Secondary | ICD-10-CM | POA: Insufficient documentation

## 2013-03-07 MED ORDER — IOHEXOL 300 MG/ML  SOLN
100.0000 mL | Freq: Once | INTRAMUSCULAR | Status: AC | PRN
  Administered 2013-03-07: 100 mL via INTRAVENOUS

## 2013-03-09 ENCOUNTER — Other Ambulatory Visit: Payer: Self-pay | Admitting: Oncology

## 2013-03-12 ENCOUNTER — Ambulatory Visit (HOSPITAL_BASED_OUTPATIENT_CLINIC_OR_DEPARTMENT_OTHER): Payer: Federal, State, Local not specified - PPO | Admitting: Nurse Practitioner

## 2013-03-12 ENCOUNTER — Ambulatory Visit: Payer: Federal, State, Local not specified - PPO | Admitting: Nutrition

## 2013-03-12 ENCOUNTER — Telehealth: Payer: Self-pay | Admitting: *Deleted

## 2013-03-12 ENCOUNTER — Telehealth: Payer: Self-pay | Admitting: Oncology

## 2013-03-12 ENCOUNTER — Ambulatory Visit (HOSPITAL_BASED_OUTPATIENT_CLINIC_OR_DEPARTMENT_OTHER): Payer: Federal, State, Local not specified - PPO

## 2013-03-12 ENCOUNTER — Other Ambulatory Visit (HOSPITAL_BASED_OUTPATIENT_CLINIC_OR_DEPARTMENT_OTHER): Payer: Federal, State, Local not specified - PPO | Admitting: Lab

## 2013-03-12 VITALS — BP 137/76 | HR 47 | Temp 97.8°F | Resp 20 | Ht 69.0 in | Wt 187.8 lb

## 2013-03-12 DIAGNOSIS — C786 Secondary malignant neoplasm of retroperitoneum and peritoneum: Secondary | ICD-10-CM

## 2013-03-12 DIAGNOSIS — C801 Malignant (primary) neoplasm, unspecified: Secondary | ICD-10-CM

## 2013-03-12 DIAGNOSIS — M549 Dorsalgia, unspecified: Secondary | ICD-10-CM

## 2013-03-12 DIAGNOSIS — C762 Malignant neoplasm of abdomen: Secondary | ICD-10-CM

## 2013-03-12 DIAGNOSIS — Z5111 Encounter for antineoplastic chemotherapy: Secondary | ICD-10-CM

## 2013-03-12 LAB — COMPREHENSIVE METABOLIC PANEL (CC13)
AST: 23 U/L (ref 5–34)
Albumin: 3.6 g/dL (ref 3.5–5.0)
BUN: 4.4 mg/dL — ABNORMAL LOW (ref 7.0–26.0)
Calcium: 9.9 mg/dL (ref 8.4–10.4)
Chloride: 104 mEq/L (ref 98–109)
Glucose: 116 mg/dl (ref 70–140)
Potassium: 4.2 mEq/L (ref 3.5–5.1)

## 2013-03-12 LAB — CBC WITH DIFFERENTIAL/PLATELET
Basophils Absolute: 0 10*3/uL (ref 0.0–0.1)
HCT: 40.7 % (ref 38.4–49.9)
HGB: 14.3 g/dL (ref 13.0–17.1)
MONO#: 0.6 10*3/uL (ref 0.1–0.9)
NEUT%: 60.5 % (ref 39.0–75.0)
WBC: 6.7 10*3/uL (ref 4.0–10.3)
lymph#: 1.9 10*3/uL (ref 0.9–3.3)

## 2013-03-12 MED ORDER — PALONOSETRON HCL INJECTION 0.25 MG/5ML
INTRAVENOUS | Status: AC
  Administered 2013-03-12: 0.25 mg via INTRAVENOUS
  Filled 2013-03-12: qty 5

## 2013-03-12 MED ORDER — LEUCOVORIN CALCIUM INJECTION 350 MG
401.0000 mg/m2 | Freq: Once | INTRAVENOUS | Status: AC
  Administered 2013-03-12: 850 mg via INTRAVENOUS
  Filled 2013-03-12: qty 42.5

## 2013-03-12 MED ORDER — DEXAMETHASONE SODIUM PHOSPHATE 10 MG/ML IJ SOLN
10.0000 mg | Freq: Once | INTRAMUSCULAR | Status: AC
  Administered 2013-03-12: 10 mg via INTRAVENOUS

## 2013-03-12 MED ORDER — FENTANYL 75 MCG/HR TD PT72: 1.0000 | MEDICATED_PATCH | TRANSDERMAL | Status: DC

## 2013-03-12 MED ORDER — OXALIPLATIN CHEMO INJECTION 100 MG/20ML
85.0000 mg/m2 | Freq: Once | INTRAVENOUS | Status: AC
  Administered 2013-03-12: 180 mg via INTRAVENOUS
  Filled 2013-03-12: qty 36

## 2013-03-12 MED ORDER — SODIUM CHLORIDE 0.9 % IV SOLN
1920.0000 mg/m2 | INTRAVENOUS | Status: DC
  Administered 2013-03-12: 4050 mg via INTRAVENOUS
  Filled 2013-03-12: qty 81

## 2013-03-12 MED ORDER — PALONOSETRON HCL INJECTION 0.25 MG/5ML
0.2500 mg | Freq: Once | INTRAVENOUS | Status: AC
  Administered 2013-03-12: 0.25 mg via INTRAVENOUS

## 2013-03-12 MED ORDER — DEXAMETHASONE SODIUM PHOSPHATE 10 MG/ML IJ SOLN
INTRAMUSCULAR | Status: AC
  Administered 2013-03-12: 10 mg via INTRAVENOUS
  Filled 2013-03-12: qty 1

## 2013-03-12 MED ORDER — LIDOCAINE-PRILOCAINE 2.5-2.5 % EX CREA: TOPICAL_CREAM | CUTANEOUS | Status: DC

## 2013-03-12 MED ORDER — SODIUM CHLORIDE 0.9 % IV SOLN
150.0000 mg | Freq: Once | INTRAVENOUS | Status: AC
  Administered 2013-03-12: 150 mg via INTRAVENOUS
  Filled 2013-03-12: qty 5

## 2013-03-12 MED ORDER — DEXTROSE 5 % IV SOLN
Freq: Once | INTRAVENOUS | Status: AC
  Administered 2013-03-12: 12:00:00 via INTRAVENOUS

## 2013-03-12 NOTE — Patient Instructions (Signed)
[facility] Discharge Instructions for Patients Receiving Chemotherapy  Today you received the following chemotherapy agents Oxaliplatin, Leucovorin, 5FU pump.  To help prevent nausea and vomiting after your treatment, we encourage you to take your nausea medication as prescribed.   If you develop nausea and vomiting that is not controlled by your nausea medication, call the clinic.   BELOW ARE SYMPTOMS THAT SHOULD BE REPORTED IMMEDIATELY:  *FEVER GREATER THAN 100.5 F  *CHILLS WITH OR WITHOUT FEVER  NAUSEA AND VOMITING THAT IS NOT CONTROLLED WITH YOUR NAUSEA MEDICATION  *UNUSUAL SHORTNESS OF BREATH  *UNUSUAL BRUISING OR BLEEDING  TENDERNESS IN MOUTH AND THROAT WITH OR WITHOUT PRESENCE OF ULCERS  *URINARY PROBLEMS  *BOWEL PROBLEMS  UNUSUAL RASH Items with * indicate a potential emergency and should be followed up as soon as possible.  Feel free to call the clinic you have any questions or concerns. The clinic phone number is [phone].

## 2013-03-12 NOTE — Telephone Encounter (Signed)
Calledf pt and left message regarding MD appt being r/s from 9/22 to 10/7, MD on call

## 2013-03-12 NOTE — Progress Notes (Addendum)
OFFICE PROGRESS NOTE  Interval history:  [name] returns as scheduled. He completed cycle 6 FOLFOX on 02/26/2013. The CA 19-9 tumor marker returned further improved on 02/26/2013 at 25.4. Restaging CT evaluation on 03/07/2013 showed resolution of previous right upper lobe subpleural groundglass nodule. Stable 2 mm left upper lobe nodule; a subcarinal lymph node measured 1.1 cm as compared to 0.9 cm previously. A paraesophageal lymph node measured 1.6 m as compared to 1.2 cm previously. There was resolution of a previous right lower quadrant abscess. Similar appearance of prominent upper abdominal lymph nodes. A few small peritoneal nodules were identified in the region of the falciform ligament. Since the previous exam there was resolution of abdominal and pelvic ascites.  He had nausea following most recent cycle of chemotherapy. No vomiting. He took Compazine as needed. He has intermittent loose stools. He denies mouth sores. He cold sensitivity lasting approximately 10 days. No persistent neuropathy symptoms. Stable chronic upper back pain. Persistent pain at the mid to lower back. He has intermittent pain at the right mid to lower abdomen.   Objective: Blood pressure 137/76, pulse 47, temperature 97.8 F (36.6 C), temperature source Oral, resp. rate 20, height 5\' 9"  (1.753 m), weight 187 lb 12.8 oz (85.186 kg).  No thrush. Lungs are clear. Regular cardiac rhythm. Port-A-Cath site without erythema. Abdomen is soft with tenderness at the right mid abdomen. No hepatomegaly. No mass. No fluid wave. Extremities are without edema.  Lab Results: Lab Results  Component Value Date   WBC 6.7 03/12/2013   HGB 14.3 03/12/2013   HCT 40.7 03/12/2013   MCV 89.3 03/12/2013   PLT 140 03/12/2013    Chemistry:    Chemistry      Component Value Date/Time   NA 137 02/26/2013 0859   NA 129* 11/17/2012 0446   K 4.7 02/26/2013 0859   K 3.9 11/17/2012 0446   CL 98 12/25/2012 0903   CL 97 11/17/2012 0446   CO2  24 02/26/2013 0859   CO2 26 11/17/2012 0446   BUN 4.6* 02/26/2013 0859   BUN 6 11/17/2012 0446   CREATININE 0.8 02/26/2013 0859   CREATININE 0.81 11/17/2012 0446   CREATININE 0.72 11/12/2012 1430      Component Value Date/Time   CALCIUM 9.6 02/26/2013 0859   CALCIUM 8.7 11/17/2012 0446   ALKPHOS 69 02/26/2013 0859   ALKPHOS 124* 11/12/2012 1850   AST 36* 02/26/2013 0859   AST 21 11/12/2012 1850   ALT 24 02/26/2013 0859   ALT 47 11/12/2012 1850   BILITOT 0.50 02/26/2013 0859   BILITOT 0.7 11/12/2012 1850       Studies/Results: Ct Chest W Contrast  03/07/2013   *RADIOLOGY REPORT*  Clinical Data:  Restaging metastatic adenocarcinoma.  Colon cancer  CT CHEST, ABDOMEN AND PELVIS WITH CONTRAST  Technique:  Multidetector CT imaging of the chest, abdomen and pelvis was performed following the standard protocol during bolus administration of intravenous contrast.  Contrast: OMNIPAQUE IOHEXOL 300 MG/ML  SOLN  Comparison:  11/12/2012  CT CHEST  Findings:  No pleural effusion identified.  Resolution of previous right upper lobe subpleural ground-glass nodule.  There is also been resolution of subsegmental atelectasis involving the right lower lobe.  Stable tiny left upper lobe nodule measuring 2 mm, image 20/series 5.  The path no new or enlarging pulmonary nodules or masses.  Normal heart size.  No pericardial effusion.  Subcarinal lymph node measures 1.1 cm, image 36/series 2.  This is increased from 0.9  cm previously. The para esophageal lymph node measures 1.6 cm, image 54/series 2.  Previously 1.2 cm.  No axillary or supraclavicular adenopathy.  There are no worrisome lytic or sclerotic bone lesions identified.  IMPRESSION:  1.  Resolution of previous right upper lobe subpleural ground-glass nodule. 2.  Stable 2 mm left upper lobe nodule. 3. Increase in size of subcarinal and para esophageal lymph nodes.  CT ABDOMEN AND PELVIS  Findings:  No suspicious liver abnormality.  The patient is status post  cholecystectomy.  No biliary dilatation.  Normal appearance of the pancreas.  The spleen is normal.  The adrenal glands are both unremarkable.  Normal appearance of the kidneys.  Urinary bladder appears within normal limits.  The prostate gland and seminal vesicles are negative.  Calcified atherosclerotic disease affects the abdominal aorta.  There is no aneurysm. Prominent upper abdominal lymph nodes are identified. Gastrohepatic ligament lymph node measures 1 cm, image 64/series 2. Previously this measured 1 cm.  No pelvic or inguinal adenopathy noted.  No free fluid or abnormal fluid collections identified.  The stomach appears within normal limits.  The small bowel loops have a normal course and caliber without obstruction.  There has been interval resolution of previous right lower quadrant abscess. The colon is within normal limits.  No obstructing mass identified.  There has been resolution of previous upper abdominal ascites.  A few small peritoneal nodules are identified on today's exam.  Index nodules in the region of the falciform ligament are identified, image 78/series 2.  Review of the visualized osseous structures demonstrates no aggressive lytic or sclerotic bone lesions.  IMPRESSION:  1.  Resolution of previous right lower quadrant abscess. 2.  Similar appearance of prominent upper abdominal lymph nodes. 3.  A few small peritoneal nodules are identified in the region of the falciform ligament.  Since the previous exam there has been resolution of abdominal and pelvic ascites.   Original Report Authenticated By: Signa Kell, M.D.   Ct Abdomen Pelvis W Contrast  03/07/2013   *RADIOLOGY REPORT*  Clinical Data:  Restaging metastatic adenocarcinoma.  Colon cancer  CT CHEST, ABDOMEN AND PELVIS WITH CONTRAST  Technique:  Multidetector CT imaging of the chest, abdomen and pelvis was performed following the standard protocol during bolus administration of intravenous contrast.  Contrast: OMNIPAQUE  IOHEXOL 300 MG/ML  SOLN  Comparison:  11/12/2012  CT CHEST  Findings:  No pleural effusion identified.  Resolution of previous right upper lobe subpleural ground-glass nodule.  There is also been resolution of subsegmental atelectasis involving the right lower lobe.  Stable tiny left upper lobe nodule measuring 2 mm, image 20/series 5.  The path no new or enlarging pulmonary nodules or masses.  Normal heart size.  No pericardial effusion.  Subcarinal lymph node measures 1.1 cm, image 36/series 2.  This is increased from 0.9 cm previously. The para esophageal lymph node measures 1.6 cm, image 54/series 2.  Previously 1.2 cm.  No axillary or supraclavicular adenopathy.  There are no worrisome lytic or sclerotic bone lesions identified.  IMPRESSION:  1.  Resolution of previous right upper lobe subpleural ground-glass nodule. 2.  Stable 2 mm left upper lobe nodule. 3. Increase in size of subcarinal and para esophageal lymph nodes.  CT ABDOMEN AND PELVIS  Findings:  No suspicious liver abnormality.  The patient is status post cholecystectomy.  No biliary dilatation.  Normal appearance of the pancreas.  The spleen is normal.  The adrenal glands are both unremarkable.  Normal  appearance of the kidneys.  Urinary bladder appears within normal limits.  The prostate gland and seminal vesicles are negative.  Calcified atherosclerotic disease affects the abdominal aorta.  There is no aneurysm. Prominent upper abdominal lymph nodes are identified. Gastrohepatic ligament lymph node measures 1 cm, image 64/series 2. Previously this measured 1 cm.  No pelvic or inguinal adenopathy noted.  No free fluid or abnormal fluid collections identified.  The stomach appears within normal limits.  The small bowel loops have a normal course and caliber without obstruction.  There has been interval resolution of previous right lower quadrant abscess. The colon is within normal limits.  No obstructing mass identified.  There has been resolution  of previous upper abdominal ascites.  A few small peritoneal nodules are identified on today's exam.  Index nodules in the region of the falciform ligament are identified, image 78/series 2.  Review of the visualized osseous structures demonstrates no aggressive lytic or sclerotic bone lesions.  IMPRESSION:  1.  Resolution of previous right lower quadrant abscess. 2.  Similar appearance of prominent upper abdominal lymph nodes. 3.  A few small peritoneal nodules are identified in the region of the falciform ligament.  Since the previous exam there has been resolution of abdominal and pelvic ascites.   Original Report Authenticated By: Signa Kell, M.D.    Medications: I have reviewed the patient's current medications.  Assessment/Plan:  1. 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 CT of the chest 11/18/2012 with borderline enlarged mediastinal nodes, gastrohepatic ligament nodes and omental/peritoneal nodularity. Negative upper endoscopy. Normal CEA on 12/13/2012. Status post cycle 1 FOLFOX 12/11/2012. Status post cycle 2 FOLFOX 12/25/2012. Status post cycle 3 on 01/15/2013. Cycle 4 on 01/29/2013; cycle 5 on 02/12/2013; cycle 6 on 02/26/2013. 2. Cecal/appendix mass noted at the time of exploratory laparotomy 11/13/2012. 3. COPD. 4. Pain secondary to carcinomatosis. 5. Chronic back pain. Stable.  6. Status post Port-A-Cath placement 12/02/2012. 7. Delayed nausea following cycle 1 FOLFOX. Aloxi was added with cycle 2. He had delayed nausea following cycle 2. Emend was added beginning with cycle 3.  8. Diarrhea following cycle 2 FOLFOX. Question related to 5-fluorouracil. The diarrhea improved with Lomotil. The 5-fluorouracil bolus was eliminated and the dose of the 5 fluorouracil infusion was reduced beginning with cycle 3.  9. Left mid back  pain/tenderness just lateral to the spine. Question etiology. Duragesic patch increased to 75 mcg every 3 days. He takes oxycodone as needed. He continues to have back pain. 10. Anorexia/weight loss. He began a trial of Megace following office visit 01/08/2013. Appetite is better. He has discontinued Megace. 11. CA 19-9 elevated at 1139 on 12/25/2012. Improved on 01/29/2013. Further improved on 02/26/2013.  Disposition-Frank Baird appears stable. He has completed 6 cycles of FOLFOX chemotherapy. The CA 19-9 is now in normal range. Recent restaging CT evaluation was overall stable with some areas of improvement. Dr. Truett Perna recommends proceeding with cycle 7 FOLFOX today as scheduled.  Frank Baird was recently evaluated by Dr. Lenis Noon. He has decided against proceeding with cytoreductive surgery and HIPEC.  Frank Baird will return for a followup visit and cycle 8 FOLFOX in 2 weeks. He will contact the office in the interim with any problems.  Patient seen with Dr. Truett Perna.  Lonna Cobb ANP/GNP-BC   This was a shared visit with Lonna Cobb. We discussed the restaging CT findings with Frank Baird. There is no clear evidence  of disease progression and the ascites has improved. The CEA is lower. He appears to be responding to FOLFOX.  Frank Baird indicated he has decided against HIPEC. I recommend continuing FOLFOX for several more cycles. We will then consider a switch to maintenance capecitabine.  The back pain may be related to chronic degenerative change or distant disease. I will review the 03/07/2013 CTs in radiology with attention to the thoracic spine. We will check an MRI of the spine if the back pain progresses.  Mancel Bale, M.D.

## 2013-03-12 NOTE — Telephone Encounter (Signed)
Per staff message and POF I have scheduled appts.  JMW  

## 2013-03-12 NOTE — Progress Notes (Signed)
Patient reports he continues to struggle with nausea.  His medication helps but does not completely eliminate nausea.  He continues to try to consume boost or Glucerna at least once daily.  He is eating yogurt as tolerated one to 2 times daily.  His weight continues to trend down and was documented as 187.8 pounds September 10.  This is down from 191.9 pounds August 27.  Nutrition diagnosis: Unintended weight loss continues.  Intervention: Patient was encouraged to continue higher calorie, higher protein foods as tolerated with oral nutrition supplements between meals.  Recommended patient discuss nausea with physician to see if he needs additional medications.  Monitoring, evaluation, goals: Patient will tolerate increased oral intake with oral nutrition supplements to minimize further weight loss.  Next visit: Wednesday, September 24 in the chemotherapy room.

## 2013-03-13 ENCOUNTER — Other Ambulatory Visit: Payer: Self-pay | Admitting: Family Medicine

## 2013-03-13 NOTE — Telephone Encounter (Signed)
Meds refilled.

## 2013-03-14 ENCOUNTER — Ambulatory Visit (HOSPITAL_BASED_OUTPATIENT_CLINIC_OR_DEPARTMENT_OTHER): Payer: Federal, State, Local not specified - PPO

## 2013-03-14 VITALS — BP 118/61 | HR 57 | Temp 98.6°F

## 2013-03-14 DIAGNOSIS — C801 Malignant (primary) neoplasm, unspecified: Secondary | ICD-10-CM

## 2013-03-14 MED ORDER — SODIUM CHLORIDE 0.9 % IJ SOLN
10.0000 mL | INTRAMUSCULAR | Status: DC | PRN
  Administered 2013-03-14: 10 mL
  Filled 2013-03-14: qty 10

## 2013-03-14 MED ORDER — HEPARIN SOD (PORK) LOCK FLUSH 100 UNIT/ML IV SOLN
500.0000 [IU] | Freq: Once | INTRAVENOUS | Status: AC | PRN
  Administered 2013-03-14: 500 [IU]
  Filled 2013-03-14: qty 5

## 2013-03-16 NOTE — Progress Notes (Signed)
I reviewed the 03/07/13 CT in radiology.  The ascites noted in May 2014 and inflammation near appendix/cecum have resolved. Stable peritoneal nodules.  No evidence of spine metastases.

## 2013-03-17 ENCOUNTER — Telehealth: Payer: Self-pay | Admitting: Oncology

## 2013-03-17 NOTE — Telephone Encounter (Signed)
Pt aware of appt on 9/24 lab,md and chemo,, he will come by get calendar for September and October 2014 °

## 2013-03-23 ENCOUNTER — Other Ambulatory Visit: Payer: Self-pay | Admitting: Oncology

## 2013-03-26 ENCOUNTER — Ambulatory Visit (HOSPITAL_BASED_OUTPATIENT_CLINIC_OR_DEPARTMENT_OTHER): Payer: Federal, State, Local not specified - PPO

## 2013-03-26 ENCOUNTER — Other Ambulatory Visit (HOSPITAL_BASED_OUTPATIENT_CLINIC_OR_DEPARTMENT_OTHER): Payer: Federal, State, Local not specified - PPO | Admitting: Lab

## 2013-03-26 ENCOUNTER — Ambulatory Visit (HOSPITAL_BASED_OUTPATIENT_CLINIC_OR_DEPARTMENT_OTHER): Payer: Federal, State, Local not specified - PPO | Admitting: Oncology

## 2013-03-26 ENCOUNTER — Telehealth: Payer: Self-pay | Admitting: Oncology

## 2013-03-26 ENCOUNTER — Ambulatory Visit: Payer: Federal, State, Local not specified - PPO | Admitting: Nutrition

## 2013-03-26 ENCOUNTER — Other Ambulatory Visit: Payer: Self-pay | Admitting: *Deleted

## 2013-03-26 ENCOUNTER — Telehealth: Payer: Self-pay | Admitting: *Deleted

## 2013-03-26 VITALS — BP 122/66 | HR 58 | Temp 96.2°F | Resp 18 | Ht 69.0 in | Wt 188.5 lb

## 2013-03-26 DIAGNOSIS — C762 Malignant neoplasm of abdomen: Secondary | ICD-10-CM

## 2013-03-26 DIAGNOSIS — C772 Secondary and unspecified malignant neoplasm of intra-abdominal lymph nodes: Secondary | ICD-10-CM

## 2013-03-26 DIAGNOSIS — G620 Drug-induced polyneuropathy: Secondary | ICD-10-CM

## 2013-03-26 DIAGNOSIS — G893 Neoplasm related pain (acute) (chronic): Secondary | ICD-10-CM

## 2013-03-26 DIAGNOSIS — C801 Malignant (primary) neoplasm, unspecified: Secondary | ICD-10-CM

## 2013-03-26 DIAGNOSIS — M549 Dorsalgia, unspecified: Secondary | ICD-10-CM

## 2013-03-26 DIAGNOSIS — Z5111 Encounter for antineoplastic chemotherapy: Secondary | ICD-10-CM

## 2013-03-26 LAB — CBC WITH DIFFERENTIAL/PLATELET
BASO%: 0.4 % (ref 0.0–2.0)
Eosinophils Absolute: 0.1 10*3/uL (ref 0.0–0.5)
MCV: 90.8 fL (ref 79.3–98.0)
MONO%: 8.8 % (ref 0.0–14.0)
NEUT#: 5 10*3/uL (ref 1.5–6.5)
RBC: 4.67 10*6/uL (ref 4.20–5.82)
RDW: 15.6 % — ABNORMAL HIGH (ref 11.0–14.6)
WBC: 7.3 10*3/uL (ref 4.0–10.3)

## 2013-03-26 LAB — COMPREHENSIVE METABOLIC PANEL (CC13)
ALT: 14 U/L (ref 0–55)
AST: 23 U/L (ref 5–34)
Albumin: 3.3 g/dL — ABNORMAL LOW (ref 3.5–5.0)
Alkaline Phosphatase: 61 U/L (ref 40–150)
Glucose: 160 mg/dl — ABNORMAL HIGH (ref 70–140)
Potassium: 4.4 mEq/L (ref 3.5–5.1)
Sodium: 138 mEq/L (ref 136–145)
Total Protein: 7.2 g/dL (ref 6.4–8.3)

## 2013-03-26 MED ORDER — DEXTROSE 5 % IV SOLN
Freq: Once | INTRAVENOUS | Status: AC
  Administered 2013-03-26: 11:00:00 via INTRAVENOUS

## 2013-03-26 MED ORDER — OXYCODONE HCL 10 MG PO TABS: 10.0000 mg | ORAL_TABLET | ORAL | Status: DC | PRN

## 2013-03-26 MED ORDER — DEXAMETHASONE 4 MG PO TABS: ORAL_TABLET | ORAL | Status: DC

## 2013-03-26 MED ORDER — SODIUM CHLORIDE 0.9 % IV SOLN
1920.0000 mg/m2 | INTRAVENOUS | Status: DC
  Administered 2013-03-26: 4050 mg via INTRAVENOUS
  Filled 2013-03-26: qty 81

## 2013-03-26 MED ORDER — OXALIPLATIN CHEMO INJECTION 100 MG/20ML
85.0000 mg/m2 | Freq: Once | INTRAVENOUS | Status: AC
  Administered 2013-03-26: 180 mg via INTRAVENOUS
  Filled 2013-03-26: qty 36

## 2013-03-26 MED ORDER — FOSAPREPITANT DIMEGLUMINE INJECTION 150 MG
150.0000 mg | Freq: Once | INTRAVENOUS | Status: AC
  Administered 2013-03-26: 150 mg via INTRAVENOUS
  Filled 2013-03-26: qty 5

## 2013-03-26 MED ORDER — PALONOSETRON HCL INJECTION 0.25 MG/5ML
INTRAVENOUS | Status: AC
  Filled 2013-03-26: qty 5

## 2013-03-26 MED ORDER — DEXAMETHASONE SODIUM PHOSPHATE 10 MG/ML IJ SOLN
INTRAMUSCULAR | Status: AC
  Filled 2013-03-26: qty 1

## 2013-03-26 MED ORDER — PALONOSETRON HCL INJECTION 0.25 MG/5ML
0.2500 mg | Freq: Once | INTRAVENOUS | Status: AC
  Administered 2013-03-26: 0.25 mg via INTRAVENOUS

## 2013-03-26 MED ORDER — DEXAMETHASONE SODIUM PHOSPHATE 10 MG/ML IJ SOLN
10.0000 mg | Freq: Once | INTRAMUSCULAR | Status: AC
  Administered 2013-03-26: 10 mg via INTRAVENOUS

## 2013-03-26 MED ORDER — LEUCOVORIN CALCIUM INJECTION 350 MG
401.0000 mg/m2 | Freq: Once | INTRAVENOUS | Status: AC
  Administered 2013-03-26: 850 mg via INTRAVENOUS
  Filled 2013-03-26: qty 42.5

## 2013-03-26 NOTE — Telephone Encounter (Signed)
gv and printed appt sched and avs for pt....mw added tx. °

## 2013-03-26 NOTE — Patient Instructions (Signed)
[facility] Discharge Instructions for Patients Receiving Chemotherapy  Today you received the following chemotherapy agents Oxaliplatin, Leucovorin, and 5 FU. To help prevent nausea and vomiting after your treatment, we encourage you to take your nausea medication Compazine every 6 hours or Zofran every 8 hours as needed for nausea  If you develop nausea and vomiting that is not controlled by your nausea medication, call the clinic.   BELOW ARE SYMPTOMS THAT SHOULD BE REPORTED IMMEDIATELY:  *FEVER GREATER THAN 100.5 F  *CHILLS WITH OR WITHOUT FEVER  NAUSEA AND VOMITING THAT IS NOT CONTROLLED WITH YOUR NAUSEA MEDICATION  *UNUSUAL SHORTNESS OF BREATH  *UNUSUAL BRUISING OR BLEEDING  TENDERNESS IN MOUTH AND THROAT WITH OR WITHOUT PRESENCE OF ULCERS  *URINARY PROBLEMS  *BOWEL PROBLEMS  UNUSUAL RASH Items with * indicate a potential emergency and should be followed up as soon as possible.  Feel free to call the clinic you have any questions or concerns. The clinic phone number is [phone].

## 2013-03-26 NOTE — Progress Notes (Signed)
   [facility]    OFFICE PROGRESS NOTE   INTERVAL HISTORY:   [name] returns as scheduled. He completed another cycle of FOLFOX on 03/12/2013. He reports nausea beginning on day 4. He has noted mild numbness in the fingertips. This does not interfere with activity. He continues to have back pain. The Duragesic patch last for only 2-1/2 days.  Objective:  Vital signs in last 24 hours:  Blood pressure 122/66, pulse 58, temperature 96.2 F (35.7 C), temperature source Oral, resp. rate 18, height 5\' 9"  (1.753 m), weight 188 lb 8 oz (85.503 kg).    HEENT: No thrush or ulcers Resp: Lungs clear bilaterally Cardio: Regular rate and rhythm GI: Tender in the right upper and lower abdomen, no hepatomegaly, no mass Vascular: No leg edema Neuro: Mild loss of vibratory sense at the fingertips bilaterally    Portacath/PICC-without erythema  Lab Results:  Lab Results  Component Value Date   WBC 7.3 03/26/2013   HGB 14.5 03/26/2013   HCT 42.4 03/26/2013   MCV 90.8 03/26/2013   PLT 122* 03/26/2013   ANC 5.0    Medications: I have reviewed the patient's current medications.  Assessment/Plan: 1. 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 CT of the chest 11/18/2012 with borderline enlarged mediastinal nodes, gastrohepatic ligament nodes and omental/peritoneal nodularity. Negative upper endoscopy. Normal CEA on 12/13/2012. Status post cycle 1 FOLFOX 12/11/2012. Status post cycle 2 FOLFOX 12/25/2012. Status post cycle 3 on 01/15/2013. Cycle 4 on 01/29/2013; cycle 5 on 02/12/2013; cycle 6 on 02/26/2013. 2. Cecal/appendix mass noted at the time of exploratory laparotomy 11/13/2012. 3. COPD. 4. Pain secondary to carcinomatosis. 5. Chronic back pain. Stable.  6. Status post Port-A-Cath placement 12/02/2012. 7. Delayed nausea  following cycle 1 FOLFOX. Aloxi was added with cycle 2. He had delayed nausea following cycle 2. Emend was added beginning with cycle 3.  8. Diarrhea following cycle 2 FOLFOX. Question related to 5-fluorouracil. The diarrhea improved with Lomotil. The 5-fluorouracil bolus was eliminated and the dose of the 5 fluorouracil infusion was reduced beginning with cycle 3.  9. Left mid back pain/tenderness just lateral to the spine. Question etiology. Duragesic patch increased to 75 mcg every 3 days. He takes oxycodone as needed. He continues to have back pain. 10. Anorexia/weight loss. He began a trial of Megace following office visit 01/08/2013. Appetite is better.  11. CA 19-9 elevated at 1139 on 12/25/2012. Improved on 01/29/2013. Further improved on 02/26/2013 and 03/12/2013. 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 The serum tumor marker and CT are improved. The plan is to complete at least one additional cycle of FOLFOX. We will then switch to maintenance 5-FU pending the neuropathy status when he returns in 2 weeks.  [name] had delayed nausea following the most recent cycle of chemotherapy. We will add prophylactic Decadron with this cycle. He will monitor the blood sugar while on Decadron and contact us for a blood sugar of greater than 400.   Thornton Papas, MD  03/26/2013  10:11 AM

## 2013-03-26 NOTE — Telephone Encounter (Signed)
Per staff message and POF I have scheduled appts.  JMW  

## 2013-03-26 NOTE — Progress Notes (Signed)
Patient reports increased nausea this past treatment.  He continues to consume boost or Glucerna and eat yogurt when he is nauseated.  His weight is stable at 188.5 pounds documented September 24, from 187.8 pounds September 10.  Patient reports he realizes he is eating less.  Nutrition diagnosis: Unintended weight loss improved.  Intervention: I educated patient on strategies for eating with nausea.  I've encouraged small amounts of low-fat, low fiber foods throughout the day.  I have recommended patient to consume liquids between meals and snacks.  He is to take nausea medication as prescribed.  Recommended patient try Ensure Plus or boost plus during times when oral intake has decreased significantly.  Teach back method used.  Questions were answered..  Monitoring, evaluation, goals: Patient will tolerate adequate calories and protein to minimize weight loss.  Next visit: Wednesday, October 8, during chemotherapy.

## 2013-03-28 ENCOUNTER — Ambulatory Visit (HOSPITAL_BASED_OUTPATIENT_CLINIC_OR_DEPARTMENT_OTHER): Payer: Federal, State, Local not specified - PPO

## 2013-03-28 VITALS — BP 133/67 | HR 52 | Temp 99.1°F

## 2013-03-28 DIAGNOSIS — C786 Secondary malignant neoplasm of retroperitoneum and peritoneum: Secondary | ICD-10-CM

## 2013-03-28 DIAGNOSIS — C801 Malignant (primary) neoplasm, unspecified: Secondary | ICD-10-CM

## 2013-03-28 MED ORDER — SODIUM CHLORIDE 0.9 % IJ SOLN
10.0000 mL | INTRAMUSCULAR | Status: DC | PRN
  Administered 2013-03-28: 10 mL
  Filled 2013-03-28: qty 10

## 2013-03-28 MED ORDER — HEPARIN SOD (PORK) LOCK FLUSH 100 UNIT/ML IV SOLN
500.0000 [IU] | Freq: Once | INTRAVENOUS | Status: AC | PRN
  Administered 2013-03-28: 500 [IU]
  Filled 2013-03-28: qty 5

## 2013-04-06 ENCOUNTER — Other Ambulatory Visit: Payer: Self-pay | Admitting: Oncology

## 2013-04-09 ENCOUNTER — Other Ambulatory Visit: Payer: Self-pay | Admitting: *Deleted

## 2013-04-09 ENCOUNTER — Ambulatory Visit (HOSPITAL_BASED_OUTPATIENT_CLINIC_OR_DEPARTMENT_OTHER): Payer: Federal, State, Local not specified - PPO | Admitting: Nurse Practitioner

## 2013-04-09 ENCOUNTER — Other Ambulatory Visit (HOSPITAL_BASED_OUTPATIENT_CLINIC_OR_DEPARTMENT_OTHER): Payer: Federal, State, Local not specified - PPO

## 2013-04-09 ENCOUNTER — Encounter (INDEPENDENT_AMBULATORY_CARE_PROVIDER_SITE_OTHER): Payer: Self-pay

## 2013-04-09 ENCOUNTER — Telehealth: Payer: Self-pay | Admitting: Oncology

## 2013-04-09 ENCOUNTER — Encounter: Payer: Federal, State, Local not specified - PPO | Admitting: Nutrition

## 2013-04-09 ENCOUNTER — Inpatient Hospital Stay: Payer: Federal, State, Local not specified - PPO

## 2013-04-09 VITALS — BP 128/71 | HR 52 | Temp 97.8°F | Resp 20 | Ht 69.0 in | Wt 185.8 lb

## 2013-04-09 DIAGNOSIS — M549 Dorsalgia, unspecified: Secondary | ICD-10-CM

## 2013-04-09 DIAGNOSIS — C762 Malignant neoplasm of abdomen: Secondary | ICD-10-CM

## 2013-04-09 DIAGNOSIS — G893 Neoplasm related pain (acute) (chronic): Secondary | ICD-10-CM

## 2013-04-09 DIAGNOSIS — C786 Secondary malignant neoplasm of retroperitoneum and peritoneum: Secondary | ICD-10-CM

## 2013-04-09 DIAGNOSIS — C801 Malignant (primary) neoplasm, unspecified: Secondary | ICD-10-CM

## 2013-04-09 DIAGNOSIS — G620 Drug-induced polyneuropathy: Secondary | ICD-10-CM

## 2013-04-09 LAB — CBC WITH DIFFERENTIAL/PLATELET
EOS%: 2.4 % (ref 0.0–7.0)
LYMPH%: 33.3 % (ref 14.0–49.0)
MCH: 31 pg (ref 27.2–33.4)
MCHC: 34 g/dL (ref 32.0–36.0)
MCV: 91.1 fL (ref 79.3–98.0)
MONO%: 10.9 % (ref 0.0–14.0)
RBC: 4.71 10*6/uL (ref 4.20–5.82)
RDW: 15.5 % — ABNORMAL HIGH (ref 11.0–14.6)
nRBC: 0 % (ref 0–0)

## 2013-04-09 MED ORDER — FENTANYL 75 MCG/HR TD PT72: 1.0000 | MEDICATED_PATCH | TRANSDERMAL | Status: DC

## 2013-04-09 NOTE — Progress Notes (Addendum)
OFFICE PROGRESS NOTE  Interval history:  Frank Baird returns as scheduled. He completed cycle 8 FOLFOX on 03/26/2013. He reports having nausea that has persisted. He took 1 dose of the prophylactic dexamethasone. He did not feel that it helped so he did not take the remainder of the doses. He is having diarrhea "off and on". He denies any mouth sores. He reports the neuropathy symptoms in the fingers and toes have increased and are present constantly. He notes that he is dropping things. The cold sensitivity lasted 10-12 days. Appetite is decreased. He is able to eat later in the day. No skin rash. Pain overall is well controlled as long as he takes his medications on schedule. He denies shortness of breath.   Objective: Blood pressure 128/71, pulse 52, temperature 97.8 F (36.6 C), temperature source Oral, resp. rate 20, height 5\' 9"  (1.753 m), weight 185 lb 12.8 oz (84.278 kg).  Oropharynx is without thrush or ulceration. Lungs are clear. No wheezes or rales. Regular cardiac rhythm. Port-A-Cath site is without erythema. Abdomen is soft. Tender at the right upper and lower abdomen. Question palpable liver edge. No mass. Extremities without edema. Moderate decrease in vibratory sense over the fingertips bilaterally per tuning fork exam.  Lab Results: Lab Results  Component Value Date   WBC 5.3 04/09/2013   HGB 14.6 04/09/2013   HCT 42.9 04/09/2013   MCV 91.1 04/09/2013   PLT 121* 04/09/2013    Chemistry:    Chemistry      Component Value Date/Time   NA 138 03/26/2013 0908   NA 129* 11/17/2012 0446   K 4.4 03/26/2013 0908   K 3.9 11/17/2012 0446   CL 98 12/25/2012 0903   CL 97 11/17/2012 0446   CO2 27 03/26/2013 0908   CO2 26 11/17/2012 0446   BUN 5.0* 03/26/2013 0908   BUN 6 11/17/2012 0446   CREATININE 0.8 03/26/2013 0908   CREATININE 0.81 11/17/2012 0446   CREATININE 0.72 11/12/2012 1430      Component Value Date/Time   CALCIUM 9.4 03/26/2013 0908   CALCIUM 8.7 11/17/2012 0446   ALKPHOS 61  03/26/2013 0908   ALKPHOS 124* 11/12/2012 1850   AST 23 03/26/2013 0908   AST 21 11/12/2012 1850   ALT 14 03/26/2013 0908   ALT 47 11/12/2012 1850   BILITOT 0.34 03/26/2013 0908   BILITOT 0.7 11/12/2012 1850       Studies/Results: No results found.  Medications: I have reviewed the patient's current medications.  Assessment/Plan:  1. 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 CT of the chest 11/18/2012 with borderline enlarged mediastinal nodes, gastrohepatic ligament nodes and omental/peritoneal nodularity. Negative upper endoscopy. Normal CEA on 12/13/2012. Status post cycle 1 FOLFOX 12/11/2012. Status post cycle 2 FOLFOX 12/25/2012. Status post cycle 3 on 01/15/2013. Cycle 4 on 01/29/2013; cycle 5 on 02/12/2013; cycle 6 on 02/26/2013. Restaging CT evaluation showed improvement. He completed cycle 8 on 03/26/2013. 2. Cecal/appendix mass noted at the time of exploratory laparotomy 11/13/2012. 3. COPD. 4. Pain secondary to carcinomatosis. 5. Chronic back pain. Stable.  6. Status post Port-A-Cath placement 12/02/2012. 7. Delayed nausea following cycle 1 FOLFOX. Aloxi was added with cycle 2. He had delayed nausea following cycle 2. Emend was added beginning with cycle 3. Prophylactic Decadron added beginning with cycle 8. He did not take as prescribed. He again had delayed nausea. 8. Diarrhea following cycle 2 FOLFOX. Question related to  5-fluorouracil. The diarrhea improved with Lomotil. The 5-fluorouracil bolus was eliminated and the dose of the 5 fluorouracil infusion was reduced beginning with cycle 3.  9. Left mid back pain/tenderness just lateral to the spine. Question etiology. Duragesic patch increased to 75 mcg every 3 days. He takes oxycodone as needed. He continues to have back pain. 10. Anorexia/weight loss. He began a trial of Megace  following office visit 01/08/2013. Appetite is better.  11. CA 19-9 elevated at 1139 on 12/25/2012. Improved on 01/29/2013. Further improved on 02/26/2013 and 03/12/2013. 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 Increased with moderate loss of vibratory sense at the fingertips per tuning fork exam and now interfering with activity.  Disposition-Mr. Cloe has completed 8 cycles of oxaliplatin. The CA 19-9 tumor marker has improved and the restaging CT evaluation after 6 cycles also showed improvement.  He has progressive neuropathy symptoms. Dr. Truett Perna recommends discontinuation of oxaliplatin with continuation of maintenance 5-fluorouracil. We discussed continuing the 5-fluorouracil as per the FOLFOX regimen versus Xeloda. He would like to try Xeloda initially. We reviewed potential toxicities including myelosuppression, nausea, mouth sores, diarrhea, hand-foot syndrome, skin rash, skin hyperpigmentation, conjunctivitis like symptoms. He was given written information on Xeloda as well.  We anticipate he will begin cycle 1 around 04/16/2013. We will see him in followup on 05/05/2013. He will contact the office in the interim with any problems.  Patient was seen with Dr. Truett Perna.  Lonna Cobb ANP/GNP-BC   30 minutes of today's visit was spent face-to-face with 50% of that time involved in counseling/coordination of care.  This was a shared visit with Lonna Cobb. He has completed 8 cycles of FOLFOX. He has developed neuropathy symptoms. We decided to switch to "maintenance" Xeloda. We reviewed the potential toxicities associated with Xeloda. The plan is to begin the first cycle of Xeloda within the next one week.  Mancel Bale, M.D.

## 2013-04-09 NOTE — Telephone Encounter (Signed)
Gave pt appt for lab,ML and Flush for November 2014

## 2013-04-11 ENCOUNTER — Encounter: Payer: Self-pay | Admitting: Oncology

## 2013-04-11 ENCOUNTER — Other Ambulatory Visit: Payer: Self-pay | Admitting: *Deleted

## 2013-04-11 MED ORDER — CAPECITABINE 500 MG PO TABS: 1500.0000 mg | ORAL_TABLET | Freq: Two times a day (BID) | ORAL | Status: DC

## 2013-04-11 NOTE — Progress Notes (Signed)
Faxed xeloda prescription to [facility]. °

## 2013-04-18 ENCOUNTER — Other Ambulatory Visit: Payer: Self-pay | Admitting: Family Medicine

## 2013-04-22 ENCOUNTER — Other Ambulatory Visit: Payer: Self-pay | Admitting: *Deleted

## 2013-04-22 DIAGNOSIS — C801 Malignant (primary) neoplasm, unspecified: Secondary | ICD-10-CM

## 2013-04-22 DIAGNOSIS — C762 Malignant neoplasm of abdomen: Secondary | ICD-10-CM

## 2013-04-22 MED ORDER — LORAZEPAM 1 MG PO TABS: 1.0000 mg | ORAL_TABLET | Freq: Four times a day (QID) | ORAL | Status: DC | PRN

## 2013-04-22 MED ORDER — OXYCODONE HCL 10 MG PO TABS: 10.0000 mg | ORAL_TABLET | ORAL | Status: DC | PRN

## 2013-04-22 NOTE — Telephone Encounter (Signed)
Call from pt requesting Rx refill for Lorazepam and Oxycodone. Reviewed with Dr. Truett Perna, order received. Pt instructed to call pharmacy for Compazine refill. He voiced understanding.

## 2013-04-23 ENCOUNTER — Telehealth: Payer: Self-pay | Admitting: *Deleted

## 2013-04-23 ENCOUNTER — Ambulatory Visit: Payer: Federal, State, Local not specified - PPO | Admitting: Nurse Practitioner

## 2013-04-23 ENCOUNTER — Telehealth: Payer: Self-pay | Admitting: Oncology

## 2013-04-23 ENCOUNTER — Encounter: Payer: Federal, State, Local not specified - PPO | Admitting: Nutrition

## 2013-04-23 ENCOUNTER — Other Ambulatory Visit: Payer: Federal, State, Local not specified - PPO | Admitting: Lab

## 2013-04-23 ENCOUNTER — Inpatient Hospital Stay: Payer: Federal, State, Local not specified - PPO

## 2013-04-23 NOTE — Telephone Encounter (Signed)
Message from [facility] pharmacy, future Rx should go there. Phone- [phone] ext. [phone]  Fax- [phone].  Spoke with Arlys John at [facility] pharmacy, contacted patient and he did not pick up Xeloda.   Called patient, reported he would pick up medication today.  Instructed patient to start meds right away.  Pt voiced understanding.

## 2013-04-23 NOTE — Telephone Encounter (Signed)
talked to pt and gave him appt for lab and MD

## 2013-05-05 ENCOUNTER — Ambulatory Visit: Payer: Federal, State, Local not specified - PPO | Admitting: Nurse Practitioner

## 2013-05-05 ENCOUNTER — Other Ambulatory Visit: Payer: Federal, State, Local not specified - PPO | Admitting: Lab

## 2013-05-12 ENCOUNTER — Other Ambulatory Visit (HOSPITAL_BASED_OUTPATIENT_CLINIC_OR_DEPARTMENT_OTHER): Payer: Federal, State, Local not specified - PPO | Admitting: Lab

## 2013-05-12 ENCOUNTER — Other Ambulatory Visit: Payer: Self-pay | Admitting: *Deleted

## 2013-05-12 ENCOUNTER — Encounter (INDEPENDENT_AMBULATORY_CARE_PROVIDER_SITE_OTHER): Payer: Self-pay

## 2013-05-12 ENCOUNTER — Ambulatory Visit (HOSPITAL_BASED_OUTPATIENT_CLINIC_OR_DEPARTMENT_OTHER): Payer: Federal, State, Local not specified - PPO | Admitting: Nurse Practitioner

## 2013-05-12 ENCOUNTER — Telehealth: Payer: Self-pay | Admitting: Oncology

## 2013-05-12 ENCOUNTER — Ambulatory Visit (HOSPITAL_BASED_OUTPATIENT_CLINIC_OR_DEPARTMENT_OTHER): Payer: Federal, State, Local not specified - PPO

## 2013-05-12 VITALS — BP 127/68 | HR 54 | Temp 99.0°F | Resp 18 | Ht 69.0 in | Wt 184.2 lb

## 2013-05-12 DIAGNOSIS — C801 Malignant (primary) neoplasm, unspecified: Secondary | ICD-10-CM

## 2013-05-12 DIAGNOSIS — M545 Low back pain: Secondary | ICD-10-CM

## 2013-05-12 DIAGNOSIS — C762 Malignant neoplasm of abdomen: Secondary | ICD-10-CM

## 2013-05-12 DIAGNOSIS — G620 Drug-induced polyneuropathy: Secondary | ICD-10-CM

## 2013-05-12 DIAGNOSIS — C786 Secondary malignant neoplasm of retroperitoneum and peritoneum: Secondary | ICD-10-CM

## 2013-05-12 DIAGNOSIS — G893 Neoplasm related pain (acute) (chronic): Secondary | ICD-10-CM

## 2013-05-12 LAB — CANCER ANTIGEN 19-9: CA 19-9: 20.1 U/mL (ref <35.0)

## 2013-05-12 LAB — COMPREHENSIVE METABOLIC PANEL (CC13)
ALT: 15 U/L (ref 0–55)
AST: 18 U/L (ref 5–34)
Albumin: 3.9 g/dL (ref 3.5–5.0)
Alkaline Phosphatase: 65 U/L (ref 40–150)
Calcium: 9.9 mg/dL (ref 8.4–10.4)
Chloride: 103 mEq/L (ref 98–109)
Creatinine: 0.9 mg/dL (ref 0.7–1.3)
Potassium: 4.1 mEq/L (ref 3.5–5.1)
Total Bilirubin: 0.64 mg/dL (ref 0.20–1.20)

## 2013-05-12 LAB — CBC WITH DIFFERENTIAL/PLATELET
BASO%: 1 % (ref 0.0–2.0)
EOS%: 1.9 % (ref 0.0–7.0)
LYMPH%: 32.8 % (ref 14.0–49.0)
MCH: 31.2 pg (ref 27.2–33.4)
MCHC: 33.8 g/dL (ref 32.0–36.0)
NEUT#: 4.1 10*3/uL (ref 1.5–6.5)
RDW: 15.6 % — ABNORMAL HIGH (ref 11.0–14.6)
lymph#: 2.4 10*3/uL (ref 0.9–3.3)

## 2013-05-12 MED ORDER — OXYCODONE HCL 10 MG PO TABS: 10.0000 mg | ORAL_TABLET | ORAL | Status: DC | PRN

## 2013-05-12 MED ORDER — SODIUM CHLORIDE 0.9 % IJ SOLN
10.0000 mL | INTRAMUSCULAR | Status: DC | PRN
  Administered 2013-05-12: 10 mL via INTRAVENOUS
  Filled 2013-05-12: qty 10

## 2013-05-12 MED ORDER — FENTANYL 75 MCG/HR TD PT72: 75.0000 ug | MEDICATED_PATCH | TRANSDERMAL | Status: DC

## 2013-05-12 MED ORDER — HEPARIN SOD (PORK) LOCK FLUSH 100 UNIT/ML IV SOLN
500.0000 [IU] | Freq: Once | INTRAVENOUS | Status: AC
  Administered 2013-05-12: 500 [IU] via INTRAVENOUS
  Filled 2013-05-12: qty 5

## 2013-05-12 MED ORDER — CAPECITABINE 500 MG PO TABS: 1500.0000 mg | ORAL_TABLET | Freq: Two times a day (BID) | ORAL | Status: DC

## 2013-05-12 NOTE — Telephone Encounter (Signed)
gv and printed appt sched and avs for pt for DEc °

## 2013-05-12 NOTE — Progress Notes (Signed)
OFFICE PROGRESS NOTE  Interval history:  [name] returns for followup of metastatic adenocarcinoma with abdominal carcinomatosis. He completed cycle 1 Xeloda beginning 04/24/2013. He developed nausea around day 3. He did not take the final 2 days of the cycle due to nausea/vomiting. No change in baseline intermittent loose stools. No redness or pain on his hands or feet. He reports persistent cold sensitivity, numbness/tingling in the fingertips. Eyes have been "watery". Appetite is beginning to improve. Stable chronic back pain. Some abdominal pain. He continues for a Duragesic patch with oxycodone as needed. He takes the oxycodone for back pain mainly.   Objective: Blood pressure 127/68, pulse 54, temperature 99 F (37.2 C), temperature source Oral, resp. rate 18, height 5\' 9"  (1.753 m), weight 184 lb 3.2 oz (83.553 kg), SpO2 97.00%.  No thrush or ulcerations. Lungs are clear. Regular cardiac rhythm. Port-A-Cath site without erythema. Abdomen soft with mild tenderness at the right upper quadrant. No hepatomegaly. No mass. Extremities without edema. Mild to moderate decrease in vibratory sense over the fingertips per tuning fork exam.  Lab Results: Lab Results  Component Value Date   WBC 7.3 05/12/2013   HGB 14.1 05/12/2013   HCT 41.8 05/12/2013   MCV 92.3 05/12/2013   PLT 142 05/12/2013    Chemistry:    Chemistry      Component Value Date/Time   NA 135* 05/12/2013 1401   NA 129* 11/17/2012 0446   K 4.1 05/12/2013 1401   K 3.9 11/17/2012 0446   CL 98 12/25/2012 0903   CL 97 11/17/2012 0446   CO2 22 05/12/2013 1401   CO2 26 11/17/2012 0446   BUN 10.9 05/12/2013 1401   BUN 6 11/17/2012 0446   CREATININE 0.9 05/12/2013 1401   CREATININE 0.81 11/17/2012 0446   CREATININE 0.72 11/12/2012 1430      Component Value Date/Time   CALCIUM 9.9 05/12/2013 1401   CALCIUM 8.7 11/17/2012 0446   ALKPHOS 65 05/12/2013 1401   ALKPHOS 124* 11/12/2012 1850   AST 18 05/12/2013 1401   AST 21  11/12/2012 1850   ALT 15 05/12/2013 1401   ALT 47 11/12/2012 1850   BILITOT 0.64 05/12/2013 1401   BILITOT 0.7 11/12/2012 1850       Studies/Results: No results found.  Medications: I have reviewed the patient's current medications.  Assessment/Plan:  1. 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 CT of the chest 11/18/2012 with borderline enlarged mediastinal nodes, gastrohepatic ligament nodes and omental/peritoneal nodularity. Negative upper endoscopy. Normal CEA on 12/13/2012. Status post cycle 1 FOLFOX 12/11/2012. Status post cycle 2 FOLFOX 12/25/2012. Status post cycle 3 on 01/15/2013. Cycle 4 on 01/29/2013; cycle 5 on 02/12/2013; cycle 6 on 02/26/2013. Restaging CT evaluation showed improvement. He completed cycle 8 on 03/26/2013. Oxaliplatin was discontinued following cycle 8 due to progressive neuropathy symptoms. He began maintenance Xeloda 04/24/2013. 2. Cecal/appendix mass noted at the time of exploratory laparotomy 11/13/2012. 3. COPD. 4. Pain secondary to carcinomatosis. 5. Chronic back pain. Stable.  6. Status post Port-A-Cath placement 12/02/2012. 7. Delayed nausea following cycle 1 FOLFOX. Aloxi was added with cycle 2. He had delayed nausea following cycle 2. Emend was added beginning with cycle 3. Prophylactic Decadron added beginning with cycle 8. He did not take as prescribed. He again had delayed nausea. 8. Diarrhea following cycle 2 FOLFOX. Question related to 5-fluorouracil. The diarrhea improved with Lomotil. The 5-fluorouracil bolus was eliminated and the  dose of the 5 fluorouracil infusion was reduced beginning with cycle 3.  9. Left mid back pain/tenderness just lateral to the spine. Question etiology. Duragesic patch increased to 75 mcg every 3 days. He takes oxycodone as needed. He continues to have back  pain. 10. Anorexia/weight loss. He began a trial of Megace following office visit 01/08/2013. Appetite is better.  11. CA 19-9 elevated at 1139 on 12/25/2012. Improved on 01/29/2013. Further improved on 02/26/2013 and 03/12/2013. 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 Increased with moderate loss of vibratory sense at the fingertips per tuning fork exam and interfering with activity when here 04/09/2013. Oxaliplatin was discontinued. 13. Nausea/vomiting with cycle 1 Xeloda.  Disposition-he had significant nausea/vomiting with cycle 1 Xeloda. As as it is unusual to have significant nausea with Xeloda we requested he proceed with cycle 2 as scheduled on 05/15/2013 with instructions to contact the office if the nausea recurs.  We will followup on the CA 19-9 from today.  He will return for a followup visit in 3 weeks. He will contact the office in the interim as outlined above or with any other problems.  Plan reviewed with Dr. Truett Perna.  Lonna Cobb ANP/GNP-BC

## 2013-05-15 NOTE — Telephone Encounter (Addendum)
RECEIVED A FAX FROM [facility] CONCERNING AN INITIAL DISPENSE CONFIRMATION FOR CAPECITABINE ON 05/13/13.

## 2013-06-02 ENCOUNTER — Other Ambulatory Visit (HOSPITAL_BASED_OUTPATIENT_CLINIC_OR_DEPARTMENT_OTHER): Payer: Federal, State, Local not specified - PPO | Admitting: Lab

## 2013-06-02 ENCOUNTER — Telehealth: Payer: Self-pay | Admitting: *Deleted

## 2013-06-02 ENCOUNTER — Ambulatory Visit (HOSPITAL_BASED_OUTPATIENT_CLINIC_OR_DEPARTMENT_OTHER): Payer: Federal, State, Local not specified - PPO | Admitting: Oncology

## 2013-06-02 VITALS — BP 123/67 | HR 52 | Temp 98.1°F | Resp 20 | Ht 69.0 in | Wt 181.7 lb

## 2013-06-02 DIAGNOSIS — G893 Neoplasm related pain (acute) (chronic): Secondary | ICD-10-CM

## 2013-06-02 DIAGNOSIS — C801 Malignant (primary) neoplasm, unspecified: Secondary | ICD-10-CM

## 2013-06-02 DIAGNOSIS — C762 Malignant neoplasm of abdomen: Secondary | ICD-10-CM

## 2013-06-02 DIAGNOSIS — H00019 Hordeolum externum unspecified eye, unspecified eyelid: Secondary | ICD-10-CM

## 2013-06-02 DIAGNOSIS — M549 Dorsalgia, unspecified: Secondary | ICD-10-CM

## 2013-06-02 DIAGNOSIS — C786 Secondary malignant neoplasm of retroperitoneum and peritoneum: Secondary | ICD-10-CM

## 2013-06-02 DIAGNOSIS — R110 Nausea: Secondary | ICD-10-CM

## 2013-06-02 DIAGNOSIS — G620 Drug-induced polyneuropathy: Secondary | ICD-10-CM

## 2013-06-02 LAB — COMPREHENSIVE METABOLIC PANEL (CC13)
ALT: 19 U/L (ref 0–55)
AST: 21 U/L (ref 5–34)
Albumin: 4 g/dL (ref 3.5–5.0)
Alkaline Phosphatase: 61 U/L (ref 40–150)
CO2: 24 mEq/L (ref 22–29)
Potassium: 4.1 mEq/L (ref 3.5–5.1)
Sodium: 137 mEq/L (ref 136–145)
Total Bilirubin: 0.71 mg/dL (ref 0.20–1.20)
Total Protein: 7.6 g/dL (ref 6.4–8.3)

## 2013-06-02 LAB — CBC WITH DIFFERENTIAL/PLATELET
BASO%: 0.6 % (ref 0.0–2.0)
Eosinophils Absolute: 0.1 10*3/uL (ref 0.0–0.5)
HGB: 14.6 g/dL (ref 13.0–17.1)
LYMPH%: 35.9 % (ref 14.0–49.0)
MCHC: 34 g/dL (ref 32.0–36.0)
MCV: 96.6 fL (ref 79.3–98.0)
MONO#: 0.5 10*3/uL (ref 0.1–0.9)
MONO%: 8.3 % (ref 0.0–14.0)
NEUT#: 3.1 10*3/uL (ref 1.5–6.5)
Platelets: 166 10*3/uL (ref 140–400)
RBC: 4.46 10*6/uL (ref 4.20–5.82)
RDW: 18 % — ABNORMAL HIGH (ref 11.0–14.6)
WBC: 5.7 10*3/uL (ref 4.0–10.3)

## 2013-06-02 MED ORDER — METOCLOPRAMIDE HCL 10 MG PO TABS: 10.0000 mg | ORAL_TABLET | Freq: Three times a day (TID) | ORAL | Status: DC

## 2013-06-02 MED ORDER — OXYCODONE HCL 10 MG PO TABS: 10.0000 mg | ORAL_TABLET | ORAL | Status: DC | PRN

## 2013-06-02 NOTE — Progress Notes (Signed)
[facility]    OFFICE PROGRESS NOTE   INTERVAL HISTORY:   He completed another cycle of Xeloda beginning on 05/15/2013. He reports nausea while taking Xeloda and for several days after completing cycle. The nausea did not improve with Zofran or Compazine no significant diarrhea. He complains of pain and numbness in the hands and feet. He feels "cold "most of the time. Occasional abdominal pain. He has developed erythema at the right upper eye lid. He has noted increased tearing.  Objective:  Vital signs in last 24 hours:  Blood pressure 123/67, pulse 52, temperature 98.1 F (36.7 C), temperature source Oral, resp. rate 20, height 5\' 9"  (1.753 m), weight 181 lb 11.2 oz (82.419 kg).    HEENT: Erythema with a "stye" at the right eyelid , no conjunctival erythema. No thrush or ulcers Resp: Clear bilaterally Cardio: Regular rate and rhythm GI: No hepatomegaly, no mass, mild diffuse tenderness Vascular: No leg edema  Skin: Mild erythema at the soles without skin breakdown or blisters. Palms with mild hyperpigmentation, no skin breakdown or erythema.   Portacath/PICC-without erythema  Lab Results:  Lab Results  Component Value Date   WBC 5.7 06/02/2013   HGB 14.6 06/02/2013   HCT 43.0 06/02/2013   MCV 96.6 06/02/2013   PLT 166 06/02/2013   ANC 3.1  CA 19-9 on 05/12/2013-20.1    Medications: I have reviewed the patient's current medications.  Assessment/Plan: 1. 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 CT of the chest 11/18/2012 with borderline enlarged mediastinal nodes, gastrohepatic ligament nodes and omental/peritoneal nodularity. Negative upper endoscopy. Normal CEA on 12/13/2012. Status post cycle 1 FOLFOX 12/11/2012. Status post cycle 2 FOLFOX 12/25/2012. Status post cycle 3 on 01/15/2013. Cycle 4 on  01/29/2013; cycle 5 on 02/12/2013; cycle 6 on 02/26/2013. Restaging CT evaluation showed improvement. He completed cycle 8 on 03/26/2013. Oxaliplatin was discontinued following cycle 8 due to progressive neuropathy symptoms. He began maintenance Xeloda 04/24/2013. 2. Cecal/appendix mass noted at the time of exploratory laparotomy 11/13/2012. 3. COPD. 4. Pain secondary to carcinomatosis. 5. Chronic back pain. Stable.  6. Status post Port-A-Cath placement 12/02/2012. 7. Delayed nausea following cycle 1 FOLFOX. Aloxi was added with cycle 2. He had delayed nausea following cycle 2. Emend was added beginning with cycle 3. Prophylactic Decadron added beginning with cycle 8. He did not take as prescribed. He again had delayed nausea. 8. Diarrhea following cycle 2 FOLFOX. Question related to 5-fluorouracil. The diarrhea improved with Lomotil. The 5-fluorouracil bolus was eliminated and the dose of the 5 fluorouracil infusion was reduced beginning with cycle 3.  9. Left mid back pain/tenderness just lateral to the spine. Question etiology. Duragesic patch increased to 75 mcg every 3 days. He takes oxycodone as needed. He continues to have back pain. 10. Anorexia/weight loss. He began a trial of Megace following office visit 01/08/2013. Appetite is better.  11. CA 19-9 elevated at 1139 on 12/25/2012. Improved on 01/29/2013. Further improved on 02/26/2013, 03/12/2013, and 05/12/2013. 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 Increased with moderate loss of vibratory sense at the fingertips per tuning fork exam and interfering with activity when here 04/09/2013. Oxaliplatin was discontinued. 13. Nausea/vomiting with cycle 1 Xeloda. Persistent with cycle 2. 14. Right eye "stye" 06/02/2013    Disposition:  He has completed 2 cycles of "maintenance "Xeloda. He appears to be  tolerating the Xeloda well aside from nausea.  I explained nausea is an unusual side effect with Xeloda. The nausea could be related to carcinomatosis. He will begin a trial of Reglan. He will use lorazepam as needed for nausea. We discussed the target dyskinesia associated with Reglan. He will discontinue Compazine.  Mr. Seago will begin cycle 3 of Xeloda on 06/05/2013. He will return for an office visit on 06/23/2013. He plans to followup with his primary physician if the "stye "is not improved over the next few days.   Thornton Papas, MD  06/02/2013  1:02 PM

## 2013-06-02 NOTE — Telephone Encounter (Signed)
appts made and printed...td 

## 2013-06-03 LAB — CANCER ANTIGEN 19-9: CA 19-9: 33.8 U/mL (ref <35.0)

## 2013-06-05 ENCOUNTER — Encounter: Payer: Self-pay | Admitting: Physician Assistant

## 2013-06-05 ENCOUNTER — Ambulatory Visit (INDEPENDENT_AMBULATORY_CARE_PROVIDER_SITE_OTHER): Payer: Federal, State, Local not specified - PPO | Admitting: Physician Assistant

## 2013-06-05 VITALS — BP 126/82 | HR 60 | Temp 98.3°F | Resp 18 | Wt 184.0 lb

## 2013-06-05 DIAGNOSIS — H00013 Hordeolum externum right eye, unspecified eyelid: Secondary | ICD-10-CM

## 2013-06-05 DIAGNOSIS — H00019 Hordeolum externum unspecified eye, unspecified eyelid: Secondary | ICD-10-CM

## 2013-06-05 MED ORDER — SULFACETAMIDE SODIUM 10 % OP OINT: TOPICAL_OINTMENT | Freq: Four times a day (QID) | OPHTHALMIC | Status: DC

## 2013-06-05 NOTE — Progress Notes (Signed)
Patient ID: Frank Baird MRN: [id number], DOB: 11/10/1960, 52 y.o. Date of Encounter: 06/05/2013, 11:26 AM    Chief Complaint:  Chief Complaint  Patient presents with  . lesion on rt eye lid     HPI: 52 y.o. year old male is that he first noticed a little bump on his right upper eyelid 3-4 days ago. He has been applying warm compresses. However it is not resolving.     Home Meds: See attached medication section for any medications that were entered at today's visit. The computer does not put those onto this list.The following list is a list of meds entered prior to today's visit.   Current Outpatient Prescriptions on File Prior to Visit  Medication Sig Dispense Refill  . albuterol (PROVENTIL HFA;VENTOLIN HFA) 108 (90 BASE) MCG/ACT inhaler Inhale 2 puffs into the lungs every 6 (six) hours as needed for wheezing.  1 Inhaler  2  . capecitabine (XELODA) 500 MG tablet Take 3 tablets (1,500 mg total) by mouth 2 (two) times daily after a meal. X 14 days on, 7 days rest  84 tablet  1  . diphenoxylate-atropine (LOMOTIL) 2.5-0.025 MG per tablet Take 1 tablet by mouth 4 (four) times daily as needed for diarrhea or loose stools.  30 tablet  0  . esomeprazole (NEXIUM) 40 MG capsule Take 1 capsule (40 mg total) by mouth daily before breakfast.  90 capsule  3  . fentaNYL (DURAGESIC - DOSED MCG/HR) 75 MCG/HR Place 1 patch (75 mcg total) onto the skin every 3 (three) days.  10 patch  0  . lidocaine-prilocaine (EMLA) cream Apply to port a cath site one hour prior to chemo. Do not rub in. Cover with plastic.  30 g  2  . lisinopril (PRINIVIL,ZESTRIL) 20 MG tablet TAKE 1 TABLET BY MOUTH EVERY DAY  90 tablet  1  . LORazepam (ATIVAN) 1 MG tablet Take 1 tablet (1 mg total) by mouth every 6 (six) hours as needed for anxiety.  50 tablet  0  . metoCLOPramide (REGLAN) 10 MG tablet Take 1 tablet (10 mg total) by mouth 3 (three) times daily before meals.  90 tablet  1  . naproxen sodium (ANAPROX) 220 MG tablet  Take 220 mg by mouth 2 (two) times daily as needed.       . ondansetron (ZOFRAN) 8 MG tablet Take 1 tablet (8 mg total) by mouth every 8 (eight) hours as needed for nausea.  20 tablet  2  . Oxycodone HCl 10 MG TABS Take 1 tablet (10 mg total) by mouth every 4 (four) hours as needed.  60 tablet  0  . pioglitazone (ACTOS) 30 MG tablet TAKE 1 TABLET BY MOUTH EVERY DAY  90 tablet  2   No current facility-administered medications on file prior to visit.    Allergies:  Allergies  Allergen Reactions  . Bee Venom     Unsure of reaction, Stung as baby and almost died  . Codeine     Bad stomach cramps    . Penicillins Other (See Comments)    unknown      Review of Systems: See HPI for pertinent ROS. All other ROS negative.    Physical Exam: Blood pressure 126/82, pulse 60, temperature 98.3 F (36.8 C), temperature source Oral, resp. rate 18, weight 184 lb (83.462 kg)., Body mass index is 27.16 kg/(m^2). General: WNWD WM  Appears in no acute distress. HEENT: Right Eye: Upper eyelid: about midline-at the distal edge of  the eyelid, right where the eyelashes originate- there is an approximate 0.5 cm diameter erythematous papule. It is on the external aspect of the lid. Remainder of the eye is normal. Neck: Supple. No thyromegaly. No lymphadenopathy. Lungs: Clear bilaterally to auscultation without wheezes, rales, or rhonchi. Breathing is unlabored. Heart: Regular rhythm. No murmurs, rubs, or gallops. Msk:  Strength and tone normal for age. Extremities/Skin: Warm and dry. No clubbing or cyanosis. No edema. No rashes or suspicious lesions. Neuro: Alert and oriented X 3. Moves all extremities spontaneously. Gait is normal. CNII-XII grossly in tact. Psych:  Responds to questions appropriately with a normal affect.     ASSESSMENT AND PLAN:  52 y.o. year old male with  1. Hordeolum externum, right - sulfacetamide (BLEPH-10) 10 % ophthalmic ointment; Place into the right eye every 6 (six)  hours.  Dispense: 3.5 g; Refill: 0 Every 6 hours, apply warm compress to the eye for several minutes. Allow the eye to dry. Then, apply the above ointment. If the lesion worsens or does not resolve within 1 week then followup.  Signed, [address] [place], [place], [facility] 06/05/2013 11:26 AM

## 2013-06-13 ENCOUNTER — Telehealth: Payer: Self-pay | Admitting: *Deleted

## 2013-06-13 DIAGNOSIS — C801 Malignant (primary) neoplasm, unspecified: Secondary | ICD-10-CM

## 2013-06-13 DIAGNOSIS — C762 Malignant neoplasm of abdomen: Secondary | ICD-10-CM

## 2013-06-13 MED ORDER — LORAZEPAM 1 MG PO TABS: 1.0000 mg | ORAL_TABLET | Freq: Four times a day (QID) | ORAL | Status: DC | PRN

## 2013-06-13 MED ORDER — FENTANYL 75 MCG/HR TD PT72: 75.0000 ug | MEDICATED_PATCH | TRANSDERMAL | Status: DC

## 2013-06-13 NOTE — Telephone Encounter (Signed)
Message from pt requesting refill on Fentanyl and Lorazepam. Rx will be left in prescription book for pick up. Pt made aware.

## 2013-06-23 ENCOUNTER — Other Ambulatory Visit (HOSPITAL_BASED_OUTPATIENT_CLINIC_OR_DEPARTMENT_OTHER): Payer: Federal, State, Local not specified - PPO

## 2013-06-23 ENCOUNTER — Ambulatory Visit (HOSPITAL_BASED_OUTPATIENT_CLINIC_OR_DEPARTMENT_OTHER): Payer: Federal, State, Local not specified - PPO | Admitting: Nurse Practitioner

## 2013-06-23 ENCOUNTER — Ambulatory Visit (HOSPITAL_BASED_OUTPATIENT_CLINIC_OR_DEPARTMENT_OTHER): Payer: Federal, State, Local not specified - PPO

## 2013-06-23 ENCOUNTER — Telehealth: Payer: Self-pay | Admitting: Oncology

## 2013-06-23 VITALS — BP 139/76 | HR 52 | Temp 97.6°F | Resp 20 | Ht 69.0 in | Wt 186.9 lb

## 2013-06-23 DIAGNOSIS — C786 Secondary malignant neoplasm of retroperitoneum and peritoneum: Secondary | ICD-10-CM

## 2013-06-23 DIAGNOSIS — C801 Malignant (primary) neoplasm, unspecified: Secondary | ICD-10-CM

## 2013-06-23 DIAGNOSIS — C762 Malignant neoplasm of abdomen: Secondary | ICD-10-CM

## 2013-06-23 DIAGNOSIS — G893 Neoplasm related pain (acute) (chronic): Secondary | ICD-10-CM

## 2013-06-23 DIAGNOSIS — J449 Chronic obstructive pulmonary disease, unspecified: Secondary | ICD-10-CM

## 2013-06-23 LAB — CBC WITH DIFFERENTIAL/PLATELET
Eosinophils Absolute: 0.1 10*3/uL (ref 0.0–0.5)
HCT: 41.6 % (ref 38.4–49.9)
HGB: 14.4 g/dL (ref 13.0–17.1)
LYMPH%: 15.5 % (ref 14.0–49.0)
MONO#: 0.5 10*3/uL (ref 0.1–0.9)
NEUT#: 7.2 10*3/uL — ABNORMAL HIGH (ref 1.5–6.5)
NEUT%: 77.9 % — ABNORMAL HIGH (ref 39.0–75.0)
Platelets: 160 10*3/uL (ref 140–400)
RBC: 4.29 10*6/uL (ref 4.20–5.82)
WBC: 9.3 10*3/uL (ref 4.0–10.3)

## 2013-06-23 MED ORDER — SODIUM CHLORIDE 0.9 % IJ SOLN
10.0000 mL | INTRAMUSCULAR | Status: DC | PRN
  Administered 2013-06-23: 10 mL via INTRAVENOUS
  Filled 2013-06-23: qty 10

## 2013-06-23 MED ORDER — CAPECITABINE 500 MG PO TABS: 1500.0000 mg | ORAL_TABLET | Freq: Two times a day (BID) | ORAL | Status: DC

## 2013-06-23 MED ORDER — HEPARIN SOD (PORK) LOCK FLUSH 100 UNIT/ML IV SOLN
500.0000 [IU] | Freq: Once | INTRAVENOUS | Status: AC
  Administered 2013-06-23: 500 [IU] via INTRAVENOUS
  Filled 2013-06-23: qty 5

## 2013-06-23 MED ORDER — OXYCODONE HCL 10 MG PO TABS: 10.0000 mg | ORAL_TABLET | ORAL | Status: DC | PRN

## 2013-06-23 NOTE — Progress Notes (Signed)
OFFICE PROGRESS NOTE  Interval history:  Frank Baird returns as scheduled. He completed cycle 3 Xeloda beginning 06/06/2013. He again had nausea with a few episodes of vomiting. The nausea was better as compared to cycle 2. He denies mouth sores. He has loose stools every 1-2 days. No hand or foot redness. He has persistent numbness, cold sensitivity and some pain in the hands and feet. He notes watery eyes. Abdominal pain is intermittent and varies in intensity. He has constant chronic back pain. He takes oxycodone as needed.   Objective: Blood pressure 139/76, pulse 52, temperature 97.6 F (36.4 C), temperature source Oral, resp. rate 20, height 5\' 9"  (1.753 m), weight 186 lb 14.4 oz (84.777 kg).  Oropharynx is without thrush or ulceration. Lungs are clear. Regular cardiac rhythm. Port-A-Cath site is without erythema. Abdomen is soft with mild diffuse tenderness. No hepatomegaly. No mass. No leg edema. Palms are without erythema. No skin breakdown.  Lab Results: Lab Results  Component Value Date   WBC 9.3 06/23/2013   HGB 14.4 06/23/2013   HCT 41.6 06/23/2013   MCV 97.1 06/23/2013   PLT 160 06/23/2013    Chemistry:    Chemistry      Component Value Date/Time   NA 137 06/02/2013 1059   NA 129* 11/17/2012 0446   K 4.1 06/02/2013 1059   K 3.9 11/17/2012 0446   CL 98 12/25/2012 0903   CL 97 11/17/2012 0446   CO2 24 06/02/2013 1059   CO2 26 11/17/2012 0446   BUN 9.7 06/02/2013 1059   BUN 6 11/17/2012 0446   CREATININE 1.0 06/02/2013 1059   CREATININE 0.81 11/17/2012 0446   CREATININE 0.72 11/12/2012 1430      Component Value Date/Time   CALCIUM 9.9 06/02/2013 1059   CALCIUM 8.7 11/17/2012 0446   ALKPHOS 61 06/02/2013 1059   ALKPHOS 124* 11/12/2012 1850   AST 21 06/02/2013 1059   AST 21 11/12/2012 1850   ALT 19 06/02/2013 1059   ALT 47 11/12/2012 1850   BILITOT 0.71 06/02/2013 1059   BILITOT 0.7 11/12/2012 1850       Studies/Results: No results found.  Medications: I have reviewed the  patient's current medications.  Assessment/Plan:  1. 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 CT of the chest 11/18/2012 with borderline enlarged mediastinal nodes, gastrohepatic ligament nodes and omental/peritoneal nodularity. Negative upper endoscopy. Normal CEA on 12/13/2012. Status post cycle 1 FOLFOX 12/11/2012. Status post cycle 2 FOLFOX 12/25/2012. Status post cycle 3 on 01/15/2013. Cycle 4 on 01/29/2013; cycle 5 on 02/12/2013; cycle 6 on 02/26/2013. Restaging CT evaluation showed improvement. He completed cycle 8 on 03/26/2013. Oxaliplatin was discontinued following cycle 8 due to progressive neuropathy symptoms. He began maintenance Xeloda 04/24/2013. 2. Cecal/appendix mass noted at the time of exploratory laparotomy 11/13/2012. 3. COPD. 4. Pain secondary to carcinomatosis. 5. Chronic back pain. Stable.  6. Status post Port-A-Cath placement 12/02/2012. 7. Delayed nausea following cycle 1 FOLFOX. Aloxi was added with cycle 2. He had delayed nausea following cycle 2. Emend was added beginning with cycle 3. Prophylactic Decadron added beginning with cycle 8. He did not take as prescribed. He again had delayed nausea. 8. Diarrhea following cycle 2 FOLFOX. Question related to 5-fluorouracil. The diarrhea improved with Lomotil. The 5-fluorouracil bolus was eliminated and the dose of the 5 fluorouracil infusion was reduced beginning with cycle 3.  9. Left mid back pain/tenderness just lateral to the  spine. Question etiology. Duragesic patch increased to 75 mcg every 3 days. He takes oxycodone as needed. He continues to have back pain. 10. Anorexia/weight loss. He began a trial of Megace following office visit 01/08/2013. Appetite is better.  11. CA 19-9 elevated at 1139 on 12/25/2012. Improved on 01/29/2013. Further improved on 02/26/2013,  03/12/2013, and 05/12/2013. Increased in the normal range at 33.8 on 06/02/2013. 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 Increased with moderate loss of vibratory sense at the fingertips per tuning fork exam and interfering with activity when here 04/09/2013. Oxaliplatin was discontinued. 13. Nausea/vomiting with cycle 1 Xeloda. Persistent with cycle 2. Improved with cycle 3. 14. Right eye "stye" 06/02/2013 15. "Watery eyes".  Disposition-he appears stable. He has completed 3 cycles of Xeloda. He is scheduled to begin cycle 4 on 06/27/2013. He requests to wait until after the holidays to begin cycle 4. He will begin cycle 4 on 07/04/2013. We will see him in followup on 07/22/2013. He will contact the office in the interim with any problems.  Lonna Cobb ANP/GNP-BC

## 2013-06-23 NOTE — Telephone Encounter (Signed)
gv and printed papt sched and avs for pt for Jan and Feb 2015 °

## 2013-06-23 NOTE — Patient Instructions (Signed)
Today you had your port flushed. Continue to have port flushed every 6-8 weeks 

## 2013-07-22 ENCOUNTER — Telehealth: Payer: Self-pay | Admitting: Oncology

## 2013-07-22 ENCOUNTER — Other Ambulatory Visit (HOSPITAL_BASED_OUTPATIENT_CLINIC_OR_DEPARTMENT_OTHER): Payer: Federal, State, Local not specified - PPO

## 2013-07-22 ENCOUNTER — Ambulatory Visit (HOSPITAL_BASED_OUTPATIENT_CLINIC_OR_DEPARTMENT_OTHER): Payer: Federal, State, Local not specified - PPO | Admitting: Oncology

## 2013-07-22 VITALS — BP 127/66 | HR 50 | Temp 98.0°F | Resp 18 | Ht 69.0 in | Wt 183.3 lb

## 2013-07-22 DIAGNOSIS — G609 Hereditary and idiopathic neuropathy, unspecified: Secondary | ICD-10-CM

## 2013-07-22 DIAGNOSIS — C801 Malignant (primary) neoplasm, unspecified: Secondary | ICD-10-CM

## 2013-07-22 DIAGNOSIS — C786 Secondary malignant neoplasm of retroperitoneum and peritoneum: Secondary | ICD-10-CM

## 2013-07-22 DIAGNOSIS — C762 Malignant neoplasm of abdomen: Secondary | ICD-10-CM

## 2013-07-22 DIAGNOSIS — G893 Neoplasm related pain (acute) (chronic): Secondary | ICD-10-CM

## 2013-07-22 LAB — CBC WITH DIFFERENTIAL/PLATELET
BASO%: 1 % (ref 0.0–2.0)
Basophils Absolute: 0.1 10*3/uL (ref 0.0–0.1)
EOS ABS: 0.1 10*3/uL (ref 0.0–0.5)
EOS%: 1.3 % (ref 0.0–7.0)
HCT: 44.6 % (ref 38.4–49.9)
HGB: 15.5 g/dL (ref 13.0–17.1)
LYMPH%: 31.7 % (ref 14.0–49.0)
MCH: 33.6 pg — AB (ref 27.2–33.4)
MCHC: 34.6 g/dL (ref 32.0–36.0)
MCV: 96.9 fL (ref 79.3–98.0)
MONO#: 0.7 10*3/uL (ref 0.1–0.9)
MONO%: 8.9 % (ref 0.0–14.0)
NEUT%: 57.1 % (ref 39.0–75.0)
NEUTROS ABS: 4.3 10*3/uL (ref 1.5–6.5)
PLATELETS: 181 10*3/uL (ref 140–400)
RBC: 4.6 10*6/uL (ref 4.20–5.82)
RDW: 17.2 % — ABNORMAL HIGH (ref 11.0–14.6)
WBC: 7.5 10*3/uL (ref 4.0–10.3)
lymph#: 2.4 10*3/uL (ref 0.9–3.3)

## 2013-07-22 LAB — COMPREHENSIVE METABOLIC PANEL (CC13)
ALK PHOS: 50 U/L (ref 40–150)
ALT: 14 U/L (ref 0–55)
AST: 18 U/L (ref 5–34)
Albumin: 4.3 g/dL (ref 3.5–5.0)
Anion Gap: 9 mEq/L (ref 3–11)
BUN: 13.2 mg/dL (ref 7.0–26.0)
CO2: 26 mEq/L (ref 22–29)
Calcium: 9.9 mg/dL (ref 8.4–10.4)
Chloride: 101 mEq/L (ref 98–109)
Creatinine: 1 mg/dL (ref 0.7–1.3)
GLUCOSE: 140 mg/dL (ref 70–140)
Potassium: 4 mEq/L (ref 3.5–5.1)
SODIUM: 136 meq/L (ref 136–145)
TOTAL PROTEIN: 7.8 g/dL (ref 6.4–8.3)
Total Bilirubin: 0.72 mg/dL (ref 0.20–1.20)

## 2013-07-22 MED ORDER — FENTANYL 75 MCG/HR TD PT72: 75.0000 ug | MEDICATED_PATCH | TRANSDERMAL | Status: DC

## 2013-07-22 MED ORDER — OXYCODONE HCL 10 MG PO TABS: 10.0000 mg | ORAL_TABLET | ORAL | Status: DC | PRN

## 2013-07-22 MED ORDER — CAPECITABINE 500 MG PO TABS: 1000.0000 mg | ORAL_TABLET | Freq: Two times a day (BID) | ORAL | Status: DC

## 2013-07-22 MED ORDER — LORAZEPAM 1 MG PO TABS: 1.0000 mg | ORAL_TABLET | Freq: Four times a day (QID) | ORAL | Status: DC | PRN

## 2013-07-22 NOTE — Progress Notes (Signed)
Frank Baird    OFFICE PROGRESS NOTE   INTERVAL HISTORY:   He completed another cycle of Xeloda on 07/18/2013. He continues to have nausea while taking Xeloda. Occasional diarrhea. No mouth sores or hand/foot pain. He continues to have numbness in the hands and feet. Stable back pain. The nausea is relieved with Ativan. He also believes Reglan helped the nausea. The nausea has been better this week. He complains of increased arthritis pain.  Objective:  Vital signs in last 24 hours:  Blood pressure 127/66, pulse 50, temperature 98 F (36.7 C), temperature source Oral, resp. rate 18, height 5\' 9"  (1.753 m), weight 183 lb 4.8 oz (83.144 kg).    HEENT: No thrush or ulcer Resp: Bronchial sounds at the upper posterior chest on the right greater than left, no respiratory distress Cardio: Regular rate and rhythm GI: No hepatomegaly, no mass, no apparent ascites, tender in the right upper abdomen Vascular: No leg edema  Skin: Hyperpigmentation over the palms and soles without skin breakdown or erythema   Portacath/PICC-without erythema  Lab Results:  Lab Results  Component Value Date   WBC 7.5 07/22/2013   HGB 15.5 07/22/2013   HCT 44.6 07/22/2013   MCV 96.9 07/22/2013   PLT 181 07/22/2013   NEUTROABS 4.3 07/22/2013      Medications: I have reviewed the patient's current medications.  Assessment/Plan: 1. 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 CT of the chest 11/18/2012 with borderline enlarged mediastinal nodes, gastrohepatic ligament nodes and omental/peritoneal nodularity. Negative upper endoscopy. Normal CEA on 12/13/2012. Status post cycle 1 FOLFOX 12/11/2012. Status post cycle 2 FOLFOX 12/25/2012. Status post cycle 3 on 01/15/2013. Cycle 4 on 01/29/2013; cycle 5 on 02/12/2013; cycle 6 on 02/26/2013. Restaging CT  evaluation showed improvement. He completed cycle 8 on 03/26/2013. Oxaliplatin was discontinued following cycle 8 due to progressive neuropathy symptoms. He began maintenance Xeloda 04/24/2013. 2. Cecal/appendix mass noted at the time of exploratory laparotomy 11/13/2012. 3. COPD. 4. Pain secondary to carcinomatosis. 5. Chronic back pain. Stable.  6. Status post Port-A-Cath placement 12/02/2012. 7. Delayed nausea following cycle 1 FOLFOX. Aloxi was added with cycle 2. He had delayed nausea following cycle 2. Emend was added beginning with cycle 3. Prophylactic Decadron added beginning with cycle 8. He did not take as prescribed. He again had delayed nausea. 8. Diarrhea following cycle 2 FOLFOX. Question related to 5-fluorouracil. The diarrhea improved with Lomotil. The 5-fluorouracil bolus was eliminated and the dose of the 5 fluorouracil infusion was reduced beginning with cycle 3.  9. Left mid back pain/tenderness just lateral to the spine. Question etiology. Duragesic patch increased to 75 mcg every 3 days. He takes oxycodone as needed. He continues to have back pain. 10. Anorexia/weight loss. He began a trial of Megace following office visit 01/08/2013. Appetite is better. No longer taking Megace.  11. CA 19-9 elevated at 1139 on 12/25/2012. Improved on 01/29/2013. Further improved on 02/26/2013, 03/12/2013, and 05/12/2013. Increased in the normal range at 33.8 on 06/02/2013. 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 Increased with moderate loss of vibratory sense at the fingertips per tuning fork exam and interfering with activity when here 04/09/2013. Oxaliplatin was discontinued. 13. Nausea/vomiting with cycle 1 Xeloda. He has persistent nausea while on Xeloda. 14. Right eye "stye" 06/02/2013   Disposition:  Frank Baird appears stable. He has  completed 4 cycles of maintenance Xeloda therapy. He  appears to be tolerating the Xeloda well a side from nausea. He requested a dose reduction of Xeloda. We dose reduced the Xeloda to 1000 mg twice daily beginning with cycle 5 on 07/26/2013. He will return for an office visit and Port-A-Cath flush on 08/13/2013. We checked and the CA 19-9 today.  Betsy Coder, MD  07/22/2013  10:22 AM

## 2013-07-22 NOTE — Telephone Encounter (Signed)
Gave pt appt for lab and ML for February 2015

## 2013-07-23 LAB — CANCER ANTIGEN 19-9: CA 19 9: 105.2 U/mL — AB (ref <35.0)

## 2013-08-13 ENCOUNTER — Ambulatory Visit: Payer: Federal, State, Local not specified - PPO

## 2013-08-13 ENCOUNTER — Other Ambulatory Visit (HOSPITAL_BASED_OUTPATIENT_CLINIC_OR_DEPARTMENT_OTHER): Payer: Federal, State, Local not specified - PPO

## 2013-08-13 ENCOUNTER — Telehealth: Payer: Self-pay | Admitting: Oncology

## 2013-08-13 ENCOUNTER — Ambulatory Visit (HOSPITAL_BASED_OUTPATIENT_CLINIC_OR_DEPARTMENT_OTHER): Payer: Federal, State, Local not specified - PPO | Admitting: Nurse Practitioner

## 2013-08-13 ENCOUNTER — Ambulatory Visit (HOSPITAL_BASED_OUTPATIENT_CLINIC_OR_DEPARTMENT_OTHER): Payer: Federal, State, Local not specified - PPO

## 2013-08-13 VITALS — BP 131/71 | HR 51 | Temp 98.3°F | Resp 18 | Ht 69.0 in | Wt 189.0 lb

## 2013-08-13 DIAGNOSIS — Z452 Encounter for adjustment and management of vascular access device: Secondary | ICD-10-CM

## 2013-08-13 DIAGNOSIS — C801 Malignant (primary) neoplasm, unspecified: Secondary | ICD-10-CM

## 2013-08-13 DIAGNOSIS — C762 Malignant neoplasm of abdomen: Secondary | ICD-10-CM

## 2013-08-13 DIAGNOSIS — C772 Secondary and unspecified malignant neoplasm of intra-abdominal lymph nodes: Secondary | ICD-10-CM

## 2013-08-13 DIAGNOSIS — Z95828 Presence of other vascular implants and grafts: Secondary | ICD-10-CM

## 2013-08-13 LAB — CBC WITH DIFFERENTIAL/PLATELET
BASO%: 0.8 % (ref 0.0–2.0)
BASOS ABS: 0.1 10*3/uL (ref 0.0–0.1)
EOS%: 1.7 % (ref 0.0–7.0)
Eosinophils Absolute: 0.1 10*3/uL (ref 0.0–0.5)
HCT: 43.3 % (ref 38.4–49.9)
HGB: 15.1 g/dL (ref 13.0–17.1)
LYMPH%: 30.7 % (ref 14.0–49.0)
MCH: 34 pg — AB (ref 27.2–33.4)
MCHC: 34.8 g/dL (ref 32.0–36.0)
MCV: 97.6 fL (ref 79.3–98.0)
MONO#: 0.6 10*3/uL (ref 0.1–0.9)
MONO%: 7 % (ref 0.0–14.0)
NEUT#: 5.2 10*3/uL (ref 1.5–6.5)
NEUT%: 59.8 % (ref 39.0–75.0)
PLATELETS: 212 10*3/uL (ref 140–400)
RBC: 4.44 10*6/uL (ref 4.20–5.82)
RDW: 16.3 % — ABNORMAL HIGH (ref 11.0–14.6)
WBC: 8.7 10*3/uL (ref 4.0–10.3)
lymph#: 2.7 10*3/uL (ref 0.9–3.3)

## 2013-08-13 MED ORDER — SODIUM CHLORIDE 0.9 % IJ SOLN
10.0000 mL | INTRAMUSCULAR | Status: DC | PRN
  Administered 2013-08-13: 10 mL via INTRAVENOUS
  Filled 2013-08-13: qty 10

## 2013-08-13 MED ORDER — OXYCODONE HCL 10 MG PO TABS: 10.0000 mg | ORAL_TABLET | ORAL | Status: DC | PRN

## 2013-08-13 MED ORDER — FENTANYL 75 MCG/HR TD PT72: 75.0000 ug | MEDICATED_PATCH | TRANSDERMAL | Status: DC

## 2013-08-13 MED ORDER — HEPARIN SOD (PORK) LOCK FLUSH 100 UNIT/ML IV SOLN
500.0000 [IU] | Freq: Once | INTRAVENOUS | Status: AC
Start: 2013-08-13 — End: 2013-08-13
  Administered 2013-08-13: 500 [IU] via INTRAVENOUS
  Filled 2013-08-13: qty 5

## 2013-08-13 NOTE — Patient Instructions (Signed)

## 2013-08-13 NOTE — Telephone Encounter (Signed)
gv pt appt schedule for march. central will call w/ct appt.  °

## 2013-08-13 NOTE — Telephone Encounter (Signed)
central call to have 3/2 lb change to 11:30am to be coord w/ct time for 12/30pm. s/w pt he is aware and per pt he has also been contacted by central

## 2013-08-13 NOTE — Progress Notes (Signed)
OFFICE PROGRESS NOTE  Interval history:  Frank Baird returns as scheduled. He completed the most recent cycle of Xeloda beginning 07/26/2013. He had less nausea than with previous cycles. No vomiting. He denies mouth sores. Intermittent loose stools. He notes mild erythema on the hands and feet. No skin breakdown. Chronic pain is stable.   Objective: Filed Vitals:   08/13/13 1034  BP: 131/71  Pulse: 51  Temp: 98.3 F (36.8 C)  Resp: 18   Oropharynx is without thrush or ulceration. Lungs are clear. No wheezes or rales. Regular cardiac rhythm. Port-A-Cath site without erythema. Abdomen is soft with mild tenderness at the right upper quadrant. No hepatomegaly. No apparent ascites. No leg edema. Mild erythema over the palms. No skin breakdown.   Lab Results: Lab Results  Component Value Date   WBC 8.7 08/13/2013   HGB 15.1 08/13/2013   HCT 43.3 08/13/2013   MCV 97.6 08/13/2013   PLT 212 08/13/2013   NEUTROABS 5.2 08/13/2013    Chemistry:    Chemistry      Component Value Date/Time   NA 136 07/22/2013 0916   NA 129* 11/17/2012 0446   K 4.0 07/22/2013 0916   K 3.9 11/17/2012 0446   CL 98 12/25/2012 0903   CL 97 11/17/2012 0446   CO2 26 07/22/2013 0916   CO2 26 11/17/2012 0446   BUN 13.2 07/22/2013 0916   BUN 6 11/17/2012 0446   CREATININE 1.0 07/22/2013 0916   CREATININE 0.81 11/17/2012 0446   CREATININE 0.72 11/12/2012 1430      Component Value Date/Time   CALCIUM 9.9 07/22/2013 0916   CALCIUM 8.7 11/17/2012 0446   ALKPHOS 50 07/22/2013 0916   ALKPHOS 124* 11/12/2012 1850   AST 18 07/22/2013 0916   AST 21 11/12/2012 1850   ALT 14 07/22/2013 0916   ALT 47 11/12/2012 1850   BILITOT 0.72 07/22/2013 0916   BILITOT 0.7 11/12/2012 1850       Studies/Results: No results found.  Medications: I have reviewed the patient's current medications.  Assessment/Plan: 1. 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 CT of the chest 11/18/2012 with borderline enlarged mediastinal nodes, gastrohepatic ligament nodes and omental/peritoneal nodularity. Negative upper endoscopy. Normal CEA on 12/13/2012. Status post cycle 1 FOLFOX 12/11/2012. Status post cycle 2 FOLFOX 12/25/2012. Status post cycle 3 on 01/15/2013. Cycle 4 on 01/29/2013; cycle 5 on 02/12/2013; cycle 6 on 02/26/2013. Restaging CT evaluation showed improvement. He completed cycle 8 on 03/26/2013. Oxaliplatin was discontinued following cycle 8 due to progressive neuropathy symptoms. He began maintenance Xeloda 04/24/2013. Xeloda dose reduced beginning with cycle 5 on 07/26/2013.   CA 19-9 increased at 105 on 07/22/2013 (33.8 on 06/02/2013). 2. Cecal/appendix mass noted at the time of exploratory laparotomy 11/13/2012. 3. COPD. 4. Pain secondary to carcinomatosis. 5. Chronic back pain. Stable.  6. Status post Port-A-Cath placement 12/02/2012. 7. Delayed nausea following cycle 1 FOLFOX. Aloxi was added with cycle 2. He had delayed nausea following cycle 2. Emend was added beginning with cycle 3. Prophylactic Decadron added beginning with cycle 8. He did not take as prescribed. He again had delayed nausea. 8. Diarrhea following cycle 2 FOLFOX. Question related to 5-fluorouracil. The diarrhea improved with Lomotil. The 5-fluorouracil bolus was eliminated and the dose of the 5 fluorouracil infusion was reduced beginning with cycle 3.  9. Left mid back pain/tenderness just lateral to the spine. Question etiology. Duragesic patch increased to  75 mcg every 3 days. He takes oxycodone as needed. He continues to have back pain. 10. Anorexia/weight loss. He began a trial of Megace following office visit 01/08/2013. Appetite is better. No longer taking Megace.  11. CA 19-9 elevated at 1139 on 12/25/2012. Improved on 01/29/2013. Further improved on 02/26/2013, 03/12/2013, and 05/12/2013. Increased  in the normal range at 33.8 on 06/02/2013. Increased 07/22/2013 with a value of 105. 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 Increased with moderate loss of vibratory sense at the fingertips per tuning fork exam and interfering with activity when here 04/09/2013. Oxaliplatin was discontinued. 13. Nausea/vomiting with cycle 1 Xeloda. He has persistent nausea while on Xeloda. Improved with dose reduction beginning with cycle 5. 14. Right eye "stye" 06/02/2013  Dispositon-he appears stable. Plan to proceed with cycle 6 Xeloda as scheduled on 08/16/2013. We will followup on the repeat CA 19-9 from today. Dr. Benay Baird recommends restaging CT scans prior to his next visit in 3 weeks.  Frank Baird will return for a followup visit on 09/03/2013. He will contact the office in the interim with any problems.   Ned Card ANP/GNP-BC

## 2013-08-14 LAB — CANCER ANTIGEN 19-9: CA 19-9: 197.8 U/mL — ABNORMAL HIGH (ref <35.0)

## 2013-09-01 ENCOUNTER — Encounter (INDEPENDENT_AMBULATORY_CARE_PROVIDER_SITE_OTHER): Payer: Self-pay

## 2013-09-01 ENCOUNTER — Other Ambulatory Visit (HOSPITAL_BASED_OUTPATIENT_CLINIC_OR_DEPARTMENT_OTHER): Payer: Federal, State, Local not specified - PPO

## 2013-09-01 ENCOUNTER — Encounter (HOSPITAL_COMMUNITY): Payer: Self-pay

## 2013-09-01 ENCOUNTER — Ambulatory Visit (HOSPITAL_COMMUNITY)
Admission: RE | Admit: 2013-09-01 | Discharge: 2013-09-01 | Disposition: A | Discharge status: Home / Self Care | Payer: Federal, State, Local not specified - PPO | Source: Ambulatory Visit | Attending: Nurse Practitioner | Admitting: Nurse Practitioner

## 2013-09-01 DIAGNOSIS — C801 Malignant (primary) neoplasm, unspecified: Secondary | ICD-10-CM

## 2013-09-01 DIAGNOSIS — C786 Secondary malignant neoplasm of retroperitoneum and peritoneum: Secondary | ICD-10-CM | POA: Insufficient documentation

## 2013-09-01 DIAGNOSIS — R911 Solitary pulmonary nodule: Secondary | ICD-10-CM | POA: Insufficient documentation

## 2013-09-01 DIAGNOSIS — C772 Secondary and unspecified malignant neoplasm of intra-abdominal lymph nodes: Secondary | ICD-10-CM

## 2013-09-01 DIAGNOSIS — R188 Other ascites: Secondary | ICD-10-CM | POA: Insufficient documentation

## 2013-09-01 DIAGNOSIS — C762 Malignant neoplasm of abdomen: Secondary | ICD-10-CM

## 2013-09-01 LAB — CBC WITH DIFFERENTIAL/PLATELET
BASO%: 1.1 % (ref 0.0–2.0)
Basophils Absolute: 0.1 10*3/uL (ref 0.0–0.1)
EOS%: 1.7 % (ref 0.0–7.0)
Eosinophils Absolute: 0.1 10*3/uL (ref 0.0–0.5)
HCT: 42.8 % (ref 38.4–49.9)
HGB: 14.5 g/dL (ref 13.0–17.1)
LYMPH%: 37.6 % (ref 14.0–49.0)
MCH: 32.6 pg (ref 27.2–33.4)
MCHC: 34 g/dL (ref 32.0–36.0)
MCV: 96 fL (ref 79.3–98.0)
MONO#: 0.6 10*3/uL (ref 0.1–0.9)
MONO%: 7.4 % (ref 0.0–14.0)
NEUT#: 3.9 10*3/uL (ref 1.5–6.5)
NEUT%: 52.2 % (ref 39.0–75.0)
Platelets: 191 10*3/uL (ref 140–400)
RBC: 4.46 10*6/uL (ref 4.20–5.82)
RDW: 14.9 % — AB (ref 11.0–14.6)
WBC: 7.6 10*3/uL (ref 4.0–10.3)
lymph#: 2.8 10*3/uL (ref 0.9–3.3)

## 2013-09-01 LAB — COMPREHENSIVE METABOLIC PANEL (CC13)
ALBUMIN: 4.2 g/dL (ref 3.5–5.0)
ALK PHOS: 48 U/L (ref 40–150)
ALT: 10 U/L (ref 0–55)
AST: 16 U/L (ref 5–34)
Anion Gap: 7 mEq/L (ref 3–11)
BUN: 10.2 mg/dL (ref 7.0–26.0)
CO2: 26 mEq/L (ref 22–29)
Calcium: 9.8 mg/dL (ref 8.4–10.4)
Chloride: 106 mEq/L (ref 98–109)
Creatinine: 1 mg/dL (ref 0.7–1.3)
Glucose: 114 mg/dl (ref 70–140)
POTASSIUM: 4.3 meq/L (ref 3.5–5.1)
SODIUM: 139 meq/L (ref 136–145)
TOTAL PROTEIN: 7.3 g/dL (ref 6.4–8.3)
Total Bilirubin: 0.6 mg/dL (ref 0.20–1.20)

## 2013-09-01 MED ORDER — IOHEXOL 300 MG/ML  SOLN
100.0000 mL | Freq: Once | INTRAMUSCULAR | Status: AC | PRN
  Administered 2013-09-01: 100 mL via INTRAVENOUS

## 2013-09-02 LAB — CANCER ANTIGEN 19-9: CA 19 9: 297.4 U/mL — AB (ref <35.0)

## 2013-09-03 ENCOUNTER — Ambulatory Visit (HOSPITAL_BASED_OUTPATIENT_CLINIC_OR_DEPARTMENT_OTHER): Payer: Federal, State, Local not specified - PPO | Admitting: Oncology

## 2013-09-03 ENCOUNTER — Telehealth: Payer: Self-pay | Admitting: Oncology

## 2013-09-03 VITALS — BP 127/66 | HR 60 | Temp 98.1°F | Resp 18 | Ht 69.0 in | Wt 190.2 lb

## 2013-09-03 DIAGNOSIS — C762 Malignant neoplasm of abdomen: Secondary | ICD-10-CM

## 2013-09-03 DIAGNOSIS — G620 Drug-induced polyneuropathy: Secondary | ICD-10-CM

## 2013-09-03 DIAGNOSIS — G893 Neoplasm related pain (acute) (chronic): Secondary | ICD-10-CM

## 2013-09-03 DIAGNOSIS — C772 Secondary and unspecified malignant neoplasm of intra-abdominal lymph nodes: Secondary | ICD-10-CM

## 2013-09-03 DIAGNOSIS — C801 Malignant (primary) neoplasm, unspecified: Secondary | ICD-10-CM

## 2013-09-03 MED ORDER — OXYCODONE HCL 10 MG PO TABS: 10.0000 mg | ORAL_TABLET | ORAL | Status: DC | PRN

## 2013-09-03 NOTE — Telephone Encounter (Signed)
gv adn printed appt sched adn avs for pt for April °

## 2013-09-03 NOTE — Patient Instructions (Signed)
Irinotecan injection What is this medicine? IRINOTECAN (ir in oh TEE kan ) is a chemotherapy drug. It is used to treat colon and rectal cancer. This medicine may be used for other purposes; ask your health care provider or pharmacist if you have questions. COMMON BRAND NAME(S): Camptosar What should I tell my health care provider before I take this medicine? They need to know if you have any of these conditions: -blood disorders -dehydration -diarrhea -infection (especially a virus infection such as chickenpox, cold sores, or herpes) -liver disease -low blood counts, like low white cell, platelet, or red cell counts -recent or ongoing radiation therapy -an unusual or allergic reaction to irinotecan, sorbitol, other chemotherapy, other medicines, foods, dyes, or preservatives -pregnant or trying to get pregnant -breast-feeding How should I use this medicine? This drug is given as an infusion into a vein. It is administered in a hospital or clinic by a specially trained health care professional. Talk to your pediatrician regarding the use of this medicine in children. Special care may be needed. Overdosage: If you think you have taken too much of this medicine contact a poison control center or emergency room at once. NOTE: This medicine is only for you. Do not share this medicine with others. What if I miss a dose? It is important not to miss your dose. Call your doctor or health care professional if you are unable to keep an appointment. What may interact with this medicine? Do not take this medicine with any of the following medications: -atazanavir -ketoconazole -St. John's Wort This medicine may also interact with the following medications: -dexamethasone -diuretics -laxatives -medicines for seizures like carbamazepine, mephobarbital, phenobarbital, phenytoin, primidone -medicines to increase blood counts like filgrastim, pegfilgrastim,  sargramostim -prochlorperazine -vaccines This list may not describe all possible interactions. Give your health care provider a list of all the medicines, herbs, non-prescription drugs, or dietary supplements you use. Also tell them if you smoke, drink alcohol, or use illegal drugs. Some items may interact with your medicine. What should I watch for while using this medicine? Your condition will be monitored carefully while you are receiving this medicine. You will need important blood work done while you are taking this medicine. This drug may make you feel generally unwell. This is not uncommon, as chemotherapy can affect healthy cells as well as cancer cells. Report any side effects. Continue your course of treatment even though you feel ill unless your doctor tells you to stop. In some cases, you may be given additional medicines to help with side effects. Follow all directions for their use. You may get drowsy or dizzy. Do not drive, use machinery, or do anything that needs mental alertness until you know how this medicine affects you. Do not stand or sit up quickly, especially if you are an older patient. This reduces the risk of dizzy or fainting spells. Call your doctor or health care professional for advice if you get a fever, chills or sore throat, or other symptoms of a cold or flu. Do not treat yourself. This drug decreases your body's ability to fight infections. Try to avoid being around people who are sick. This medicine may increase your risk to bruise or bleed. Call your doctor or health care professional if you notice any unusual bleeding. Be careful brushing and flossing your teeth or using a toothpick because you may get an infection or bleed more easily. If you have any dental work done, tell your dentist you are receiving this medicine. Avoid  professional if you notice any unusual bleeding.  Be careful brushing and flossing your teeth or using a toothpick because you may get an infection or bleed more easily. If you have any dental work done, tell your dentist you are receiving this medicine.  Avoid taking products that contain aspirin, acetaminophen, ibuprofen, naproxen, or ketoprofen unless instructed by your doctor. These medicines may  hide a fever.  Do not become pregnant while taking this medicine. Women should inform their doctor if they wish to become pregnant or think they might be pregnant. There is a potential for serious side effects to an unborn child. Talk to your health care professional or pharmacist for more information. Do not breast-feed an infant while taking this medicine.  What side effects may I notice from receiving this medicine?  Side effects that you should report to your doctor or health care professional as soon as possible:  -allergic reactions like skin rash, itching or hives, swelling of the face, lips, or tongue  -low blood counts - this medicine may decrease the number of white blood cells, red blood cells and platelets. You may be at increased risk for infections and bleeding.  -signs of infection - fever or chills, cough, sore throat, pain or difficulty passing urine  -signs of decreased platelets or bleeding - bruising, pinpoint red spots on the skin, black, tarry stools, blood in the urine  -signs of decreased red blood cells - unusually weak or tired, fainting spells, lightheadedness  -breathing problems  -chest pain  -diarrhea  -feeling faint or lightheaded, falls  -flushing, runny nose, sweating during infusion  -mouth sores or pain  -pain, swelling, redness or irritation where injected  -pain, swelling, warmth in the leg  -pain, tingling, numbness in the hands or feet  -problems with balance, talking, walking  -stomach cramps, pain  -trouble passing urine or change in the amount of urine  -vomiting as to be unable to hold down drinks or food  -yellowing of the eyes or skin  Side effects that usually do not require medical attention (report to your doctor or health care professional if they continue or are bothersome):  -constipation  -hair loss  -headache  -loss of appetite  -nausea, vomiting  -stomach upset  This list may not describe all possible side effects. Call your doctor for medical advice about side  effects. You may report side effects to FDA at 1-800-FDA-1088.  Where should I keep my medicine?  This drug is given in a hospital or clinic and will not be stored at home.  NOTE: This sheet is a summary. It may not cover all possible information. If you have questions about this medicine, talk to your doctor, pharmacist, or health care provider.   2014, [employer]vier/Gold Standard. (2007-11-05 16:29:12)

## 2013-09-03 NOTE — Progress Notes (Signed)
Frank Baird    OFFICE PROGRESS NOTE   INTERVAL HISTORY:   Frank Baird returns as scheduled. He reports decreased nausea with the lower dose of Xeloda. No diarrhea. Stable back pain. He has noted increased abdominal pain.  Objective:  Vital signs in last 24 hours:  Blood pressure 127/66, pulse 60, temperature 98.1 F (36.7 C), temperature source Oral, resp. rate 18, height 5\' 9"  (1.753 m), weight 190 lb 3.2 oz (86.274 kg), SpO2 98.00%.    HEENT: no thrush or ulcers Lymphatics: no cervical or supraclavicular nodes Resp: lungs clear bilaterally Cardio: regular rate and rhythm with premature beat GI: no hepatomegaly, no apparent ascites, tender in the right mid abdomen, no mass Vascular: no leg edema Skin:palms without erythema or skin breakdown   Portacath/PICC-without erythema  Lab Results:  Lab Results  Component Value Date   WBC 7.6 09/01/2013   HGB 14.5 09/01/2013   HCT 42.8 09/01/2013   MCV 96.0 09/01/2013   PLT 191 09/01/2013   NEUTROABS 3.9 09/01/2013   CA19-9 on 09/01/2013-297.4  X-rays: CTs of the chest, abdomen, and pelvis on 09/01/2013, compared to 03/07/2013:a subcarinal node has resolved, no hilar adenopathy, a periesophageal node measures 1.2 cm and previous measured 1.6 cm.normal liver, 7 mm hepatoduodenal node previously measured 1 cm.increased nodularity in the omentum, new nodule adjacent to the left lobe of the liver.increased perihepatic ascites and new trace pelvic fluid the  Medications: I have reviewed the patient's current medications.  Assessment/Plan: 1. 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 CT of the chest 11/18/2012 with borderline enlarged mediastinal nodes, gastrohepatic ligament nodes and omental/peritoneal nodularity. Negative upper endoscopy. Normal CEA on 12/13/2012. Status post  cycle 1 FOLFOX 12/11/2012. Status post cycle 2 FOLFOX 12/25/2012. Status post cycle 3 on 01/15/2013. Cycle 4 on 01/29/2013; cycle 5 on 02/12/2013; cycle 6 on 02/26/2013. Restaging CT evaluation showed improvement. He completed cycle 8 on 03/26/2013. Oxaliplatin was discontinued following cycle 8 due to progressive neuropathy symptoms. He began maintenance Xeloda 04/24/2013. Xeloda dose reduced beginning with cycle 5 on 07/26/2013.  CA 19-9 increased at 297.4 on 09/01/2013 CTs 09/01/2013 with decreased chest and abdomen lymphadenopathy, increased omental nodularity 2. Cecal/appendix mass noted at the time of exploratory laparotomy 11/13/2012. 3. COPD. 4. Pain secondary to carcinomatosis. 5. Chronic back pain. Stable.  6. Status post Port-A-Cath placement 12/02/2012. 7. Delayed nausea following cycle 1 FOLFOX. Aloxi was added with cycle 2. He had delayed nausea following cycle 2. Emend was added beginning with cycle 3. Prophylactic Decadron added beginning with cycle 8. He did not take as prescribed. He again had delayed nausea. 8. Diarrhea following cycle 2 FOLFOX. Question related to 5-fluorouracil. The diarrhea improved with Lomotil. The 5-fluorouracil bolus was eliminated and the dose of the 5 fluorouracil infusion was reduced beginning with cycle 3.  9. Left mid back pain/tenderness just lateral to the spine. Question etiology. Duragesic patch increased to 75 mcg every 3 days. He takes oxycodone as needed. He continues to have back pain. 10. Anorexia/weight loss. He began a trial of Megace following office visit 01/08/2013. Appetite is better. No longer taking Megace.  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 Increased with moderate loss of vibratory sense at the fingertips per tuning fork exam and interfering with activity when here 04/09/2013. Oxaliplatin was discontinued. 12. Nausea/vomiting with cycle 1  Xeloda. He has  persistent nausea while on Xeloda. Improved with dose reduction beginning with cycle 5.   Disposition:  His overall performance status appears unchanged, but the CA 19-9 is higher. The restaging CT shows increased omental nodularity. I discussed the restaging CT findings and treatment options with Frank Baird.we discussed observation versus salvage chemotherapy. We specifically discussed FOLFIRI. I reviewed the potential toxicities associated with irinotecan including the chance for nausea/vomiting, mucositis, diarrhea, alopecia, and hematologic toxicity. We discussed the cramping abdominal pain sometimes seen with irinotecan.  I also reviewed the potential toxicities associated with Avastin.  He does not wish to receive Avastin.  Frank Baird will discuss the FOLFIRI option with his wife and contact us with his decision on treatment within the next few days. He will be scheduled for an office visit on 10/01/2013.   Betsy Coder, MD  09/03/2013  6:15 PM

## 2013-09-04 ENCOUNTER — Other Ambulatory Visit: Payer: Self-pay | Admitting: Oncology

## 2013-09-04 ENCOUNTER — Telehealth: Payer: Self-pay | Admitting: *Deleted

## 2013-09-04 DIAGNOSIS — C801 Malignant (primary) neoplasm, unspecified: Secondary | ICD-10-CM | POA: Insufficient documentation

## 2013-09-04 NOTE — Telephone Encounter (Signed)
Message from pt, he and his wife have decided to proceed with chemo. Will be back in town Wed. Reviewed with Dr. Benay Spice: Schedule for FOLFIRI. Left message on voicemail informing pt of appt 3/12 @ 12PM.

## 2013-09-05 ENCOUNTER — Other Ambulatory Visit: Payer: Self-pay | Admitting: *Deleted

## 2013-09-05 DIAGNOSIS — C801 Malignant (primary) neoplasm, unspecified: Secondary | ICD-10-CM

## 2013-09-11 ENCOUNTER — Ambulatory Visit (HOSPITAL_BASED_OUTPATIENT_CLINIC_OR_DEPARTMENT_OTHER): Payer: Federal, State, Local not specified - PPO

## 2013-09-11 DIAGNOSIS — C772 Secondary and unspecified malignant neoplasm of intra-abdominal lymph nodes: Secondary | ICD-10-CM

## 2013-09-11 DIAGNOSIS — C801 Malignant (primary) neoplasm, unspecified: Secondary | ICD-10-CM

## 2013-09-11 DIAGNOSIS — Z5111 Encounter for antineoplastic chemotherapy: Secondary | ICD-10-CM

## 2013-09-11 MED ORDER — PALONOSETRON HCL INJECTION 0.25 MG/5ML
0.2500 mg | Freq: Once | INTRAVENOUS | Status: AC
  Administered 2013-09-11: 0.25 mg via INTRAVENOUS

## 2013-09-11 MED ORDER — IRINOTECAN HCL CHEMO INJECTION 100 MG/5ML
180.0000 mg/m2 | Freq: Once | INTRAVENOUS | Status: AC
  Administered 2013-09-11: 370 mg via INTRAVENOUS
  Filled 2013-09-11: qty 18.5

## 2013-09-11 MED ORDER — SODIUM CHLORIDE 0.9 % IV SOLN
Freq: Once | INTRAVENOUS | Status: AC
  Administered 2013-09-11: 11:00:00 via INTRAVENOUS

## 2013-09-11 MED ORDER — DEXAMETHASONE SODIUM PHOSPHATE 20 MG/5ML IJ SOLN
INTRAMUSCULAR | Status: AC
  Filled 2013-09-11: qty 5

## 2013-09-11 MED ORDER — PALONOSETRON HCL INJECTION 0.25 MG/5ML
INTRAVENOUS | Status: AC
  Filled 2013-09-11: qty 5

## 2013-09-11 MED ORDER — LEUCOVORIN CALCIUM INJECTION 350 MG
850.0000 mg | Freq: Once | INTRAVENOUS | Status: AC
  Administered 2013-09-11: 850 mg via INTRAVENOUS
  Filled 2013-09-11: qty 42.5

## 2013-09-11 MED ORDER — FLUOROURACIL CHEMO INJECTION 5 GM/100ML
1970.0000 mg/m2 | INTRAVENOUS | Status: DC
  Administered 2013-09-11: 4050 mg via INTRAVENOUS
  Filled 2013-09-11: qty 81

## 2013-09-11 MED ORDER — DEXAMETHASONE SODIUM PHOSPHATE 20 MG/5ML IJ SOLN
12.0000 mg | Freq: Once | INTRAMUSCULAR | Status: AC
  Administered 2013-09-11: 12 mg via INTRAVENOUS

## 2013-09-11 MED ORDER — SODIUM CHLORIDE 0.9 % IV SOLN
150.0000 mg | Freq: Once | INTRAVENOUS | Status: AC
  Administered 2013-09-11: 150 mg via INTRAVENOUS
  Filled 2013-09-11: qty 5

## 2013-09-11 NOTE — Patient Instructions (Signed)
[facility] Discharge Instructions for Patients Receiving Chemotherapy  Today you received the following chemotherapy agents:   camptosar  To help prevent nausea and vomiting after your treatment, we encourage you to take your nausea medication   If you develop nausea and vomiting that is not controlled by your nausea medication, call the clinic.   BELOW ARE SYMPTOMS THAT SHOULD BE REPORTED IMMEDIATELY:  *FEVER GREATER THAN 100.5 F  *CHILLS WITH OR WITHOUT FEVER  NAUSEA AND VOMITING THAT IS NOT CONTROLLED WITH YOUR NAUSEA MEDICATION  *UNUSUAL SHORTNESS OF BREATH  *UNUSUAL BRUISING OR BLEEDING  TENDERNESS IN MOUTH AND THROAT WITH OR WITHOUT PRESENCE OF ULCERS  *URINARY PROBLEMS  *BOWEL PROBLEMS  UNUSUAL RASH Items with * indicate a potential emergency and should be followed up as soon as possible.  Feel free to call the clinic you have any questions or concerns. The clinic phone number is [phone].

## 2013-09-11 NOTE — Progress Notes (Signed)
Pt states he has spill kit at home.  Instructions given on CADD pump.  Pt upset & angry.  States MD didn't tell him that this was a 1 & 1/2 hour infusion & thought he would get something quick & go home.  He states this has messed up his day.  Written info given on camptosar.& instructions for diarrhea.  Left message on phone to pick up imodium  AD & to take 2 to start with if cramping or diarrhea & take 1 q 2hours & can take 2 q 4 hours at night & cont until no diarrhea for 12 hours.  Pt reports that he has lomotil at home but also has imodium AD & will try if he needs it.

## 2013-09-12 ENCOUNTER — Other Ambulatory Visit: Payer: Self-pay | Admitting: Oncology

## 2013-09-12 ENCOUNTER — Telehealth: Payer: Self-pay | Admitting: *Deleted

## 2013-09-12 NOTE — Telephone Encounter (Signed)
[facility] for chemotherapy F/U.  Patient states he is feeling "Fair" or doing well considering what I'm dealing with".  Denies n/v.  Denies any new side effects or symptoms.  Bowel and bladder is functioning well.  Able to tell me about immodium AD and other trips to [place] bowels functioning as close to 'normal'.  Eating and drinking well and I instructed to drink 64 oz minimum daily or at least the day before, of and after treatment.  Denies questions at this time and encouraged to call if needed.  Reviewed how to call after hours using Call a Nurse in the case of an emergency.

## 2013-09-12 NOTE — Telephone Encounter (Signed)
Message copied by Cherylynn Ridges on Fri Sep 12, 2013  4:01 PM ------      Message from: Jesse Fall      Created: Thu Sep 11, 2013  6:15 PM      Regarding: Chemo f/u       1st camptosar.  received FOLFIRI, had folfox before. no reaction,  Pt angry while here b/c he thought treatment would be quicker. States upset with MD. ------

## 2013-09-13 ENCOUNTER — Ambulatory Visit (HOSPITAL_BASED_OUTPATIENT_CLINIC_OR_DEPARTMENT_OTHER): Payer: Federal, State, Local not specified - PPO

## 2013-09-13 VITALS — BP 143/69 | HR 48 | Temp 98.9°F

## 2013-09-13 DIAGNOSIS — C801 Malignant (primary) neoplasm, unspecified: Secondary | ICD-10-CM

## 2013-09-13 DIAGNOSIS — C772 Secondary and unspecified malignant neoplasm of intra-abdominal lymph nodes: Secondary | ICD-10-CM

## 2013-09-13 MED ORDER — SODIUM CHLORIDE 0.9 % IJ SOLN
10.0000 mL | INTRAMUSCULAR | Status: DC | PRN
  Administered 2013-09-13: 10 mL
  Filled 2013-09-13: qty 10

## 2013-09-13 MED ORDER — HEPARIN SOD (PORK) LOCK FLUSH 100 UNIT/ML IV SOLN
500.0000 [IU] | Freq: Once | INTRAVENOUS | Status: AC | PRN
  Administered 2013-09-13: 500 [IU]
  Filled 2013-09-13: qty 5

## 2013-09-23 ENCOUNTER — Other Ambulatory Visit: Payer: Self-pay | Admitting: *Deleted

## 2013-09-23 DIAGNOSIS — C801 Malignant (primary) neoplasm, unspecified: Secondary | ICD-10-CM

## 2013-09-23 DIAGNOSIS — C762 Malignant neoplasm of abdomen: Secondary | ICD-10-CM

## 2013-09-23 MED ORDER — LORAZEPAM 1 MG PO TABS: 1.0000 mg | ORAL_TABLET | Freq: Four times a day (QID) | ORAL | Status: DC | PRN

## 2013-09-23 MED ORDER — FENTANYL 75 MCG/HR TD PT72: 75.0000 ug | MEDICATED_PATCH | TRANSDERMAL | Status: DC

## 2013-09-23 NOTE — Telephone Encounter (Signed)
Left VM requesting refill on Fentanyl and Ativan to pick up.

## 2013-09-23 NOTE — Telephone Encounter (Signed)
Notified patient that scripts are ready for pick up

## 2013-10-01 ENCOUNTER — Telehealth: Payer: Self-pay | Admitting: Oncology

## 2013-10-01 ENCOUNTER — Ambulatory Visit (HOSPITAL_BASED_OUTPATIENT_CLINIC_OR_DEPARTMENT_OTHER): Payer: Federal, State, Local not specified - PPO

## 2013-10-01 ENCOUNTER — Ambulatory Visit (HOSPITAL_BASED_OUTPATIENT_CLINIC_OR_DEPARTMENT_OTHER): Payer: Federal, State, Local not specified - PPO | Admitting: Oncology

## 2013-10-01 VITALS — BP 132/62 | HR 67 | Temp 98.0°F | Resp 18 | Ht 69.0 in | Wt 186.6 lb

## 2013-10-01 DIAGNOSIS — G8929 Other chronic pain: Secondary | ICD-10-CM

## 2013-10-01 DIAGNOSIS — C801 Malignant (primary) neoplasm, unspecified: Secondary | ICD-10-CM

## 2013-10-01 DIAGNOSIS — C779 Secondary and unspecified malignant neoplasm of lymph node, unspecified: Secondary | ICD-10-CM

## 2013-10-01 DIAGNOSIS — M549 Dorsalgia, unspecified: Secondary | ICD-10-CM

## 2013-10-01 DIAGNOSIS — C762 Malignant neoplasm of abdomen: Secondary | ICD-10-CM

## 2013-10-01 DIAGNOSIS — R110 Nausea: Secondary | ICD-10-CM

## 2013-10-01 DIAGNOSIS — C50919 Malignant neoplasm of unspecified site of unspecified female breast: Secondary | ICD-10-CM

## 2013-10-01 DIAGNOSIS — G622 Polyneuropathy due to other toxic agents: Secondary | ICD-10-CM

## 2013-10-01 DIAGNOSIS — T451X5A Adverse effect of antineoplastic and immunosuppressive drugs, initial encounter: Secondary | ICD-10-CM

## 2013-10-01 DIAGNOSIS — R197 Diarrhea, unspecified: Secondary | ICD-10-CM

## 2013-10-01 LAB — CBC WITH DIFFERENTIAL/PLATELET
BASO%: 0.9 % (ref 0.0–2.0)
Basophils Absolute: 0.1 10*3/uL (ref 0.0–0.1)
EOS%: 3.4 % (ref 0.0–7.0)
Eosinophils Absolute: 0.2 10*3/uL (ref 0.0–0.5)
HCT: 42.7 % (ref 38.4–49.9)
HGB: 14.7 g/dL (ref 13.0–17.1)
LYMPH%: 44.3 % (ref 14.0–49.0)
MCH: 32.2 pg (ref 27.2–33.4)
MCHC: 34.4 g/dL (ref 32.0–36.0)
MCV: 93.6 fL (ref 79.3–98.0)
MONO#: 0.6 10*3/uL (ref 0.1–0.9)
MONO%: 9.9 % (ref 0.0–14.0)
NEUT#: 2.3 10*3/uL (ref 1.5–6.5)
NEUT%: 41.5 % (ref 39.0–75.0)
PLATELETS: 173 10*3/uL (ref 140–400)
RBC: 4.56 10*6/uL (ref 4.20–5.82)
RDW: 14 % (ref 11.0–14.6)
WBC: 5.6 10*3/uL (ref 4.0–10.3)
lymph#: 2.5 10*3/uL (ref 0.9–3.3)
nRBC: 0 % (ref 0–0)

## 2013-10-01 MED ORDER — OXYCODONE HCL 10 MG PO TABS: 10.0000 mg | ORAL_TABLET | ORAL | Status: DC | PRN

## 2013-10-01 NOTE — Telephone Encounter (Signed)
gv adn printed aptp sched and avs for pt for April....sed added tx....sent pt to lab

## 2013-10-01 NOTE — Progress Notes (Signed)
Frank Baird OFFICE PROGRESS NOTE   Diagnosis: metastatic adenocarcinoma with carcinomatosis  INTERVAL HISTORY:   Frank Baird completed a cycle of FOLFIRI 09/12/2011. He reports the chemotherapy was complicated by prolonged malaise. He also had nausea lasting for one week following chemotherapy. No emesis. The nausea was relieved with Zofran. He has stable abdominal pain.  Objective:  Vital signs in last 24 hours:  Blood pressure 132/62, pulse 67, temperature 98 F (36.7 C), temperature source Oral, resp. rate 18, height 5\' 9"  (1.753 m), weight 186 lb 9.6 oz (84.641 kg), SpO2 99.00%.    HEENT: no thrush or ulcers Resp: lungs clear bilaterally Cardio: regular rate and rhythm GI: no hepatomegaly, no mass, tender in the right mid abdomen and at the left iliac. Vascular: no leg edema   Portacath/PICC-without erythema  Lab Results:  Lab Results  Component Value Date   WBC 5.6 10/01/2013   HGB 14.7 10/01/2013   HCT 42.7 10/01/2013   MCV 93.6 10/01/2013   PLT 173 10/01/2013   NEUTROABS 2.3 10/01/2013       Medications: I have reviewed the patient's current medications.  Assessment/Plan: 1. 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 CT of the chest 11/18/2012 with borderline enlarged mediastinal nodes, gastrohepatic ligament nodes and omental/peritoneal nodularity. Negative upper endoscopy. Normal CEA on 12/13/2012. Status post cycle 1 FOLFOX 12/11/2012. Status post cycle 2 FOLFOX 12/25/2012. Status post cycle 3 on 01/15/2013. Cycle 4 on 01/29/2013; cycle 5 on 02/12/2013; cycle 6 on 02/26/2013. Restaging CT evaluation showed improvement. He completed cycle 8 on 03/26/2013. Oxaliplatin was discontinued following cycle 8 due to progressive neuropathy symptoms. He began maintenance Xeloda 04/24/2013. Xeloda dose reduced beginning  with cycle 5 on 07/26/2013.  CA 19-9 increased at 297.4 on 09/01/2013  CTs 09/01/2013 with decreased chest and abdomen lymphadenopathy, increased omental nodularity Cycle 1 of FOLFIRI 09/11/2013 2. Cecal/appendix mass noted at the time of exploratory laparotomy 11/13/2012. 3. COPD. 4. Pain secondary to carcinomatosis. 5. Chronic back pain. Stable.  6. Status post Port-A-Cath placement 12/02/2012. 7. Delayed nausea following cycle 1 FOLFOX. Aloxi was added with cycle 2. He had delayed nausea following cycle 2. Emend was added beginning with cycle 3. Prophylactic Decadron added beginning with cycle 8. He did not take as prescribed. He again had delayed nausea. 8. Diarrhea following cycle 2 FOLFOX. Question related to 5-fluorouracil. The diarrhea improved with Lomotil. The 5-fluorouracil bolus was eliminated and the dose of the 5 fluorouracil infusion was reduced beginning with cycle 3.  9. Left mid back pain/tenderness just lateral to the spine. Question etiology. Duragesic patch increased to 75 mcg every 3 days. He takes oxycodone as needed. He continues to have back pain. 10. Anorexia/weight loss. He began a trial of Megace following office visit 01/08/2013. Appetite is better. No longer taking Megace.  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 Increased with moderate loss of vibratory sense at the fingertips per tuning fork exam and interfering with activity when here 04/09/2013. Oxaliplatin was discontinued. 12. Nausea/vomiting with cycle 1 Xeloda. He has persistent nausea while on Xeloda. Improved with dose reduction beginning with cycle 5.   Disposition:  Frank Baird appears stable. He completed one cycle of FOLFIRI. He does not wish to receive cycle 2 today. He plans to vacation beginning on 10/04/2013. He will be gone for 2 weeks. Frank Baird will be scheduled  for an office visit and cycle 2 FOLFIRI  10/20/2013. We refilled his prescription for oxycodone. I encouraged him to ambulate frequently while traveling.  Betsy Coder, MD  10/01/2013  3:17 PM

## 2013-10-02 MED ORDER — ONDANSETRON HCL 8 MG PO TABS
ORAL_TABLET | ORAL | Status: AC
  Filled 2013-10-02: qty 1

## 2013-10-02 MED ORDER — DEXAMETHASONE 4 MG PO TABS
ORAL_TABLET | ORAL | Status: AC
  Filled 2013-10-02: qty 5

## 2013-10-07 ENCOUNTER — Other Ambulatory Visit: Payer: Self-pay | Admitting: *Deleted

## 2013-10-07 NOTE — Progress Notes (Unsigned)
Patient called to report his vacation has been cancelled and wants to get his 2nd FOLFIRI in this week on 4/8 or 4/9. Prefers am appointment. POF to scheduler for L/chemo as requested.

## 2013-10-08 ENCOUNTER — Telehealth: Payer: Self-pay | Admitting: Oncology

## 2013-10-08 NOTE — Telephone Encounter (Signed)
per dtr Levada Dy [phone]) cx appt per pt - per [facility] when pt last seen by JG he checked out fine and does not wilsh to have any further appts. message to NG.

## 2013-10-09 ENCOUNTER — Ambulatory Visit (HOSPITAL_BASED_OUTPATIENT_CLINIC_OR_DEPARTMENT_OTHER): Payer: Federal, State, Local not specified - PPO

## 2013-10-09 ENCOUNTER — Other Ambulatory Visit: Payer: Self-pay | Admitting: *Deleted

## 2013-10-09 ENCOUNTER — Other Ambulatory Visit: Payer: Self-pay | Admitting: Oncology

## 2013-10-09 ENCOUNTER — Other Ambulatory Visit (HOSPITAL_BASED_OUTPATIENT_CLINIC_OR_DEPARTMENT_OTHER): Payer: Federal, State, Local not specified - PPO

## 2013-10-09 VITALS — BP 159/77 | HR 64 | Temp 98.5°F

## 2013-10-09 DIAGNOSIS — C786 Secondary malignant neoplasm of retroperitoneum and peritoneum: Secondary | ICD-10-CM

## 2013-10-09 DIAGNOSIS — C801 Malignant (primary) neoplasm, unspecified: Secondary | ICD-10-CM

## 2013-10-09 DIAGNOSIS — C762 Malignant neoplasm of abdomen: Secondary | ICD-10-CM

## 2013-10-09 DIAGNOSIS — Z5111 Encounter for antineoplastic chemotherapy: Secondary | ICD-10-CM

## 2013-10-09 LAB — COMPREHENSIVE METABOLIC PANEL (CC13)
ALK PHOS: 60 U/L (ref 40–150)
ALT: 6 U/L (ref 0–55)
AST: 14 U/L (ref 5–34)
Albumin: 3.9 g/dL (ref 3.5–5.0)
Anion Gap: 8 mEq/L (ref 3–11)
BUN: 7.7 mg/dL (ref 7.0–26.0)
CO2: 28 mEq/L (ref 22–29)
Calcium: 9.6 mg/dL (ref 8.4–10.4)
Chloride: 105 mEq/L (ref 98–109)
Creatinine: 1 mg/dL (ref 0.7–1.3)
Glucose: 121 mg/dl (ref 70–140)
Potassium: 4.2 mEq/L (ref 3.5–5.1)
Sodium: 141 mEq/L (ref 136–145)
Total Bilirubin: 0.49 mg/dL (ref 0.20–1.20)
Total Protein: 7.1 g/dL (ref 6.4–8.3)

## 2013-10-09 LAB — CBC WITH DIFFERENTIAL/PLATELET
BASO%: 0.8 % (ref 0.0–2.0)
BASOS ABS: 0.1 10*3/uL (ref 0.0–0.1)
EOS%: 3.7 % (ref 0.0–7.0)
Eosinophils Absolute: 0.3 10*3/uL (ref 0.0–0.5)
HEMATOCRIT: 43 % (ref 38.4–49.9)
HGB: 14.6 g/dL (ref 13.0–17.1)
LYMPH%: 25.6 % (ref 14.0–49.0)
MCH: 31.8 pg (ref 27.2–33.4)
MCHC: 34.1 g/dL (ref 32.0–36.0)
MCV: 93.5 fL (ref 79.3–98.0)
MONO#: 0.6 10*3/uL (ref 0.1–0.9)
MONO%: 8.7 % (ref 0.0–14.0)
NEUT#: 4.5 10*3/uL (ref 1.5–6.5)
NEUT%: 61.2 % (ref 39.0–75.0)
Platelets: 206 10*3/uL (ref 140–400)
RBC: 4.6 10*6/uL (ref 4.20–5.82)
RDW: 14.3 % (ref 11.0–14.6)
WBC: 7.3 10*3/uL (ref 4.0–10.3)
lymph#: 1.9 10*3/uL (ref 0.9–3.3)

## 2013-10-09 MED ORDER — SODIUM CHLORIDE 0.9 % IV SOLN
150.0000 mg | Freq: Once | INTRAVENOUS | Status: AC
  Administered 2013-10-09: 150 mg via INTRAVENOUS
  Filled 2013-10-09: qty 5

## 2013-10-09 MED ORDER — PALONOSETRON HCL INJECTION 0.25 MG/5ML
INTRAVENOUS | Status: AC
  Filled 2013-10-09: qty 5

## 2013-10-09 MED ORDER — DEXAMETHASONE SODIUM PHOSPHATE 20 MG/5ML IJ SOLN
INTRAMUSCULAR | Status: AC
  Filled 2013-10-09: qty 5

## 2013-10-09 MED ORDER — LEUCOVORIN CALCIUM INJECTION 350 MG
415.0000 mg/m2 | Freq: Once | INTRAMUSCULAR | Status: AC
  Administered 2013-10-09: 850 mg via INTRAVENOUS
  Filled 2013-10-09: qty 42.5

## 2013-10-09 MED ORDER — SODIUM CHLORIDE 0.9 % IJ SOLN
10.0000 mL | INTRAMUSCULAR | Status: DC | PRN
  Filled 2013-10-09: qty 10

## 2013-10-09 MED ORDER — FLUOROURACIL CHEMO INJECTION 5 GM/100ML
1970.0000 mg/m2 | INTRAVENOUS | Status: DC
  Administered 2013-10-09: 4050 mg via INTRAVENOUS
  Filled 2013-10-09: qty 81

## 2013-10-09 MED ORDER — DEXAMETHASONE SODIUM PHOSPHATE 20 MG/5ML IJ SOLN
12.0000 mg | Freq: Once | INTRAMUSCULAR | Status: AC
  Administered 2013-10-09: 12 mg via INTRAVENOUS

## 2013-10-09 MED ORDER — HEPARIN SOD (PORK) LOCK FLUSH 100 UNIT/ML IV SOLN
500.0000 [IU] | Freq: Once | INTRAVENOUS | Status: DC | PRN
  Filled 2013-10-09: qty 5

## 2013-10-09 MED ORDER — LIDOCAINE-PRILOCAINE 2.5-2.5 % EX CREA: TOPICAL_CREAM | CUTANEOUS | Status: AC

## 2013-10-09 MED ORDER — IRINOTECAN HCL CHEMO INJECTION 100 MG/5ML
180.0000 mg/m2 | Freq: Once | INTRAVENOUS | Status: AC
  Administered 2013-10-09: 370 mg via INTRAVENOUS
  Filled 2013-10-09: qty 18.5

## 2013-10-09 MED ORDER — PALONOSETRON HCL INJECTION 0.25 MG/5ML
0.2500 mg | Freq: Once | INTRAVENOUS | Status: AC
  Administered 2013-10-09: 0.25 mg via INTRAVENOUS

## 2013-10-09 MED ORDER — SODIUM CHLORIDE 0.9 % IV SOLN
Freq: Once | INTRAVENOUS | Status: AC
  Administered 2013-10-09: 10:00:00 via INTRAVENOUS

## 2013-10-09 NOTE — Patient Instructions (Signed)
[facility] Discharge Instructions for Patients Receiving Chemotherapy  Today you received the following chemotherapy agents Camptosar (CPT-11/Irinotecan), Leucovorin and 5FU  To help prevent nausea and vomiting after your treatment, we encourage you to take your nausea medication zofran 8 mg every 8 hrs and reglan 10 mg before meals and at bedtime as ordered by Dr. Benay Spice.   If you develop nausea and vomiting that is not controlled by your nausea medication, call the clinic.   BELOW ARE SYMPTOMS THAT SHOULD BE REPORTED IMMEDIATELY:  *FEVER GREATER THAN 100.5 F  *CHILLS WITH OR WITHOUT FEVER  NAUSEA AND VOMITING THAT IS NOT CONTROLLED WITH YOUR NAUSEA MEDICATION  *UNUSUAL SHORTNESS OF BREATH  *UNUSUAL BRUISING OR BLEEDING  TENDERNESS IN MOUTH AND THROAT WITH OR WITHOUT PRESENCE OF ULCERS  *URINARY PROBLEMS  *BOWEL PROBLEMS  UNUSUAL RASH Items with * indicate a potential emergency and should be followed up as soon as possible.  Feel free to call the clinic you have any questions or concerns. The clinic phone number is [phone].

## 2013-10-09 NOTE — Progress Notes (Signed)
Dr. Benay Spice given today's cbc results.  Asked for orders to treat today.  CMET 09-29-2013 wnl.  Now note today's CMET wnl as well.

## 2013-10-09 NOTE — Progress Notes (Signed)
Discharged at 1307, ambulatory in no distress

## 2013-10-11 ENCOUNTER — Ambulatory Visit (HOSPITAL_BASED_OUTPATIENT_CLINIC_OR_DEPARTMENT_OTHER): Payer: Federal, State, Local not specified - PPO

## 2013-10-11 VITALS — BP 144/64 | HR 60 | Temp 98.0°F

## 2013-10-11 DIAGNOSIS — C801 Malignant (primary) neoplasm, unspecified: Secondary | ICD-10-CM

## 2013-10-11 MED ORDER — HEPARIN SOD (PORK) LOCK FLUSH 100 UNIT/ML IV SOLN
500.0000 [IU] | Freq: Once | INTRAVENOUS | Status: AC | PRN
  Administered 2013-10-11: 500 [IU]
  Filled 2013-10-11: qty 5

## 2013-10-11 MED ORDER — SODIUM CHLORIDE 0.9 % IJ SOLN
10.0000 mL | INTRAMUSCULAR | Status: DC | PRN
  Administered 2013-10-11: 10 mL
  Filled 2013-10-11: qty 10

## 2013-10-11 NOTE — Patient Instructions (Signed)

## 2013-10-17 ENCOUNTER — Telehealth: Payer: Self-pay | Admitting: *Deleted

## 2013-10-17 NOTE — Telephone Encounter (Signed)
Per POF and staff phone call I have scheduled. Patient called

## 2013-10-17 NOTE — Telephone Encounter (Signed)
Per Dr. Benay Spice, move chemo from 4/20 to 4/23. POF sent to scheduling. Lab at 8:45, 9:15 Ned Card, NP, ( 30 minute visit) and then chemotherapy. I informed patient that scheduling will be calling him. Patient verbalized understanding.

## 2013-10-17 NOTE — Telephone Encounter (Signed)
Call from pt to clarify schedule. Pt did not go on vacation as planned so he came in for treatment 10/09/13. Reviewed with Dr. Benay Spice: appts changed to 4/23 pt to see APP that day.

## 2013-10-19 ENCOUNTER — Other Ambulatory Visit: Payer: Self-pay | Admitting: Oncology

## 2013-10-20 ENCOUNTER — Other Ambulatory Visit: Payer: Federal, State, Local not specified - PPO

## 2013-10-20 ENCOUNTER — Ambulatory Visit: Payer: Federal, State, Local not specified - PPO | Admitting: Oncology

## 2013-10-20 ENCOUNTER — Inpatient Hospital Stay: Payer: Federal, State, Local not specified - PPO

## 2013-10-20 ENCOUNTER — Encounter: Payer: Federal, State, Local not specified - PPO | Admitting: Nutrition

## 2013-10-23 ENCOUNTER — Ambulatory Visit (HOSPITAL_BASED_OUTPATIENT_CLINIC_OR_DEPARTMENT_OTHER): Payer: Federal, State, Local not specified - PPO

## 2013-10-23 ENCOUNTER — Other Ambulatory Visit (HOSPITAL_BASED_OUTPATIENT_CLINIC_OR_DEPARTMENT_OTHER): Payer: Federal, State, Local not specified - PPO

## 2013-10-23 ENCOUNTER — Other Ambulatory Visit: Payer: Self-pay | Admitting: *Deleted

## 2013-10-23 ENCOUNTER — Telehealth: Payer: Self-pay | Admitting: Oncology

## 2013-10-23 ENCOUNTER — Ambulatory Visit (HOSPITAL_BASED_OUTPATIENT_CLINIC_OR_DEPARTMENT_OTHER): Payer: Federal, State, Local not specified - PPO | Admitting: Nurse Practitioner

## 2013-10-23 ENCOUNTER — Ambulatory Visit: Payer: Federal, State, Local not specified - PPO | Admitting: Nutrition

## 2013-10-23 VITALS — BP 145/64 | HR 49 | Temp 98.4°F | Resp 18 | Ht 69.0 in | Wt 191.3 lb

## 2013-10-23 DIAGNOSIS — R112 Nausea with vomiting, unspecified: Secondary | ICD-10-CM

## 2013-10-23 DIAGNOSIS — R197 Diarrhea, unspecified: Secondary | ICD-10-CM

## 2013-10-23 DIAGNOSIS — C786 Secondary malignant neoplasm of retroperitoneum and peritoneum: Secondary | ICD-10-CM

## 2013-10-23 DIAGNOSIS — C762 Malignant neoplasm of abdomen: Secondary | ICD-10-CM

## 2013-10-23 DIAGNOSIS — C801 Malignant (primary) neoplasm, unspecified: Secondary | ICD-10-CM

## 2013-10-23 DIAGNOSIS — R109 Unspecified abdominal pain: Secondary | ICD-10-CM

## 2013-10-23 DIAGNOSIS — Z5111 Encounter for antineoplastic chemotherapy: Secondary | ICD-10-CM

## 2013-10-23 DIAGNOSIS — J4489 Other specified chronic obstructive pulmonary disease: Secondary | ICD-10-CM

## 2013-10-23 DIAGNOSIS — G8929 Other chronic pain: Secondary | ICD-10-CM

## 2013-10-23 DIAGNOSIS — M549 Dorsalgia, unspecified: Secondary | ICD-10-CM

## 2013-10-23 DIAGNOSIS — J449 Chronic obstructive pulmonary disease, unspecified: Secondary | ICD-10-CM

## 2013-10-23 LAB — CBC WITH DIFFERENTIAL/PLATELET
BASO%: 0.3 % (ref 0.0–2.0)
BASOS ABS: 0 10*3/uL (ref 0.0–0.1)
EOS%: 2.1 % (ref 0.0–7.0)
Eosinophils Absolute: 0.2 10*3/uL (ref 0.0–0.5)
HEMATOCRIT: 41.7 % (ref 38.4–49.9)
HGB: 13.8 g/dL (ref 13.0–17.1)
LYMPH%: 21.3 % (ref 14.0–49.0)
MCH: 30.6 pg (ref 27.2–33.4)
MCHC: 33.1 g/dL (ref 32.0–36.0)
MCV: 92.5 fL (ref 79.3–98.0)
MONO#: 0.6 10*3/uL (ref 0.1–0.9)
MONO%: 6.9 % (ref 0.0–14.0)
NEUT#: 6.3 10*3/uL (ref 1.5–6.5)
NEUT%: 69.4 % (ref 39.0–75.0)
Platelets: 207 10*3/uL (ref 140–400)
RBC: 4.51 10*6/uL (ref 4.20–5.82)
RDW: 13.7 % (ref 11.0–14.6)
WBC: 9.1 10*3/uL (ref 4.0–10.3)
lymph#: 1.9 10*3/uL (ref 0.9–3.3)

## 2013-10-23 LAB — COMPREHENSIVE METABOLIC PANEL (CC13)
ALK PHOS: 65 U/L (ref 40–150)
ALT: 22 U/L (ref 0–55)
AST: 35 U/L — AB (ref 5–34)
Albumin: 3.7 g/dL (ref 3.5–5.0)
Anion Gap: 9 mEq/L (ref 3–11)
BILIRUBIN TOTAL: 0.41 mg/dL (ref 0.20–1.20)
BUN: 9.2 mg/dL (ref 7.0–26.0)
CO2: 26 mEq/L (ref 22–29)
CREATININE: 1 mg/dL (ref 0.7–1.3)
Calcium: 9.7 mg/dL (ref 8.4–10.4)
Chloride: 104 mEq/L (ref 98–109)
Glucose: 146 mg/dl — ABNORMAL HIGH (ref 70–140)
Potassium: 4.3 mEq/L (ref 3.5–5.1)
Sodium: 139 mEq/L (ref 136–145)
Total Protein: 6.9 g/dL (ref 6.4–8.3)

## 2013-10-23 MED ORDER — FENTANYL 75 MCG/HR TD PT72: 75.0000 ug | MEDICATED_PATCH | TRANSDERMAL | Status: DC

## 2013-10-23 MED ORDER — SODIUM CHLORIDE 0.9 % IV SOLN
4050.0000 mg | INTRAVENOUS | Status: DC
  Administered 2013-10-23: 4050 mg via INTRAVENOUS
  Filled 2013-10-23: qty 81

## 2013-10-23 MED ORDER — PALONOSETRON HCL INJECTION 0.25 MG/5ML
INTRAVENOUS | Status: AC
  Filled 2013-10-23: qty 5

## 2013-10-23 MED ORDER — ATROPINE SULFATE 1 MG/ML IJ SOLN
INTRAMUSCULAR | Status: AC
  Filled 2013-10-23: qty 1

## 2013-10-23 MED ORDER — OXYCODONE HCL 10 MG PO TABS
10.0000 mg | ORAL_TABLET | ORAL | Status: DC | PRN
Start: 2013-10-23 — End: 2013-12-10

## 2013-10-23 MED ORDER — DEXAMETHASONE SODIUM PHOSPHATE 20 MG/5ML IJ SOLN
12.0000 mg | Freq: Once | INTRAMUSCULAR | Status: AC
  Administered 2013-10-23: 12 mg via INTRAVENOUS

## 2013-10-23 MED ORDER — LEUCOVORIN CALCIUM INJECTION 350 MG
850.0000 mg | Freq: Once | INTRAVENOUS | Status: AC
  Administered 2013-10-23: 850 mg via INTRAVENOUS
  Filled 2013-10-23: qty 42.5

## 2013-10-23 MED ORDER — ATROPINE SULFATE 1 MG/ML IJ SOLN: 0.5000 mg | Freq: Once | INTRAMUSCULAR | Status: DC | PRN

## 2013-10-23 MED ORDER — IRINOTECAN HCL CHEMO INJECTION 100 MG/5ML
180.0000 mg/m2 | Freq: Once | INTRAVENOUS | Status: AC
  Administered 2013-10-23: 370 mg via INTRAVENOUS
  Filled 2013-10-23: qty 18.5

## 2013-10-23 MED ORDER — FOSAPREPITANT DIMEGLUMINE INJECTION 150 MG
150.0000 mg | Freq: Once | INTRAVENOUS | Status: AC
  Administered 2013-10-23: 150 mg via INTRAVENOUS
  Filled 2013-10-23: qty 5

## 2013-10-23 MED ORDER — PALONOSETRON HCL INJECTION 0.25 MG/5ML
0.2500 mg | Freq: Once | INTRAVENOUS | Status: AC
  Administered 2013-10-23: 0.25 mg via INTRAVENOUS

## 2013-10-23 MED ORDER — LORAZEPAM 1 MG PO TABS: 1.0000 mg | ORAL_TABLET | Freq: Four times a day (QID) | ORAL | Status: DC | PRN

## 2013-10-23 MED ORDER — DEXAMETHASONE SODIUM PHOSPHATE 20 MG/5ML IJ SOLN
INTRAMUSCULAR | Status: AC
  Filled 2013-10-23: qty 5

## 2013-10-23 MED ORDER — SODIUM CHLORIDE 0.9 % IV SOLN
Freq: Once | INTRAVENOUS | Status: AC
  Administered 2013-10-23: 11:00:00 via INTRAVENOUS

## 2013-10-23 NOTE — Progress Notes (Signed)
[place] OFFICE PROGRESS NOTE   Diagnosis: Metastatic adenocarcinoma with carcinomatosis.   INTERVAL HISTORY:   [name] completed cycle 2 FOLFIRI on 10/09/2013. Day 3 he began having loose stools on average 1 per day. He has noted increased indigestion. Energy level is poor. Around day 6 he developed abdominal pain, nausea/vomiting. Symptoms lasted 2-3 days. Stable chronic back and abdominal pain.  Objective:  Vital signs in last 24 hours:  Blood pressure 145/64, pulse 49, temperature 98.4 F (36.9 C), temperature source Oral, resp. rate 18, height 5\' 9"  (1.753 m), weight 191 lb 4.8 oz (86.773 kg).    HEENT: No thrush or ulcerations. Resp: Lungs clear. Cardio: Regular cardiac rhythm. GI: Soft with mild diffuse tenderness. No hepatomegaly. No mass. Vascular: No leg edema.  Port-A-Cath site is without erythema.  Lab Results:  Lab Results  Component Value Date   WBC 9.1 10/23/2013   HGB 13.8 10/23/2013   HCT 41.7 10/23/2013   MCV 92.5 10/23/2013   PLT 207 10/23/2013   NEUTROABS 6.3 10/23/2013    Imaging:  No results found.  Medications: I have reviewed the patient's current medications.  Assessment/Plan: 1. 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 CT of the chest 11/18/2012 with borderline enlarged mediastinal nodes, gastrohepatic ligament nodes and omental/peritoneal nodularity. Negative upper endoscopy. Normal CEA on 12/13/2012. Status post cycle 1 FOLFOX 12/11/2012. Status post cycle 2 FOLFOX 12/25/2012. Status post cycle 3 on 01/15/2013. Cycle 4 on 01/29/2013; cycle 5 on 02/12/2013; cycle 6 on 02/26/2013. Restaging CT evaluation showed improvement. He completed cycle 8 on 03/26/2013. Oxaliplatin was discontinued following cycle 8 due to progressive neuropathy symptoms. He began maintenance Xeloda  04/24/2013. Xeloda dose reduced beginning with cycle 5 on 07/26/2013.  CA 19-9 increased at 297.4 on 09/01/2013  CTs 09/01/2013 with decreased chest and abdomen lymphadenopathy, increased omental nodularity  Cycle 1 of FOLFIRI 09/11/2013. Cycle 2 FOLFIRI 10/09/2013. 2. Cecal/appendix mass noted at the time of exploratory laparotomy 11/13/2012. 3. COPD. 4. Pain secondary to carcinomatosis. 5. Chronic back pain. Stable.  6. Status post Port-A-Cath placement 12/02/2012. 7. Delayed nausea following cycle 1 FOLFOX. Aloxi was added with cycle 2. He had delayed nausea following cycle 2. Emend was added beginning with cycle 3. Prophylactic Decadron added beginning with cycle 8. He did not take as prescribed. He again had delayed nausea. 8. Diarrhea following cycle 2 FOLFOX. Question related to 5-fluorouracil. The diarrhea improved with Lomotil. The 5-fluorouracil bolus was eliminated and the dose of the 5 fluorouracil infusion was reduced beginning with cycle 3.  9. Left mid back pain/tenderness just lateral to the spine. Question etiology. Duragesic patch increased to 75 mcg every 3 days. He takes oxycodone as needed. He continues to have back pain. 10. Anorexia/weight loss. He began a trial of Megace following office visit 01/08/2013. Appetite is better. No longer taking Megace.  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 Increased with moderate loss of vibratory sense at the fingertips per tuning fork exam and interfering with activity when here 04/09/2013. Oxaliplatin was discontinued. 12. Nausea/vomiting with cycle 1 Xeloda. He has persistent nausea while on Xeloda. Improved with dose reduction beginning with cycle 5. 13. Nausea/vomiting and increased abdominal pain approximately 1 week following cycle 2 FOLFIRI. Resolved.   Disposition: He appears stable. He has completed 2 cycles of FOLFIRI. Plan to proceed  with cycle 3  today as scheduled.  We will followup on the CA 19.9 from today.  He plans on beginning a vacation in the near future. He will return a followup visit and the next cycle of chemotherapy on 11/25/2012.  He will contact the office in the interim with any problems.  The episode of nausea/vomiting and abdominal pain one week following cycle 2 FOLFIRI was likely unrelated to the chemotherapy. He will contact the office if symptoms recur following cycle 3.  Plan reviewed with Dr. Benay Spice.      Owens Shark ANP/GNP-BC   10/23/2013  10:14 AM

## 2013-10-23 NOTE — Patient Instructions (Signed)
[facility] Discharge Instructions for Patients Receiving Chemotherapy  Today you received the following chemotherapy agents leucovorin/irinotecan/fluorouracil  To help prevent nausea and vomiting after your treatment, we encourage you to take your nausea medication as directed   If you develop nausea and vomiting that is not controlled by your nausea medication, call the clinic.   BELOW ARE SYMPTOMS THAT SHOULD BE REPORTED IMMEDIATELY:  *FEVER GREATER THAN 100.5 F  *CHILLS WITH OR WITHOUT FEVER  NAUSEA AND VOMITING THAT IS NOT CONTROLLED WITH YOUR NAUSEA MEDICATION  *UNUSUAL SHORTNESS OF BREATH  *UNUSUAL BRUISING OR BLEEDING  TENDERNESS IN MOUTH AND THROAT WITH OR WITHOUT PRESENCE OF ULCERS  *URINARY PROBLEMS  *BOWEL PROBLEMS  UNUSUAL RASH Items with * indicate a potential emergency and should be followed up as soon as possible.  Feel free to call the clinic you have any questions or concerns. The clinic phone number is [phone]. 

## 2013-10-23 NOTE — Telephone Encounter (Signed)
gv adn printed appt sched and avs for pt for April and May....sed added tx. °

## 2013-10-23 NOTE — Progress Notes (Signed)
Brief followup completed with patient during chemotherapy.  Patient reports he is doing well.  He continues to force himself to eat.  He is following appropriate dietary strategies secondary to indigestion and is taking medications.  Patient reports weight fluctuates.  Weight documented as 191.3 pounds on April 23 increased from 189 pounds February 11.  No other nutrition concerns verbalized.  Nutrition diagnosis: Unintended weight loss improved.  Intervention: Patient educated to continue smaller, more frequent meals and avoid foods which cause indigestion.  He is to continue to take medications as prescribed.  Patient to consume oral nutrition supplements as needed if oral intake decreases.  Teach back method used.  Monitoring, evaluation, goals: Patient will tolerate adequate calories and protein to maintain usual body weight.  Next visit: Tuesday, May 26, during chemotherapy.

## 2013-10-24 ENCOUNTER — Other Ambulatory Visit: Payer: Self-pay | Admitting: Family Medicine

## 2013-10-24 LAB — CANCER ANTIGEN 19-9: CA 19-9: 390.6 U/mL — ABNORMAL HIGH (ref <35.0)

## 2013-10-24 NOTE — Telephone Encounter (Signed)
Medication filled x1 with no refills.   Requires office visit before any further refills can be given.  

## 2013-10-25 ENCOUNTER — Ambulatory Visit (HOSPITAL_BASED_OUTPATIENT_CLINIC_OR_DEPARTMENT_OTHER): Payer: Federal, State, Local not specified - PPO

## 2013-10-25 VITALS — BP 152/70 | HR 51 | Temp 97.4°F

## 2013-10-25 DIAGNOSIS — C801 Malignant (primary) neoplasm, unspecified: Secondary | ICD-10-CM

## 2013-10-25 DIAGNOSIS — C786 Secondary malignant neoplasm of retroperitoneum and peritoneum: Secondary | ICD-10-CM

## 2013-10-25 DIAGNOSIS — Z452 Encounter for adjustment and management of vascular access device: Secondary | ICD-10-CM

## 2013-10-25 MED ORDER — SODIUM CHLORIDE 0.9 % IJ SOLN
10.0000 mL | INTRAMUSCULAR | Status: DC | PRN
  Administered 2013-10-25: 10 mL
  Filled 2013-10-25: qty 10

## 2013-10-25 MED ORDER — HEPARIN SOD (PORK) LOCK FLUSH 100 UNIT/ML IV SOLN
500.0000 [IU] | Freq: Once | INTRAVENOUS | Status: AC | PRN
  Administered 2013-10-25: 500 [IU]
  Filled 2013-10-25: qty 5

## 2013-11-06 ENCOUNTER — Other Ambulatory Visit: Payer: Self-pay | Admitting: *Deleted

## 2013-11-06 DIAGNOSIS — C762 Malignant neoplasm of abdomen: Secondary | ICD-10-CM

## 2013-11-06 DIAGNOSIS — C801 Malignant (primary) neoplasm, unspecified: Secondary | ICD-10-CM

## 2013-11-06 MED ORDER — FENTANYL 75 MCG/HR TD PT72: 75.0000 ug | MEDICATED_PATCH | TRANSDERMAL | Status: DC

## 2013-11-06 NOTE — Telephone Encounter (Signed)
Going out of town on Sunday and will run out of Duragesic 75 mcg patches before he returns. Needs to pick up a script to take out of town to be filled when he is out-around May 21st. Made him script will be ready for pick up by this afternoon.

## 2013-11-25 ENCOUNTER — Ambulatory Visit: Payer: Federal, State, Local not specified - PPO | Admitting: Nutrition

## 2013-11-25 ENCOUNTER — Ambulatory Visit (HOSPITAL_BASED_OUTPATIENT_CLINIC_OR_DEPARTMENT_OTHER): Payer: Federal, State, Local not specified - PPO

## 2013-11-25 ENCOUNTER — Ambulatory Visit (HOSPITAL_BASED_OUTPATIENT_CLINIC_OR_DEPARTMENT_OTHER): Payer: Federal, State, Local not specified - PPO | Admitting: Oncology

## 2013-11-25 ENCOUNTER — Telehealth: Payer: Self-pay | Admitting: Oncology

## 2013-11-25 ENCOUNTER — Other Ambulatory Visit (HOSPITAL_BASED_OUTPATIENT_CLINIC_OR_DEPARTMENT_OTHER): Payer: Federal, State, Local not specified - PPO

## 2013-11-25 ENCOUNTER — Other Ambulatory Visit: Payer: Self-pay | Admitting: Oncology

## 2013-11-25 VITALS — BP 135/61 | HR 50 | Temp 98.4°F | Resp 18 | Ht 69.0 in | Wt 181.9 lb

## 2013-11-25 DIAGNOSIS — C762 Malignant neoplasm of abdomen: Secondary | ICD-10-CM

## 2013-11-25 DIAGNOSIS — Z5111 Encounter for antineoplastic chemotherapy: Secondary | ICD-10-CM

## 2013-11-25 DIAGNOSIS — C786 Secondary malignant neoplasm of retroperitoneum and peritoneum: Secondary | ICD-10-CM

## 2013-11-25 DIAGNOSIS — M549 Dorsalgia, unspecified: Secondary | ICD-10-CM

## 2013-11-25 DIAGNOSIS — G620 Drug-induced polyneuropathy: Secondary | ICD-10-CM

## 2013-11-25 DIAGNOSIS — C801 Malignant (primary) neoplasm, unspecified: Secondary | ICD-10-CM

## 2013-11-25 DIAGNOSIS — G893 Neoplasm related pain (acute) (chronic): Secondary | ICD-10-CM

## 2013-11-25 LAB — COMPREHENSIVE METABOLIC PANEL (CC13)
ALBUMIN: 3.8 g/dL (ref 3.5–5.0)
ALT: 11 U/L (ref 0–55)
AST: 16 U/L (ref 5–34)
Alkaline Phosphatase: 58 U/L (ref 40–150)
Anion Gap: 11 mEq/L (ref 3–11)
BUN: 11.3 mg/dL (ref 7.0–26.0)
CO2: 21 mEq/L — ABNORMAL LOW (ref 22–29)
Calcium: 9.6 mg/dL (ref 8.4–10.4)
Chloride: 105 mEq/L (ref 98–109)
Creatinine: 0.9 mg/dL (ref 0.7–1.3)
Glucose: 158 mg/dl — ABNORMAL HIGH (ref 70–140)
POTASSIUM: 4.4 meq/L (ref 3.5–5.1)
SODIUM: 137 meq/L (ref 136–145)
TOTAL PROTEIN: 7.2 g/dL (ref 6.4–8.3)
Total Bilirubin: 0.49 mg/dL (ref 0.20–1.20)

## 2013-11-25 LAB — CBC WITH DIFFERENTIAL/PLATELET
BASO%: 0.9 % (ref 0.0–2.0)
Basophils Absolute: 0.1 10*3/uL (ref 0.0–0.1)
EOS%: 1.5 % (ref 0.0–7.0)
Eosinophils Absolute: 0.1 10*3/uL (ref 0.0–0.5)
HCT: 46.6 % (ref 38.4–49.9)
HGB: 15.3 g/dL (ref 13.0–17.1)
LYMPH%: 18.9 % (ref 14.0–49.0)
MCH: 30.1 pg (ref 27.2–33.4)
MCHC: 32.8 g/dL (ref 32.0–36.0)
MCV: 91.8 fL (ref 79.3–98.0)
MONO#: 0.6 10*3/uL (ref 0.1–0.9)
MONO%: 6.6 % (ref 0.0–14.0)
NEUT#: 6.9 10*3/uL — ABNORMAL HIGH (ref 1.5–6.5)
NEUT%: 72.1 % (ref 39.0–75.0)
Platelets: 232 10*3/uL (ref 140–400)
RBC: 5.07 10*6/uL (ref 4.20–5.82)
RDW: 14 % (ref 11.0–14.6)
WBC: 9.5 10*3/uL (ref 4.0–10.3)
lymph#: 1.8 10*3/uL (ref 0.9–3.3)

## 2013-11-25 LAB — CANCER ANTIGEN 19-9: CA 19-9: 490.4 U/mL — ABNORMAL HIGH (ref <35.0)

## 2013-11-25 MED ORDER — PALONOSETRON HCL INJECTION 0.25 MG/5ML
0.2500 mg | Freq: Once | INTRAVENOUS | Status: AC
  Administered 2013-11-25: 0.25 mg via INTRAVENOUS

## 2013-11-25 MED ORDER — DEXAMETHASONE SODIUM PHOSPHATE 20 MG/5ML IJ SOLN
INTRAMUSCULAR | Status: AC
  Filled 2013-11-25: qty 5

## 2013-11-25 MED ORDER — PALONOSETRON HCL INJECTION 0.25 MG/5ML
INTRAVENOUS | Status: AC
  Filled 2013-11-25: qty 5

## 2013-11-25 MED ORDER — IRINOTECAN HCL CHEMO INJECTION 100 MG/5ML
180.0000 mg/m2 | Freq: Once | INTRAVENOUS | Status: AC
  Administered 2013-11-25: 370 mg via INTRAVENOUS
  Filled 2013-11-25: qty 18.5

## 2013-11-25 MED ORDER — SODIUM CHLORIDE 0.9 % IV SOLN
150.0000 mg | Freq: Once | INTRAVENOUS | Status: AC
  Administered 2013-11-25: 150 mg via INTRAVENOUS
  Filled 2013-11-25: qty 5

## 2013-11-25 MED ORDER — SODIUM CHLORIDE 0.9 % IV SOLN
Freq: Once | INTRAVENOUS | Status: AC
  Administered 2013-11-25: 12:00:00 via INTRAVENOUS

## 2013-11-25 MED ORDER — DEXAMETHASONE SODIUM PHOSPHATE 20 MG/5ML IJ SOLN
12.0000 mg | Freq: Once | INTRAMUSCULAR | Status: AC
  Administered 2013-11-25: 12 mg via INTRAVENOUS

## 2013-11-25 MED ORDER — LEUCOVORIN CALCIUM INJECTION 350 MG
415.0000 mg/m2 | Freq: Once | INTRAVENOUS | Status: AC
  Administered 2013-11-25: 850 mg via INTRAVENOUS
  Filled 2013-11-25: qty 42.5

## 2013-11-25 MED ORDER — SODIUM CHLORIDE 0.9 % IV SOLN
1970.0000 mg/m2 | INTRAVENOUS | Status: DC
  Administered 2013-11-25: 4050 mg via INTRAVENOUS
  Filled 2013-11-25: qty 81

## 2013-11-25 NOTE — Progress Notes (Signed)
Brief nutrition followup completed with patient in the chemotherapy area.  He is being treated for metastatic gastrointestinal carcinoma.  Patient reports he is doing well.  He had a great vacation.  Patient's weight decreased 10 pounds over the last month with weight being documented as 181 pounds from 191.3 pounds may 23rd.  Patient denies new nutrition issues.  Nutrition diagnosis: Unintended weight loss continues.  Intervention: Patient encouraged to continue high-calorie, high-protein foods. Patient to resume oral nutrition supplements. Questions answered.  Teach back method used.  Monitoring, evaluation, goals: Patient is tolerating oral intake but has had continued weight loss.  Next visit: Wednesday, June 24, during chemotherapy.

## 2013-11-25 NOTE — Telephone Encounter (Signed)
gv pt appt schedule for may/june °

## 2013-11-25 NOTE — Patient Instructions (Signed)
[facility] Discharge Instructions for Patients Receiving Chemotherapy  Today you received the following chemotherapy agents: Irinotecan, Leucovorin, 5FU   To help prevent nausea and vomiting after your treatment, we encourage you to take your nausea medication as prescribed.    If you develop nausea and vomiting that is not controlled by your nausea medication, call the clinic.   BELOW ARE SYMPTOMS THAT SHOULD BE REPORTED IMMEDIATELY:  *FEVER GREATER THAN 100.5 F  *CHILLS WITH OR WITHOUT FEVER  NAUSEA AND VOMITING THAT IS NOT CONTROLLED WITH YOUR NAUSEA MEDICATION  *UNUSUAL SHORTNESS OF BREATH  *UNUSUAL BRUISING OR BLEEDING  TENDERNESS IN MOUTH AND THROAT WITH OR WITHOUT PRESENCE OF ULCERS  *URINARY PROBLEMS  *BOWEL PROBLEMS  UNUSUAL RASH Items with * indicate a potential emergency and should be followed up as soon as possible.  Feel free to call the clinic you have any questions or concerns. The clinic phone number is [phone].    

## 2013-11-25 NOTE — Progress Notes (Signed)
[place] OFFICE PROGRESS NOTE   Diagnosis: Metastatic gastrointestinal carcinoma  INTERVAL HISTORY:   [name] completed a third cycle of FOLFIRI 10/23/2013. He reports malaise following chemotherapy. He continues to have abdomen and back pain. He returned from a vacation within the past few days. No difficulty with bowel function. The neuropathy symptoms in the hands and feet have improved.  Objective:  Vital signs in last 24 hours:  Blood pressure 135/61, pulse 50, temperature 98.4 F (36.9 C), temperature source Oral, resp. rate 18, height 5\' 9"  (1.753 m), weight 181 lb 14.4 oz (82.509 kg).    HEENT: No thrush Resp: Bronchial sounds bilaterally, no respiratory distress Cardio: Regular rate and rhythm with premature beats GI: No hepatomegaly, no mass, no apparent ascites Vascular: No leg edema  Portacath/PICC-without erythema  Lab Results:  Lab Results  Component Value Date   WBC 9.5 11/25/2013   HGB 15.3 11/25/2013   HCT 46.6 11/25/2013   MCV 91.8 11/25/2013   PLT 232 11/25/2013   NEUTROABS 6.9* 11/25/2013    Medications: I have reviewed the patient's current medications.  Assessment/Plan: 1. 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 CT of the chest 11/18/2012 with borderline enlarged mediastinal nodes, gastrohepatic ligament nodes and omental/peritoneal nodularity. Negative upper endoscopy. Normal CEA on 12/13/2012. Status post cycle 1 FOLFOX 12/11/2012. Status post cycle 2 FOLFOX 12/25/2012. Status post cycle 3 on 01/15/2013. Cycle 4 on 01/29/2013; cycle 5 on 02/12/2013; cycle 6 on 02/26/2013. Restaging CT evaluation showed improvement. He completed cycle 8 on 03/26/2013. Oxaliplatin was discontinued following cycle 8 due to progressive neuropathy symptoms. He began maintenance Xeloda 04/24/2013. Xeloda  dose reduced beginning with cycle 5 on 07/26/2013.  CA 19-9 increased at 297.4 on 09/01/2013  CTs 09/01/2013 with decreased chest and abdomen lymphadenopathy, increased omental nodularity  Cycle 1 of FOLFIRI 09/11/2013.  Cycle 2 FOLFIRI 10/09/2013. Cycle 3 FOLFIRI 10/23/2013 2. Cecal/appendix mass noted at the time of exploratory laparotomy 11/13/2012. 3. COPD. 4. Pain secondary to carcinomatosis. 5. Chronic back pain. Stable.  6. Status post Port-A-Cath placement 12/02/2012. 7. Delayed nausea following cycle 1 FOLFOX. Aloxi was added with cycle 2. He had delayed nausea following cycle 2. Emend was added beginning with cycle 3. Prophylactic Decadron added beginning with cycle 8. He did not take as prescribed. He again had delayed nausea. 8. Diarrhea following cycle 2 FOLFOX. Question related to 5-fluorouracil. The diarrhea improved with Lomotil. The 5-fluorouracil bolus was eliminated and the dose of the 5 fluorouracil infusion was reduced beginning with cycle 3.  9. Left mid back pain/tenderness just lateral to the spine. Question etiology. Duragesic patch increased to 75 mcg every 3 days. He takes oxycodone as needed. He continues to have back pain. 10. Anorexia/weight loss. He began a trial of Megace following office visit 01/08/2013. Appetite is better. No longer taking Megace.  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 Increased with moderate loss of vibratory sense at the fingertips per tuning fork exam and interfering with activity when here 04/09/2013. Oxaliplatin was discontinued. Improved. 12. Nausea/vomiting with cycle 1 Xeloda. He has persistent nausea while on Xeloda. Improved with dose reduction beginning with cycle 5.  Disposition:  He appears stable. The plan is to proceed with cycle 4 of FOLFIRI today. We will followup on the CA 19-9 from today. He will return for an office visit and the  next cycle of FOLFIRI 12/10/2013.  Ladell Pier, MD  11/25/2013  11:19 AM

## 2013-11-27 ENCOUNTER — Ambulatory Visit (HOSPITAL_BASED_OUTPATIENT_CLINIC_OR_DEPARTMENT_OTHER): Payer: Federal, State, Local not specified - PPO

## 2013-11-27 DIAGNOSIS — Z95828 Presence of other vascular implants and grafts: Secondary | ICD-10-CM

## 2013-11-27 DIAGNOSIS — C801 Malignant (primary) neoplasm, unspecified: Secondary | ICD-10-CM

## 2013-11-27 DIAGNOSIS — Z452 Encounter for adjustment and management of vascular access device: Secondary | ICD-10-CM

## 2013-11-27 DIAGNOSIS — C786 Secondary malignant neoplasm of retroperitoneum and peritoneum: Secondary | ICD-10-CM

## 2013-11-27 MED ORDER — SODIUM CHLORIDE 0.9 % IJ SOLN
10.0000 mL | INTRAMUSCULAR | Status: DC | PRN
  Administered 2013-11-27: 10 mL via INTRAVENOUS
  Filled 2013-11-27: qty 10

## 2013-11-27 MED ORDER — HEPARIN SOD (PORK) LOCK FLUSH 100 UNIT/ML IV SOLN
500.0000 [IU] | Freq: Once | INTRAVENOUS | Status: AC
  Administered 2013-11-27: 500 [IU] via INTRAVENOUS
  Filled 2013-11-27: qty 5

## 2013-12-07 ENCOUNTER — Other Ambulatory Visit: Payer: Self-pay | Admitting: Oncology

## 2013-12-10 ENCOUNTER — Inpatient Hospital Stay: Payer: Federal, State, Local not specified - PPO

## 2013-12-10 ENCOUNTER — Ambulatory Visit (HOSPITAL_COMMUNITY)
Admission: RE | Admit: 2013-12-10 | Discharge: 2013-12-10 | Disposition: A | Discharge status: Home / Self Care | Payer: Federal, State, Local not specified - PPO | Source: Ambulatory Visit | Attending: Oncology | Admitting: Oncology

## 2013-12-10 ENCOUNTER — Other Ambulatory Visit (HOSPITAL_BASED_OUTPATIENT_CLINIC_OR_DEPARTMENT_OTHER): Payer: Federal, State, Local not specified - PPO

## 2013-12-10 ENCOUNTER — Ambulatory Visit (HOSPITAL_BASED_OUTPATIENT_CLINIC_OR_DEPARTMENT_OTHER): Payer: Federal, State, Local not specified - PPO | Admitting: Oncology

## 2013-12-10 ENCOUNTER — Telehealth: Payer: Self-pay | Admitting: Oncology

## 2013-12-10 ENCOUNTER — Telehealth: Payer: Self-pay | Admitting: *Deleted

## 2013-12-10 VITALS — BP 133/78 | HR 46 | Temp 98.7°F | Resp 18 | Ht 69.0 in | Wt 183.4 lb

## 2013-12-10 DIAGNOSIS — R141 Gas pain: Secondary | ICD-10-CM

## 2013-12-10 DIAGNOSIS — F17200 Nicotine dependence, unspecified, uncomplicated: Secondary | ICD-10-CM | POA: Diagnosis present

## 2013-12-10 DIAGNOSIS — C801 Malignant (primary) neoplasm, unspecified: Secondary | ICD-10-CM

## 2013-12-10 DIAGNOSIS — C762 Malignant neoplasm of abdomen: Secondary | ICD-10-CM

## 2013-12-10 DIAGNOSIS — J4489 Other specified chronic obstructive pulmonary disease: Secondary | ICD-10-CM | POA: Diagnosis present

## 2013-12-10 DIAGNOSIS — Z6827 Body mass index (BMI) 27.0-27.9, adult: Secondary | ICD-10-CM

## 2013-12-10 DIAGNOSIS — C786 Secondary malignant neoplasm of retroperitoneum and peritoneum: Secondary | ICD-10-CM

## 2013-12-10 DIAGNOSIS — J449 Chronic obstructive pulmonary disease, unspecified: Secondary | ICD-10-CM | POA: Diagnosis present

## 2013-12-10 DIAGNOSIS — R143 Flatulence: Secondary | ICD-10-CM

## 2013-12-10 DIAGNOSIS — Z79899 Other long term (current) drug therapy: Secondary | ICD-10-CM

## 2013-12-10 DIAGNOSIS — K5669 Other intestinal obstruction: Secondary | ICD-10-CM | POA: Diagnosis present

## 2013-12-10 DIAGNOSIS — I10 Essential (primary) hypertension: Secondary | ICD-10-CM | POA: Diagnosis present

## 2013-12-10 DIAGNOSIS — E119 Type 2 diabetes mellitus without complications: Secondary | ICD-10-CM | POA: Diagnosis present

## 2013-12-10 DIAGNOSIS — G893 Neoplasm related pain (acute) (chronic): Secondary | ICD-10-CM | POA: Diagnosis present

## 2013-12-10 DIAGNOSIS — R142 Eructation: Secondary | ICD-10-CM

## 2013-12-10 DIAGNOSIS — E43 Unspecified severe protein-calorie malnutrition: Secondary | ICD-10-CM | POA: Diagnosis present

## 2013-12-10 LAB — CBC WITH DIFFERENTIAL/PLATELET
BASO%: 1.2 % (ref 0.0–2.0)
Basophils Absolute: 0.1 10*3/uL (ref 0.0–0.1)
EOS%: 2.3 % (ref 0.0–7.0)
Eosinophils Absolute: 0.2 10*3/uL (ref 0.0–0.5)
HCT: 42.1 % (ref 38.4–49.9)
HGB: 14 g/dL (ref 13.0–17.1)
LYMPH%: 21.5 % (ref 14.0–49.0)
MCH: 30.3 pg (ref 27.2–33.4)
MCHC: 33.4 g/dL (ref 32.0–36.0)
MCV: 90.9 fL (ref 79.3–98.0)
MONO#: 0.6 10*3/uL (ref 0.1–0.9)
MONO%: 6 % (ref 0.0–14.0)
NEUT%: 69 % (ref 39.0–75.0)
NEUTROS ABS: 6.5 10*3/uL (ref 1.5–6.5)
PLATELETS: 239 10*3/uL (ref 140–400)
RBC: 4.63 10*6/uL (ref 4.20–5.82)
RDW: 14.1 % (ref 11.0–14.6)
WBC: 9.4 10*3/uL (ref 4.0–10.3)
lymph#: 2 10*3/uL (ref 0.9–3.3)

## 2013-12-10 LAB — COMPREHENSIVE METABOLIC PANEL (CC13)
ALBUMIN: 3.6 g/dL (ref 3.5–5.0)
ALK PHOS: 71 U/L (ref 40–150)
ALT: 20 U/L (ref 0–55)
AST: 14 U/L (ref 5–34)
Anion Gap: 9 mEq/L (ref 3–11)
BUN: 8.4 mg/dL (ref 7.0–26.0)
CO2: 26 mEq/L (ref 22–29)
Calcium: 9.3 mg/dL (ref 8.4–10.4)
Chloride: 105 mEq/L (ref 98–109)
Creatinine: 1 mg/dL (ref 0.7–1.3)
Glucose: 172 mg/dl — ABNORMAL HIGH (ref 70–140)
POTASSIUM: 4.5 meq/L (ref 3.5–5.1)
SODIUM: 140 meq/L (ref 136–145)
TOTAL PROTEIN: 6.7 g/dL (ref 6.4–8.3)
Total Bilirubin: 0.36 mg/dL (ref 0.20–1.20)

## 2013-12-10 LAB — CANCER ANTIGEN 19-9: CA 19-9: 552.6 U/mL — ABNORMAL HIGH (ref <35.0)

## 2013-12-10 MED ORDER — LORAZEPAM 1 MG PO TABS: 1.0000 mg | ORAL_TABLET | Freq: Four times a day (QID) | ORAL | Status: DC | PRN

## 2013-12-10 MED ORDER — OXYCODONE HCL 10 MG PO TABS: 10.0000 mg | ORAL_TABLET | ORAL | Status: DC | PRN

## 2013-12-10 NOTE — Telephone Encounter (Signed)
Called pt, informed him Xray is consistent with bowel obstruction. Need to be admitted to hospital, per Dr. Benay Spice. Pt requests to wait until 6/11. OK, per Dr. Benay Spice. Start clear liquid diet, office or hospital will call to instruct pt when to report to hospital. Pt voiced understanding.

## 2013-12-10 NOTE — Telephone Encounter (Signed)
Per staff message and POF I have scheduled appts. Per scheduler patient will have treatment day after MD visit.   JMW

## 2013-12-10 NOTE — Progress Notes (Signed)
[place] OFFICE PROGRESS NOTE   Diagnosis: Metastatic carcinoma  INTERVAL HISTORY:   Frank Baird returns as scheduled. He completed another cycle of FOLFIRI 11/25/2013. He reports a few episodes of diarrhea following chemotherapy. He has developed severe "belching "for the past few weeks, bloating, and increased nausea. The belching is worse after he eats. His bowels are moving every 1-2 days. He had vomiting for 2 days following chemotherapy. He has increased abdominal pain. He takes oxycodone and continues a Duragesic patch. He passes flatus, but mostly has belching.  Objective:  Vital signs in last 24 hours:  Blood pressure 133/78, pulse 46, temperature 98.7 F (37.1 C), temperature source Oral, resp. rate 18, height 5\' 9"  (1.753 m), weight 183 lb 6.4 oz (83.19 kg).    HEENT: Mild whitecoat over the tongue, no buccal thrush or ulcers Resp: Lungs clear bilaterally Cardio: Regular rate and rhythm with premature beats GI: Mildly distended, hyperactive bowel sounds, tender in the right greater than left abdomen, no mass, no hepatomegaly Vascular: No leg edema   Portacath/PICC-without erythema  Lab Results:  Lab Results  Component Value Date   WBC 9.4 12/10/2013   HGB 14.0 12/10/2013   HCT 42.1 12/10/2013   MCV 90.9 12/10/2013   PLT 239 12/10/2013   NEUTROABS 6.5 12/10/2013   CA 19-9 on 11/25/2013-490   Medications: I have reviewed the patient's current medications.  Assessment/Plan: 1. 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 CT of the chest 11/18/2012 with borderline enlarged mediastinal nodes, gastrohepatic ligament nodes and omental/peritoneal nodularity. Negative upper endoscopy. Normal CEA on 12/13/2012. Status post cycle 1 FOLFOX 12/11/2012. Status post cycle 2 FOLFOX 12/25/2012. Status post cycle 3 on  01/15/2013. Cycle 4 on 01/29/2013; cycle 5 on 02/12/2013; cycle 6 on 02/26/2013. Restaging CT evaluation showed improvement. He completed cycle 8 on 03/26/2013. Oxaliplatin was discontinued following cycle 8 due to progressive neuropathy symptoms. He began maintenance Xeloda 04/24/2013. Xeloda dose reduced beginning with cycle 5 on 07/26/2013.  CA 19-9 increased at 297.4 on 09/01/2013  CTs 09/01/2013 with decreased chest and abdomen lymphadenopathy, increased omental nodularity  Cycle 1 of FOLFIRI 09/11/2013.  Cycle 2 FOLFIRI 10/09/2013.  Cycle 3 FOLFIRI 10/23/2013 Cycle 4 FOLFIRI 11/25/2013 2. Cecal/appendix mass noted at the time of exploratory laparotomy 11/13/2012. 3. COPD. 4. Pain secondary to carcinomatosis. 5. Chronic back pain. Stable.  6. Status post Port-A-Cath placement 12/02/2012. 7. Delayed nausea following cycle 1 FOLFOX. Aloxi was added with cycle 2. He had delayed nausea following cycle 2. Emend was added beginning with cycle 3. Prophylactic Decadron added beginning with cycle 8. He did not take as prescribed. He again had delayed nausea. 8. Diarrhea following cycle 2 FOLFOX. Question related to 5-fluorouracil. The diarrhea improved with Lomotil. The 5-fluorouracil bolus was eliminated and the dose of the 5 fluorouracil infusion was reduced beginning with cycle 3.  9. Left mid back pain/tenderness just lateral to the spine. Question etiology. Duragesic patch increased to 75 mcg every 3 days. He takes oxycodone as needed. He continues to have back pain. 10. Anorexia/weight loss. He began a trial of Megace following office visit 01/08/2013. Appetite is better. No longer taking Megace.  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 Increased with moderate loss of vibratory sense at the fingertips per tuning fork exam and interfering with activity when here 04/09/2013. Oxaliplatin was discontinued.  Improved.  12. Nausea/vomiting with cycle 1 Xeloda. He has persistent nausea while on Xeloda. Improved with dose reduction beginning with cycle 5. 13. Frequent belching and abdominal bloating following cycle 4 FOLFIRI   Disposition:  Mr. Cruces returns for scheduled chemotherapy. He has developed abdominal bloating and frequent belching over the past few weeks. I am concerned he is developing a partial bowel obstruction. He will be referred for plain x-rays of the abdomen. Chemotherapy will be held today. We will schedule a CT scan pending the plain x-ray findings. He will return for an office visit 12/16/2013. He will contact us in the interim for nausea and vomiting. I recommended he switch to a liquid diet.  Betsy Coder, MD  12/10/2013  10:19 AM

## 2013-12-10 NOTE — Telephone Encounter (Signed)
Gave pt appt for lab and Md  for june , eamiled michelle regarding chemo for 6/16

## 2013-12-11 ENCOUNTER — Inpatient Hospital Stay (HOSPITAL_COMMUNITY): Payer: Federal, State, Local not specified - PPO

## 2013-12-11 ENCOUNTER — Encounter (HOSPITAL_COMMUNITY): Payer: Self-pay

## 2013-12-11 ENCOUNTER — Inpatient Hospital Stay (HOSPITAL_COMMUNITY)
Admission: AD | Admit: 2013-12-11 | Discharge: 2013-12-15 | DRG: 374 | Disposition: A | Discharge status: Home / Self Care | Payer: Federal, State, Local not specified - PPO | Source: Ambulatory Visit | Attending: Internal Medicine | Admitting: Internal Medicine

## 2013-12-11 ENCOUNTER — Telehealth: Payer: Self-pay | Admitting: Oncology

## 2013-12-11 ENCOUNTER — Telehealth: Payer: Self-pay | Admitting: *Deleted

## 2013-12-11 DIAGNOSIS — C786 Secondary malignant neoplasm of retroperitoneum and peritoneum: Principal | ICD-10-CM

## 2013-12-11 DIAGNOSIS — IMO0002 Reserved for concepts with insufficient information to code with codable children: Secondary | ICD-10-CM

## 2013-12-11 DIAGNOSIS — C801 Malignant (primary) neoplasm, unspecified: Secondary | ICD-10-CM

## 2013-12-11 DIAGNOSIS — K56609 Unspecified intestinal obstruction, unspecified as to partial versus complete obstruction: Secondary | ICD-10-CM

## 2013-12-11 DIAGNOSIS — E119 Type 2 diabetes mellitus without complications: Secondary | ICD-10-CM

## 2013-12-11 DIAGNOSIS — K219 Gastro-esophageal reflux disease without esophagitis: Secondary | ICD-10-CM

## 2013-12-11 DIAGNOSIS — Z720 Tobacco use: Secondary | ICD-10-CM

## 2013-12-11 DIAGNOSIS — R112 Nausea with vomiting, unspecified: Secondary | ICD-10-CM

## 2013-12-11 DIAGNOSIS — G622 Polyneuropathy due to other toxic agents: Secondary | ICD-10-CM

## 2013-12-11 DIAGNOSIS — R634 Abnormal weight loss: Secondary | ICD-10-CM

## 2013-12-11 DIAGNOSIS — J449 Chronic obstructive pulmonary disease, unspecified: Secondary | ICD-10-CM

## 2013-12-11 DIAGNOSIS — C800 Disseminated malignant neoplasm, unspecified: Secondary | ICD-10-CM

## 2013-12-11 DIAGNOSIS — E785 Hyperlipidemia, unspecified: Secondary | ICD-10-CM

## 2013-12-11 DIAGNOSIS — I10 Essential (primary) hypertension: Secondary | ICD-10-CM

## 2013-12-11 DIAGNOSIS — R109 Unspecified abdominal pain: Secondary | ICD-10-CM

## 2013-12-11 DIAGNOSIS — D490 Neoplasm of unspecified behavior of digestive system: Secondary | ICD-10-CM

## 2013-12-11 DIAGNOSIS — F17200 Nicotine dependence, unspecified, uncomplicated: Secondary | ICD-10-CM

## 2013-12-11 DIAGNOSIS — M549 Dorsalgia, unspecified: Secondary | ICD-10-CM

## 2013-12-11 DIAGNOSIS — M1990 Unspecified osteoarthritis, unspecified site: Secondary | ICD-10-CM

## 2013-12-11 LAB — GLUCOSE, CAPILLARY
GLUCOSE-CAPILLARY: 88 mg/dL (ref 70–99)
GLUCOSE-CAPILLARY: 86 mg/dL (ref 70–99)
Glucose-Capillary: 109 mg/dL — ABNORMAL HIGH (ref 70–99)
Glucose-Capillary: 93 mg/dL (ref 70–99)

## 2013-12-11 LAB — CREATININE, SERUM
Creatinine, Ser: 1.04 mg/dL (ref 0.50–1.35)
GFR calc non Af Amer: 80 mL/min — ABNORMAL LOW (ref >90)

## 2013-12-11 LAB — CBC
HEMATOCRIT: 38.5 % — AB (ref 39.0–52.0)
Hemoglobin: 12.8 g/dL — ABNORMAL LOW (ref 13.0–17.0)
MCH: 29.5 pg (ref 26.0–34.0)
MCHC: 33.2 g/dL (ref 30.0–36.0)
MCV: 88.7 fL (ref 78.0–100.0)
Platelets: 197 10*3/uL (ref 150–400)
RBC: 4.34 MIL/uL (ref 4.22–5.81)
RDW: 13.5 % (ref 11.5–15.5)
WBC: 6.4 10*3/uL (ref 4.0–10.5)

## 2013-12-11 MED ORDER — SODIUM CHLORIDE 0.9 % IJ SOLN
10.0000 mL | INTRAMUSCULAR | Status: DC | PRN
  Administered 2013-12-15: 10 mL

## 2013-12-11 MED ORDER — OXYCODONE HCL 20 MG/ML PO CONC: 10.0000 mg | ORAL | Status: DC | PRN

## 2013-12-11 MED ORDER — PANTOPRAZOLE SODIUM 40 MG IV SOLR
40.0000 mg | Freq: Two times a day (BID) | INTRAVENOUS | Status: DC
  Administered 2013-12-11 – 2013-12-15 (×8): 40 mg via INTRAVENOUS
  Filled 2013-12-11 (×9): qty 40

## 2013-12-11 MED ORDER — ALBUTEROL SULFATE HFA 108 (90 BASE) MCG/ACT IN AERS: 2.0000 | INHALATION_SPRAY | Freq: Four times a day (QID) | RESPIRATORY_TRACT | Status: DC | PRN

## 2013-12-11 MED ORDER — ONDANSETRON HCL 4 MG/2ML IJ SOLN
4.0000 mg | Freq: Four times a day (QID) | INTRAMUSCULAR | Status: DC | PRN
  Administered 2013-12-14 – 2013-12-15 (×2): 4 mg via INTRAVENOUS
  Filled 2013-12-11 (×2): qty 2

## 2013-12-11 MED ORDER — ONDANSETRON HCL 4 MG PO TABS
4.0000 mg | ORAL_TABLET | Freq: Four times a day (QID) | ORAL | Status: DC | PRN
  Filled 2013-12-11: qty 1

## 2013-12-11 MED ORDER — ACETAMINOPHEN 650 MG RE SUPP: 650.0000 mg | Freq: Four times a day (QID) | RECTAL | Status: DC | PRN

## 2013-12-11 MED ORDER — MORPHINE SULFATE 4 MG/ML IJ SOLN
4.0000 mg | INTRAMUSCULAR | Status: DC | PRN
  Administered 2013-12-11: 4 mg via INTRAVENOUS
  Filled 2013-12-11: qty 1

## 2013-12-11 MED ORDER — INSULIN ASPART 100 UNIT/ML ~~LOC~~ SOLN
0.0000 [IU] | SUBCUTANEOUS | Status: DC
  Administered 2013-12-12: 2 [IU] via SUBCUTANEOUS
  Administered 2013-12-12 – 2013-12-14 (×6): 1 [IU] via SUBCUTANEOUS
  Administered 2013-12-14: 2 [IU] via SUBCUTANEOUS

## 2013-12-11 MED ORDER — SODIUM CHLORIDE 0.9 % IV SOLN
INTRAVENOUS | Status: DC
  Administered 2013-12-11: 15:00:00 via INTRAVENOUS

## 2013-12-11 MED ORDER — NICOTINE 21 MG/24HR TD PT24
21.0000 mg | MEDICATED_PATCH | Freq: Every day | TRANSDERMAL | Status: DC
  Administered 2013-12-11 – 2013-12-15 (×5): 21 mg via TRANSDERMAL
  Filled 2013-12-11 (×5): qty 1

## 2013-12-11 MED ORDER — LORAZEPAM 1 MG PO TABS
1.0000 mg | ORAL_TABLET | Freq: Every day | ORAL | Status: DC
  Administered 2013-12-11: 1 mg via ORAL
  Filled 2013-12-11: qty 1

## 2013-12-11 MED ORDER — ACETAMINOPHEN 325 MG PO TABS: 650.0000 mg | ORAL_TABLET | Freq: Four times a day (QID) | ORAL | Status: DC | PRN

## 2013-12-11 MED ORDER — IOHEXOL 300 MG/ML  SOLN
100.0000 mL | Freq: Once | INTRAMUSCULAR | Status: AC | PRN
  Administered 2013-12-11: 100 mL via INTRAVENOUS

## 2013-12-11 MED ORDER — IOHEXOL 300 MG/ML  SOLN
50.0000 mL | Freq: Once | INTRAMUSCULAR | Status: AC | PRN
  Administered 2013-12-11: 50 mL via ORAL

## 2013-12-11 MED ORDER — FENTANYL 75 MCG/HR TD PT72
75.0000 ug | MEDICATED_PATCH | TRANSDERMAL | Status: DC
  Administered 2013-12-11 – 2013-12-14 (×2): 75 ug via TRANSDERMAL
  Filled 2013-12-11 (×2): qty 1

## 2013-12-11 MED ORDER — LIDOCAINE-PRILOCAINE 2.5-2.5 % EX CREA
TOPICAL_CREAM | Freq: Once | CUTANEOUS | Status: AC
  Administered 2013-12-11: 14:00:00 via TOPICAL
  Filled 2013-12-11: qty 5

## 2013-12-11 MED ORDER — ALBUTEROL SULFATE (2.5 MG/3ML) 0.083% IN NEBU: 2.5000 mg | INHALATION_SOLUTION | Freq: Four times a day (QID) | RESPIRATORY_TRACT | Status: DC | PRN

## 2013-12-11 MED ORDER — DEXTROSE-NACL 5-0.9 % IV SOLN
INTRAVENOUS | Status: DC
  Administered 2013-12-11 – 2013-12-14 (×4): via INTRAVENOUS

## 2013-12-11 MED ORDER — SODIUM CHLORIDE 0.9 % IJ SOLN
10.0000 mL | Freq: Two times a day (BID) | INTRAMUSCULAR | Status: DC
  Administered 2013-12-11 – 2013-12-15 (×3): 10 mL

## 2013-12-11 MED ORDER — OXYCODONE HCL 5 MG PO TABS
5.0000 mg | ORAL_TABLET | ORAL | Status: DC | PRN
  Administered 2013-12-11: 5 mg via ORAL
  Filled 2013-12-11: qty 1

## 2013-12-11 MED ORDER — ENOXAPARIN SODIUM 40 MG/0.4ML ~~LOC~~ SOLN
40.0000 mg | SUBCUTANEOUS | Status: DC
  Administered 2013-12-11 – 2013-12-14 (×4): 40 mg via SUBCUTANEOUS
  Filled 2013-12-11 (×5): qty 0.4

## 2013-12-11 NOTE — Progress Notes (Signed)
IP PROGRESS NOTE  Subjective:   He was admitted today for evaluation of the small bowel obstruction noted on plain x-ray yesterday. He continues to have frequent belching and abdominal pain. He had a small diarrhea bowel movement this morning. No vomiting. No nausea at present. He has not eaten since having serial at lunch chest today.  Objective: Vital signs in last 24 hours: Blood pressure 126/73, pulse 49, temperature 98.3 F (36.8 C), temperature source Oral, resp. rate 15, height 5\' 9"  (1.753 m), weight 183 lb (83.008 kg), SpO2 99.00%.  Intake/Output from previous day:    Physical Exam:  Abdomen: Tender in the right abdomen, no discrete mass, no hepatomegaly, active bowel sounds, soft   Portacath/PICC-without erythema  Lab Results:  Recent Labs  12/10/13 0938 01-05-2014 1240  WBC 9.4 6.4  HGB 14.0 12.8*  HCT 42.1 38.5*  PLT 239 197    BMET  Recent Labs  12/10/13 0938 01/05/14 1240  NA 140  --   K 4.5  --   CO2 26  --   GLUCOSE 172*  --   BUN 8.4  --   CREATININE 1.0 1.04  CALCIUM 9.3  --     Studies/Results: Dg Abd 2 Views  01/05/14   CLINICAL DATA:  Followup small bowel obstruction.  EXAM: ABDOMEN - 2 VIEW  COMPARISON:  12/10/2013  FINDINGS: Small bowel dilation has mildly improved from the prior study, although there is still small bowel dilation and multiple air-fluid levels. This is consistent with mild improvement in a partial small bowel obstruction.  No free air.  Lung bases remain clear.  IMPRESSION: Less small bowel dilation seen consistent with mild improvement in a partial small bowel obstruction. There still findings, however, of bowel obstruction with milder dilated loops small bowel and persistent air-fluid levels.   Electronically Signed   By: Lajean Manes M.D.   On: January 05, 2014 13:27   Dg Abd 2 Views  12/10/2013   CLINICAL DATA:  Abdominal pain, nausea, vomiting and bloating. Colon cancer.  EXAM: ABDOMEN - 2 VIEW  COMPARISON:  CT chest abdomen  pelvis 09/01/2013.  FINDINGS: There are dilated loops of small bowel with air-fluid levels in the central abdomen. Minimal colonic gas. No rectal mass. Surgical clips in the right upper quadrant. Visualized lung bases are clear.  IMPRESSION: Bowel gas pattern is indicative of small bowel obstruction.   Electronically Signed   By: Lorin Picket M.D.   On: 12/10/2013 13:29    Medications: I have reviewed the patient's current medications.  Assessment/Plan: 1. 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 CT of the chest 11/18/2012 with borderline enlarged mediastinal nodes, gastrohepatic ligament nodes and omental/peritoneal nodularity. Negative upper endoscopy. Normal CEA on 12/13/2012. Status post cycle 1 FOLFOX 01/05/2013. Status post cycle 2 FOLFOX 12/25/2012. Status post cycle 3 on 01/15/2013. Cycle 4 on 01/29/2013; cycle 5 on 02/12/2013; cycle 6 on 02/26/2013. Restaging CT evaluation showed improvement. He completed cycle 8 on 03/26/2013. Oxaliplatin was discontinued following cycle 8 due to progressive neuropathy symptoms. He began maintenance Xeloda 04/24/2013. Xeloda dose reduced beginning with cycle 5 on 07/26/2013.  CA 19-9 increased at 297.4 on 09/01/2013  CTs 09/01/2013 with decreased chest and abdomen lymphadenopathy, increased omental nodularity  Cycle 1 of FOLFIRI 09/11/2013.  Cycle 2 FOLFIRI 10/09/2013.  Cycle 3 FOLFIRI 10/23/2013  Cycle 4 FOLFIRI 11/25/2013 2. Cecal/appendix mass noted at the time of exploratory laparotomy 11/13/2012. 3. COPD.  4. Pain secondary to carcinomatosis. 5. Chronic back pain. Stable.  6. Status post Port-A-Cath placement 12/02/2012. 7. Delayed nausea following cycle 1 FOLFOX. Aloxi was added with cycle 2. He had delayed nausea following cycle 2. Emend was added beginning with cycle 3. Prophylactic Decadron  added beginning with cycle 8. He did not take as prescribed. He again had delayed nausea. 8. Diarrhea following cycle 2 FOLFOX. Question related to 5-fluorouracil. The diarrhea improved with Lomotil. The 5-fluorouracil bolus was eliminated and the dose of the 5 fluorouracil infusion was reduced beginning with cycle 3.  9. Left mid back pain/tenderness just lateral to the spine. Question etiology. Duragesic patch increased to 75 mcg every 3 days. He takes oxycodone as needed. He continues to have back pain. 10. Anorexia/weight loss. He began a trial of Megace following office visit 01/08/2013. Appetite is better. No longer taking Megace.  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 Increased with moderate loss of vibratory sense at the fingertips per tuning fork exam and interfering with activity when here 04/09/2013. Oxaliplatin was discontinued. Improved. 12. Nausea/vomiting with cycle 1 Xeloda. He has persistent nausea while on Xeloda. Improved with dose reduction beginning with cycle 5. 13. Partial small bowel obstruction confirmed on plain x-ray 12/10/2013 after presenting with frequent belching and abdominal bloating  Recommendations:  1.n.p.o. except medications and sips of fluid  2.CT abdomen/pelvis with contrast  3.surgery consult  4. Continue narcotic analgesics for pain    [name] is admitted for evaluation of a bowel obstruction. The clinical history and plain x-ray are consistent with a partial small bowel obstruction. He understands this is most likely secondary to tumor.  I discussed the case with the surgical service. They will see him on 12/12/2013. We will obtain a CT of the abdomen and pelvis tonight.  Systemic treatment options are limited. If he is not a surgical candidate we will need to consider hospice care.  I will see him in the a.m. on 12/12/2013. I will then be out until  12/15/2013.  Please call oncology as needed. I appreciate the care from Dr. Rockne Menghini     LOS: 0 days   [facility], Dominica Severin  12/11/2013, 4:26 PM

## 2013-12-11 NOTE — Telephone Encounter (Signed)
Talked to pt and he is aware of appt on 12/16/13 lab,md and chemo

## 2013-12-11 NOTE — Progress Notes (Signed)
Direct admit, referred by Dr. Benay Spice 12/10/13 for admission today.  Has a SBO.  Dr. Benay Spice will follow.  RAMA,CHRISTINA 12/11/2013 11:20 AM

## 2013-12-11 NOTE — H&P (Signed)
History and Physical:    Frank Baird XKG:[id number] DOB: [birth year] DOA: 12/11/2013  Referring physician: Dr. Julieanne Manson PCP: Odette Fraction, MD   Chief Complaint: Belching, abdominal discomfort  History of Present Illness:   Frank Baird is an 53 y.o. male with a PMH of metastatic adenocarcinoma/abdominal carcinomatosis, under the care of Dr. Benay Spice, receiving FOLFIRI, who was seen by Dr. Benay Spice in the office 12/10/13 where he had complaints of 2 days of worsening nausea, belching, bloating, and some diarrhea. Dr. Benay Spice wanted him to come into the hospital, and he agreed to be directly admitted today. He says his belching began after his last chemotherapy.  Also complains of stabbing intermittent abdominal pain and nausea/vomiting (no hematemesis), bloating, passing flatus, last BM this morning (loose but no melena/hematochezia). Dr. Benay Spice ordered 2 views of the abdomen yesterday which showed findings consistent with a small bowel obstruction, prompting him to refer Frank Baird as a direct admission. He has taken oxycodone and uses a Duragesic patch for pain control.  ROS:   Constitutional: No fever, no chills;  Appetite diminished; weight "up and down", + fatigue.  HEENT: No blurry vision, no diplopia, no pharyngitis, no dysphagia CV: No chest pain, no palpitations, no PND, no orthopnea, no edema.  Resp: + occasional SOB after chemo, no cough, no pleuritic pain. GI: + nausea, + vomiting, + diarrhea, no melena, no hematochezia, no constipation, + abdominal pain.  GU: No dysuria, no hematuria, + frequency, no urgency. MSK: no myalgias, + chronic back arthralgias.  Neuro:  + occasional headache, no focal neurological deficits, no history of seizures.  Psych: + depression, + anxiety.  Endo: No heat intolerance, no cold intolerance, no polyuria, no polydipsia  Skin: No rashes, no skin lesions.  Heme: No easy bruising.  Travel history: No recent travel.   Past Medical History:    Past Medical History  Diagnosis Date  . RUQ pain   . Complication of anesthesia     difficulty waking up  . Arthritis   . GERD (gastroesophageal reflux disease)   . Diabetes mellitus   . Hyperlipidemia   . Hypertension   . History of gout   . COPD (chronic obstructive pulmonary disease)   . Anxiety   . Depression   . Cancer     colon ca    Past Surgical History:   Past Surgical History  Procedure Laterality Date  . Shoulder surgery  2012    right shoulder rotator cuff  . Nasal sinus surgery  2000  . Hernia repair      umbilical and right inguinal  . Elbow surgery      right elbow ligament  . Eye surgery      bilateral cataract surgery  . Cholecystectomy  09/26/2011    Procedure: LAPAROSCOPIC CHOLECYSTECTOMY WITH INTRAOPERATIVE CHOLANGIOGRAM;  Surgeon: Edward Jolly, MD;  Location: [place];  Service: General;  Laterality: N/A;  . Laparoscopic appendectomy N/A 11/12/2012    Procedure: DIAGNOSTIC LAPAROSCOPY, OPEN EXPLORATORY LAPAROTOMY, INCISIONAL BIOPSIES OF CALICFORM LIGAMENT, MESSENTERY, UMBILICAL LIGAMENT, BAND OF DUODENUM;  Surgeon: Madilyn Hook, DO;  Location: [facility];  Service: General;  Laterality: N/A;  . Portacath placement N/A 12/02/2012    Procedure: INSERTION PORT-A-CATH;  Surgeon: Madilyn Hook, DO;  Location: [facility];  Service: General;  Laterality: N/A;  with xray and ultrasound     Social History:   History   Social History  . Marital Status: Married    Spouse Name: Langley Gauss  Number of Children: 6  . Years of Education: N/A   Occupational History  .  [employer]   Social History Main Topics  . Smoking status: Current Every Day Smoker -- 1.00 packs/day  . Smokeless tobacco: Never Used  . Alcohol Use: Yes     Comment: rarely 0 to 1-2 per week  . Drug Use: No  . Sexual Activity: No   Other Topics Concern  . Not on file   Social History Narrative   Married.     Family history:   Family History  Problem Relation Age of Onset  . Heart disease  Mother     Allergies   Bee venom; Codeine; and Penicillins  Current Medications:   Prior to Admission medications   Medication Sig Start Date End Date Taking? Authorizing Provider  albuterol (PROVENTIL HFA;VENTOLIN HFA) 108 (90 BASE) MCG/ACT inhaler Inhale 2 puffs into the lungs every 6 (six) hours as needed for wheezing. 12/06/12  Yes Susy Frizzle, MD  diphenoxylate-atropine (LOMOTIL) 2.5-0.025 MG per tablet Take 1 tablet by mouth 4 (four) times daily as needed for diarrhea or loose stools. 01/08/13  Yes Owens Shark, NP  esomeprazole (NEXIUM) 40 MG capsule Take 1 capsule (40 mg total) by mouth daily before breakfast. 12/11/12  Yes Susy Frizzle, MD  fentaNYL (DURAGESIC - DOSED MCG/HR) 75 MCG/HR Place 1 patch (75 mcg total) onto the skin every 3 (three) days. 11/06/13  Yes Ladell Pier, MD  lidocaine-prilocaine (EMLA) cream Apply to port a cath site one hour prior to chemo. Do not rub in. Cover with plastic. 10/09/13  Yes Ladell Pier, MD  lisinopril (PRINIVIL,ZESTRIL) 20 MG tablet Take 20 mg by mouth daily.   Yes Historical Provider, MD  LORazepam (ATIVAN) 1 MG tablet Take 1 tablet (1 mg total) by mouth every 6 (six) hours as needed for anxiety. 12/10/13  Yes Ladell Pier, MD  metoCLOPramide (REGLAN) 10 MG tablet Take 1 tablet (10 mg total) by mouth 3 (three) times daily before meals. 06/02/13  Yes Ladell Pier, MD  ondansetron (ZOFRAN) 8 MG tablet Take 1 tablet (8 mg total) by mouth every 8 (eight) hours as needed for nausea. 12/25/12  Yes Owens Shark, NP  Oxycodone HCl 10 MG TABS Take 10 mg by mouth every 4 (four) hours as needed (pain).   Yes Historical Provider, MD  pioglitazone (ACTOS) 30 MG tablet Take 30 mg by mouth daily.   Yes Historical Provider, MD  nicotine (NICODERM CQ - DOSED IN MG/24 HOURS) 21 mg/24hr patch Place 21 mg onto the skin daily.    Historical Provider, MD    Physical Exam:   Filed Vitals:   12/11/13 1122 12/11/13 1518  BP: 121/71 126/73  Pulse: 49  49  Temp: 98.4 F (36.9 C) 98.3 F (36.8 C)  TempSrc: Oral Oral  Resp: 12 15  Height: 5\' 9"  (1.753 m)   Weight: 83.008 kg (183 lb)   SpO2: 99% 99%     Physical Exam: Blood pressure 126/73, pulse 49, temperature 98.3 F (36.8 C), temperature source Oral, resp. rate 15, height 5\' 9"  (1.753 m), weight 83.008 kg (183 lb), SpO2 99.00%. Gen: No acute distress. Head: Normocephalic, atraumatic. Eyes: PERRL, EOMI, sclerae nonicteric. Mouth: Oropharynx clear. Neck: Supple, no thyromegaly, no lymphadenopathy, no jugular venous distention. Chest: Lungs diminished in the bases. CV: Heart sounds are regular. No murmurs, rubs, or gallops. Abdomen: Soft, tender right upper quadrant, nondistended with hyperactive bowel sounds. Extremities: Extremities without  clubbing, edema, cyanosis. Skin: Warm and dry. Neuro: Alert and oriented times 3; cranial nerves II through XII grossly intact. Psych: Mood and affect normal.   Data Review:    Labs: Basic Metabolic Panel:  Recent Labs Lab 12/10/13 0938 12/11/13 1240  NA 140  --   K 4.5  --   CO2 26  --   GLUCOSE 172*  --   BUN 8.4  --   CREATININE 1.0 1.04  CALCIUM 9.3  --    Liver Function Tests:  Recent Labs Lab 12/10/13 0938  AST 14  ALT 20  ALKPHOS 71  BILITOT 0.36  PROT 6.7  ALBUMIN 3.6   CBC:  Recent Labs Lab 12/10/13 0938 12/11/13 1240  WBC 9.4 6.4  NEUTROABS 6.5  --   HGB 14.0 12.8*  HCT 42.1 38.5*  MCV 90.9 88.7  PLT 239 197    Radiographic Studies: Dg Abd 2 Views  12/11/2013   CLINICAL DATA:  Followup small bowel obstruction.  EXAM: ABDOMEN - 2 VIEW  COMPARISON:  12/10/2013  FINDINGS: Small bowel dilation has mildly improved from the prior study, although there is still small bowel dilation and multiple air-fluid levels. This is consistent with mild improvement in a partial small bowel obstruction.  No free air.  Lung bases remain clear.  IMPRESSION: Less small bowel dilation seen consistent with mild  improvement in a partial small bowel obstruction. There still findings, however, of bowel obstruction with milder dilated loops small bowel and persistent air-fluid levels.   Electronically Signed   By: Lajean Manes M.D.   On: 12/11/2013 13:27   Dg Abd 2 Views  12/10/2013   CLINICAL DATA:  Abdominal pain, nausea, vomiting and bloating. Colon cancer.  EXAM: ABDOMEN - 2 VIEW  COMPARISON:  CT chest abdomen pelvis 09/01/2013.  FINDINGS: There are dilated loops of small bowel with air-fluid levels in the central abdomen. Minimal colonic gas. No rectal mass. Surgical clips in the right upper quadrant. Visualized lung bases are clear.  IMPRESSION: Bowel gas pattern is indicative of small bowel obstruction.   Electronically Signed   By: Lorin Picket M.D.   On: 12/10/2013 13:29     Assessment/Plan:   Principal Problem: SBO (small bowel obstruction)  Likely a malignant obstruction, but with carcinomatosis, may not be a surgical candidate.  Bowel obstruction is at least only partial, given that he has had a bowel movement and is passing flatus.  We'll repeat x-rays in the morning, and consider surgical referral if ongoing obstruction without evidence of resolution noted.  Maintain bowel rest for now.  Active Problems: Type 2 diabetes mellitus  Hold Actos. SSI every 4 hours ordered.  Hypertension  Hold lisinopril.  Carcinomatosis  Dr. Benay Spice is aware of the patient's admission and will follow.  Tobacco abuse  Nicotine patch ordered.  DVT prophylaxis  Lovenox ordered.  Code Status: Full. Family Communication: Burt Piatek, wife [phone]) Disposition Plan: Home when stable.  Time spent: One hour.  RAMA,CHRISTINA [facility] Pager [phone] Cell: [phone]   If 7PM-7AM, please contact night-coverage [email or web address] Password [facility] 12/11/2013, 4:47 PM    **Disclaimer: This note was dictated with voice recognition software. Similar sounding words can  inadvertently be transcribed and this note may contain transcription errors which may not have been corrected upon publication of note.**

## 2013-12-11 NOTE — Progress Notes (Signed)
Attempted to access Port a cath x 1 without success, could not flush, no blood return. Will page IV team, patient did report that it had been difficult at the cancer center recently. Will continue to monitor.

## 2013-12-11 NOTE — Telephone Encounter (Signed)
Arranged for admission today for bowel obstruction at [facility]. Beds are ready-come to admitting now. Patient notified and agrees. Dr. Rockne Menghini to be paged upon arrival.

## 2013-12-12 ENCOUNTER — Inpatient Hospital Stay (HOSPITAL_COMMUNITY): Payer: Federal, State, Local not specified - PPO

## 2013-12-12 DIAGNOSIS — K56609 Unspecified intestinal obstruction, unspecified as to partial versus complete obstruction: Secondary | ICD-10-CM

## 2013-12-12 DIAGNOSIS — M129 Arthropathy, unspecified: Secondary | ICD-10-CM

## 2013-12-12 LAB — BASIC METABOLIC PANEL
BUN: 7 mg/dL (ref 6–23)
CALCIUM: 9 mg/dL (ref 8.4–10.5)
CO2: 25 mEq/L (ref 19–32)
Chloride: 102 mEq/L (ref 96–112)
Creatinine, Ser: 0.8 mg/dL (ref 0.50–1.35)
Glucose, Bld: 122 mg/dL — ABNORMAL HIGH (ref 70–99)
Potassium: 3.7 mEq/L (ref 3.7–5.3)
SODIUM: 139 meq/L (ref 137–147)

## 2013-12-12 LAB — CBC
HCT: 39.4 % (ref 39.0–52.0)
Hemoglobin: 13.4 g/dL (ref 13.0–17.0)
MCH: 29.8 pg (ref 26.0–34.0)
MCHC: 34 g/dL (ref 30.0–36.0)
MCV: 87.8 fL (ref 78.0–100.0)
PLATELETS: 181 10*3/uL (ref 150–400)
RBC: 4.49 MIL/uL (ref 4.22–5.81)
RDW: 13.3 % (ref 11.5–15.5)
WBC: 4.6 10*3/uL (ref 4.0–10.5)

## 2013-12-12 LAB — GLUCOSE, CAPILLARY
Glucose-Capillary: 102 mg/dL — ABNORMAL HIGH (ref 70–99)
Glucose-Capillary: 129 mg/dL — ABNORMAL HIGH (ref 70–99)
Glucose-Capillary: 147 mg/dL — ABNORMAL HIGH (ref 70–99)
Glucose-Capillary: 111 mg/dL — ABNORMAL HIGH (ref 70–99)
Glucose-Capillary: 103 mg/dL — ABNORMAL HIGH (ref 70–99)

## 2013-12-12 MED ORDER — OXYCODONE HCL 20 MG/ML PO CONC: 10.0000 mg | ORAL | Status: DC | PRN

## 2013-12-12 MED ORDER — HYDROMORPHONE HCL PF 1 MG/ML IJ SOLN
1.0000 mg | INTRAMUSCULAR | Status: DC | PRN
  Administered 2013-12-12 – 2013-12-13 (×4): 1 mg via INTRAVENOUS
  Administered 2013-12-13: 2 mg via INTRAVENOUS
  Administered 2013-12-13: 1 mg via INTRAVENOUS
  Administered 2013-12-14 (×5): 2 mg via INTRAVENOUS
  Administered 2013-12-15: 1 mg via INTRAVENOUS
  Filled 2013-12-12 (×12): qty 2

## 2013-12-12 MED ORDER — LORAZEPAM 2 MG/ML IJ SOLN
1.0000 mg | Freq: Four times a day (QID) | INTRAMUSCULAR | Status: DC | PRN
  Administered 2013-12-12 – 2013-12-14 (×3): 1 mg via INTRAVENOUS
  Filled 2013-12-12 (×3): qty 1

## 2013-12-12 MED ORDER — LORAZEPAM 1 MG PO TABS
2.0000 mg | ORAL_TABLET | Freq: Once | ORAL | Status: AC
  Administered 2013-12-12: 2 mg via ORAL
  Filled 2013-12-12: qty 2

## 2013-12-12 NOTE — Progress Notes (Signed)
Patient ID: Frank Baird, male   DOB: 11/17/1960, 53 y.o.   MRN: [id number]  [facility] PROGRESS NOTE  JOHNSON Baird QQP:[id number] DOB: June 07, 1961 DOA: 12/11/2013 PCP: Odette Fraction, MD  Brief narrative: 53 y.o. male with a PMH of metastatic adenocarcinoma/abdominal carcinomatosis, under the care of Dr. Benay Spice, receiving FOLFIRI, who was seen by Dr. Benay Spice in the office 12/10/13 where Baird had complaints of 2 days of worsening nausea, belching, bloating, and some diarrhea. Dr. Benay Spice wanted him to come into the hospital, and Baird agreed to be directly admitted 6/11. Baird says his belching began after his last chemotherapy. Also complains of stabbing, intermittent abdominal pain, nausea/vomiting (no hematemesis), bloating, passing flatus, last BM morning of Baird (loose but no melena/hematochezia). Dr. Benay Spice ordered 2 views of the abdomen yesterday which showed findings consistent with a small bowel obstruction, prompting him to refer Frank Baird has taken oxycodone and uses a Duragesic patch for pain control.   Principal Problem:   SBO (small bowel obstruction) - pt still in pain but says Baird is tolerating clear liquids well - continue analgesia as needed - appreciate surgery and oncology assistance  Active Problems:   Type 2 diabetes mellitus - reasonable inpatient control    PSA - including alcohol and tobacco - pt tells me Baird is still smoking and drinking in order to help with chronic pain - monitor for signs of withdrawal    Hypertension - reasonable inpatient control    Carcinomatosis - appreciate oncology input    Moderate to severe malnutrition - pt lost over 100 lbs in the past few years due to malignancy - considering palliative care    Consultants:  Surgery  Oncology   Procedures/Studies: Ct Abdomen Pelvis W Contrast  12/12/2013 Changes of likely abdominal and pelvic peritoneal carcinomatosis with small amount of free fluid  around the liver and in the pelvis, numerous nonenlarged mesenteric lymph nodes, infiltration and nodularity in the omentum and mesenteric, and with infiltration into the abdominal wall at the umbilicus. Prominent lymph nodes in the lower chest. Mesenteric infiltration appears to be mildly progressed since previous study. Borderline prominence of small bowel without definitive evidence of obstruction. Changes could be due to partial obstruction or ileus.     Dg Abd 2 Views  12/11/2013  Less small bowel dilation seen consistent with mild improvement in a partial small bowel obstruction. There still findings, however, of bowel obstruction with milder dilated loops small bowel and persistent air-fluid levels.   Dg Abd 2 Views  12/10/2013   Bowel gas pattern is indicative of small bowel obstruction.    Antibiotics:  None  Code Status: Full Family Communication: Pt and wife at bedside Disposition Plan: Home when medically stable  HPI/Subjective: No events overnight.   Objective: Filed Vitals:   12/11/13 1122 12/11/13 1518 12/11/13 2044 12/12/13 0443  BP: 121/71 126/73 157/75 130/72  Pulse: 49 49 55 51  Temp: 98.4 F (36.9 C) 98.3 F (36.8 C) 98.6 F (37 C) 98.1 F (36.7 C)  TempSrc: Oral Oral Oral Oral  Resp: 12 15 18 14   Height: 5\' 9"  (1.753 m)     Weight: 83.008 kg (183 lb)     SpO2: 99% 99% 100% 100%    Intake/Output Summary (Last 24 hours) at 12/12/13 0713 Last data filed at 12/11/13 2045  Gross per 24 hour  Intake      0 ml  Output      0 ml  Net  0 ml    Exam:   General:  Pt is alert, follows commands appropriately, not in acute distress  Cardiovascular: Regular rate and rhythm, S1/S2, no murmurs, no rubs, no gallops  Respiratory: Clear to auscultation bilaterally, no wheezing, no crackles, no rhonchi  Abdomen: Soft, diffusely tender, non distended, bowel sounds present, no guarding  Extremities: No edema, pulses DP and PT palpable bilaterally  Neuro: Grossly  nonfocal  Data Reviewed: Basic Metabolic Panel:  Recent Labs Lab 12/10/13 0938 12/11/13 1240  NA 140  --   K 4.5  --   CO2 26  --   GLUCOSE 172*  --   BUN 8.4  --   CREATININE 1.0 1.04  CALCIUM 9.3  --    Liver Function Tests:  Recent Labs Lab 12/10/13 0938  AST 14  ALT 20  ALKPHOS 71  BILITOT 0.36  PROT 6.7  ALBUMIN 3.6   CBC:  Recent Labs Lab 12/10/13 0938 12/11/13 1240  WBC 9.4 6.4  NEUTROABS 6.5  --   HGB 14.0 12.8*  HCT 42.1 38.5*  MCV 90.9 88.7  PLT 239 197   CBG:  Recent Labs Lab 12/11/13 1253 12/11/13 1713 12/11/13 1958 12/11/13 2359 12/12/13 0422  GLUCAP 109* 88 86 93 102*   Scheduled Meds: . enoxaparin (LOVENOX) injection  40 mg Subcutaneous Q24H  . fentaNYL  75 mcg Transdermal Q72H  . insulin aspart  0-9 Units Subcutaneous 6 times per day  . LORazepam  1 mg Oral QHS  . nicotine  21 mg Transdermal Daily  . pantoprazole (PROTONIX) IV  40 mg Intravenous Q12H  . sodium chloride  10-40 mL Intracatheter Q12H   Continuous Infusions: . dextrose 5 % and 0.9% NaCl 50 mL/hr at 12/11/13 1742     Faye Ramsay, MD  [facility] Pager [phone]  If 7PM-7AM, please contact night-coverage [email or web address] Password [email or web address] 12/12/2013, 7:13 AM   LOS: 1 day

## 2013-12-12 NOTE — Consult Note (Signed)
Reason for Consult:  Partial small bowel obstruction Referring Physician: Dr. Davene Costain Frank Baird is an 53 y.o. male.  HPI: This is a 53 year old male who had perforated appendicitis in May 2014. 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 Final pathology was consistent with an upper gastrointestinal or pancreatic or biliary source.  He has been treated with chemotherapy and has had relatively good clinical results. Recently however he developed some cramping and abdominal bloating after eating. He belches a lot. Still passing gas and having some small bowel movements. He was admitted because of progression of the symptoms. CT scan was performed last night and is as below. No vomiting.  Past Medical History  Diagnosis Date  . RUQ pain   . Complication of anesthesia     difficulty waking up  . Arthritis   . GERD (gastroesophageal reflux disease)   . Diabetes mellitus   . Hyperlipidemia   . Hypertension   . History of gout   . COPD (chronic obstructive pulmonary disease)   . Anxiety   . Depression   . Cancer     colon ca    Past Surgical History  Procedure Laterality Date  . Shoulder surgery  2012    right shoulder rotator cuff  . Nasal sinus surgery  2000  . Hernia repair      umbilical and right inguinal  . Elbow surgery      right elbow ligament  . Eye surgery      bilateral cataract surgery  . Cholecystectomy  09/26/2011    Procedure: LAPAROSCOPIC CHOLECYSTECTOMY WITH INTRAOPERATIVE CHOLANGIOGRAM;  Surgeon: Edward Jolly, MD;  Location: [facility];  Service: General;  Laterality: N/A;  . Laparoscopic appendectomy N/A 11/12/2012    Procedure: DIAGNOSTIC LAPAROSCOPY, OPEN EXPLORATORY LAPAROTOMY, INCISIONAL BIOPSIES OF CALICFORM LIGAMENT, MESSENTERY, UMBILICAL LIGAMENT, BAND OF DUODENUM;  Surgeon: Madilyn Hook, DO;  Location: [facility];  Service: General;  Laterality: N/A;  . Portacath placement N/A 12/02/2012    Procedure: INSERTION PORT-A-CATH;   Surgeon: Madilyn Hook, DO;  Location: [facility];  Service: General;  Laterality: N/A;  with xray and ultrasound     Family History  Problem Relation Age of Onset  . Heart disease Mother     Social History:  reports that he has been smoking.  He has never used smokeless tobacco. He reports that he drinks alcohol. He reports that he does not use illicit drugs.  Allergies:  Allergies  Allergen Reactions  . Bee Venom     Unsure of reaction, Stung as baby and almost died  . Codeine     Bad stomach cramps    . Penicillins Other (See Comments)    unknown    Prior to Admission medications   Medication Sig Start Date End Date Taking? Authorizing Provider  albuterol (PROVENTIL HFA;VENTOLIN HFA) 108 (90 BASE) MCG/ACT inhaler Inhale 2 puffs into the lungs every 6 (six) hours as needed for wheezing. 12/06/12  Yes Susy Frizzle, MD  diphenoxylate-atropine (LOMOTIL) 2.5-0.025 MG per tablet Take 1 tablet by mouth 4 (four) times daily as needed for diarrhea or loose stools. 01/08/13  Yes Owens Shark, NP  esomeprazole (NEXIUM) 40 MG capsule Take 1 capsule (40 mg total) by mouth daily before breakfast. 12/11/12  Yes Susy Frizzle, MD  fentaNYL (DURAGESIC - DOSED MCG/HR) 75 MCG/HR Place 1 patch (75 mcg total) onto the skin every 3 (three) days. 11/06/13  Yes Ladell Pier, MD  lidocaine-prilocaine (  EMLA) cream Apply to port a cath site one hour prior to chemo. Do not rub in. Cover with plastic. 10/09/13  Yes Ladell Pier, MD  lisinopril (PRINIVIL,ZESTRIL) 20 MG tablet Take 20 mg by mouth daily.   Yes Historical Provider, MD  LORazepam (ATIVAN) 1 MG tablet Take 1 tablet (1 mg total) by mouth every 6 (six) hours as needed for anxiety. 12/10/13  Yes Ladell Pier, MD  metoCLOPramide (REGLAN) 10 MG tablet Take 1 tablet (10 mg total) by mouth 3 (three) times daily before meals. 06/02/13  Yes Ladell Pier, MD  ondansetron (ZOFRAN) 8 MG tablet Take 1 tablet (8 mg total) by mouth every 8 (eight) hours as  needed for nausea. 12/25/12  Yes Owens Shark, NP  Oxycodone HCl 10 MG TABS Take 10 mg by mouth every 4 (four) hours as needed (pain).   Yes Historical Provider, MD  pioglitazone (ACTOS) 30 MG tablet Take 30 mg by mouth daily.   Yes Historical Provider, MD  nicotine (NICODERM CQ - DOSED IN MG/24 HOURS) 21 mg/24hr patch Place 21 mg onto the skin daily.    Historical Provider, MD     Results for orders placed during the hospital encounter of 12/11/13 (from the past 48 hour(s))  CBC     Status: Abnormal   Collection Time    12/11/13 12:40 PM      Result Value Ref Range   WBC 6.4  4.0 - 10.5 K/uL   RBC 4.34  4.22 - 5.81 MIL/uL   Hemoglobin 12.8 (*) 13.0 - 17.0 g/dL   HCT 38.5 (*) 39.0 - 52.0 %   MCV 88.7  78.0 - 100.0 fL   MCH 29.5  26.0 - 34.0 pg   MCHC 33.2  30.0 - 36.0 g/dL   RDW 13.5  11.5 - 15.5 %   Platelets 197  150 - 400 K/uL  CREATININE, SERUM     Status: Abnormal   Collection Time    12/11/13 12:40 PM      Result Value Ref Range   Creatinine, Ser 1.04  0.50 - 1.35 mg/dL   GFR calc non Af Amer 80 (*) >90 mL/min   GFR calc Af Amer >90  >90 mL/min   Comment: (NOTE)     The eGFR has been calculated using the CKD EPI equation.     This calculation has not been validated in all clinical situations.     eGFR's persistently <90 mL/min signify possible Chronic Kidney     Disease.  GLUCOSE, CAPILLARY     Status: Abnormal   Collection Time    12/11/13 12:53 PM      Result Value Ref Range   Glucose-Capillary 109 (*) 70 - 99 mg/dL   Comment 1 Notify RN     Comment 2 Documented in Chart    GLUCOSE, CAPILLARY     Status: None   Collection Time    12/11/13  5:13 PM      Result Value Ref Range   Glucose-Capillary 88  70 - 99 mg/dL   Comment 1 Documented in Chart     Comment 2 Notify RN    GLUCOSE, CAPILLARY     Status: None   Collection Time    12/11/13  7:58 PM      Result Value Ref Range   Glucose-Capillary 86  70 - 99 mg/dL   Comment 1 Notify RN    GLUCOSE, CAPILLARY      Status: None  Collection Time    12/11/13 11:59 PM      Result Value Ref Range   Glucose-Capillary 93  70 - 99 mg/dL   Comment 1 Notify RN    GLUCOSE, CAPILLARY     Status: Abnormal   Collection Time    12/12/13  4:22 AM      Result Value Ref Range   Glucose-Capillary 102 (*) 70 - 99 mg/dL   Comment 1 Notify RN    BASIC METABOLIC PANEL     Status: Abnormal   Collection Time    12/12/13  8:10 AM      Result Value Ref Range   Sodium 139  137 - 147 mEq/L   Potassium 3.7  3.7 - 5.3 mEq/L   Chloride 102  96 - 112 mEq/L   CO2 25  19 - 32 mEq/L   Glucose, Bld 122 (*) 70 - 99 mg/dL   BUN 7  6 - 23 mg/dL   Creatinine, Ser 0.80  0.50 - 1.35 mg/dL   Calcium 9.0  8.4 - 10.5 mg/dL   GFR calc non Af Amer >90  >90 mL/min   GFR calc Af Amer >90  >90 mL/min   Comment: (NOTE)     The eGFR has been calculated using the CKD EPI equation.     This calculation has not been validated in all clinical situations.     eGFR's persistently <90 mL/min signify possible Chronic Kidney     Disease.  CBC     Status: None   Collection Time    12/12/13  8:10 AM      Result Value Ref Range   WBC 4.6  4.0 - 10.5 K/uL   RBC 4.49  4.22 - 5.81 MIL/uL   Hemoglobin 13.4  13.0 - 17.0 g/dL   HCT 39.4  39.0 - 52.0 %   MCV 87.8  78.0 - 100.0 fL   MCH 29.8  26.0 - 34.0 pg   MCHC 34.0  30.0 - 36.0 g/dL   RDW 13.3  11.5 - 15.5 %   Platelets 181  150 - 400 K/uL  GLUCOSE, CAPILLARY     Status: Abnormal   Collection Time    12/12/13  8:15 AM      Result Value Ref Range   Glucose-Capillary 129 (*) 70 - 99 mg/dL   Comment 1 Documented in Chart     Comment 2 Notify RN      Ct Abdomen Pelvis W Contrast  12/12/2013   CLINICAL DATA:  Several days of abdominal distention, belching, and abdominal pain. History of colon cancer currently on chemotherapy.  EXAM: CT ABDOMEN AND PELVIS WITH CONTRAST  TECHNIQUE: Multidetector CT imaging of the abdomen and pelvis was performed using the standard protocol following bolus  administration of intravenous contrast.  CONTRAST:  54mL OMNIPAQUE IOHEXOL 300 MG/ML SOLN, 155mL OMNIPAQUE IOHEXOL 300 MG/ML SOLN  COMPARISON:  09/01/2013  FINDINGS: Mild dependent changes in the lung bases. Right pericardial lymph nodes are mildly enlarged, similar to prior study. Distal paraesophageal lymph nodes are also unchanged.  Free fluid around the liver edge and extending along the right pericolic gutter. This is similar to prior study. Nodular infiltrative changes in the omentum and mesentery likely represent peritoneal carcinomatosis. Mesenteric changes appear slightly more prominent than on prior study. No focal liver lesions are identified. Surgical absence of the gallbladder. Mild dilatation of bile ducts is probably physiologic after cholecystectomy. Spleen size is normal. No adrenal gland nodules. Kidneys appear symmetrical  without mass or hydronephrosis. Inferior vena cava and abdominal aorta are normal allowing for vascular calcifications. Pancreas is unremarkable. Retroperitoneal lymph nodes are not pathologically enlarged. Mesenteric lymph nodes are numerous without pathologic enlargement and probably metastatic. No free air in the abdomen. Nonspecific infiltration in the umbilicus could represent tumor infiltration oral postoperative scarring. This is unchanged. Gastric wall is not thickened. Proximal small bowel are mildly prominent without discrete dilatation. Fluid filled distal small bowel. Partial obstruction or ileus are not excluded. Appearance is similar to prior study. Stool-filled colon without distention.  Pelvis: Prostate gland is not enlarged. Bladder wall is not thickened. Small amount of free fluid in the pelvis. Pelvic lymph nodes are moderately prominent without pathologic enlargement. This is stable. Sigmoid colonic wall thickening demonstrated on prior study is less well visualized today due to degree of distention. No diverticulitis. Appendix is normal. No destructive or  expansile bone lesions are appreciated.  IMPRESSION: Changes of likely abdominal and pelvic peritoneal carcinomatosis with small amount of free fluid around the liver and in the pelvis, numerous nonenlarged mesenteric lymph nodes, infiltration and nodularity in the omentum and mesenteric, and with infiltration into the abdominal wall at the umbilicus. Prominent lymph nodes in the lower chest. Mesenteric infiltration appears to be mildly progressed since previous study. Borderline prominence of small bowel without definitive evidence of obstruction. Changes could be due to partial obstruction or ileus.   Electronically Signed   By: Lucienne Capers M.D.   On: 12/12/2013 02:08   Dg Abd 2 Views  12/12/2013   CLINICAL DATA:  53 year old male with abdominal distention, known peritoneal carcinomatosis, suspected bowel obstruction. Initial encounter.  EXAM: ABDOMEN - 2 VIEW  COMPARISON:  CT Abdomen and Pelvis 12/11/2013.  FINDINGS: Upright view of the abdomen at 0800 hrs. No pneumoperitoneum. Stable right upper quadrant surgical clips. Minor lung base atelectasis.  The oral contrast administered yesterday has now reached the rectum, and is primarily distributed within the colon. Increased colonic gas. Still, several dilated gas distended loops are re- identified in the mid abdomen and right upper quadrant, up to 5 cm diameter. These are stable. Stable visualized osseous structures.  IMPRESSION: Partial small bowel obstruction, with interval transit of the oral contrast administered yesterday to the distal colon. No free air.  Study reviewed in person with Dr. Julieanne Manson on 12/12/2013 at 0850 hrs.   Electronically Signed   By: Lars Pinks M.D.   On: 12/12/2013 08:55   Dg Abd 2 Views  12/11/2013   CLINICAL DATA:  Followup small bowel obstruction.  EXAM: ABDOMEN - 2 VIEW  COMPARISON:  12/10/2013  FINDINGS: Small bowel dilation has mildly improved from the prior study, although there is still small bowel dilation and  multiple air-fluid levels. This is consistent with mild improvement in a partial small bowel obstruction.  No free air.  Lung bases remain clear.  IMPRESSION: Less small bowel dilation seen consistent with mild improvement in a partial small bowel obstruction. There still findings, however, of bowel obstruction with milder dilated loops small bowel and persistent air-fluid levels.   Electronically Signed   By: Lajean Manes M.D.   On: 12/11/2013 13:27   Dg Abd 2 Views  12/10/2013   CLINICAL DATA:  Abdominal pain, nausea, vomiting and bloating. Colon cancer.  EXAM: ABDOMEN - 2 VIEW  COMPARISON:  CT chest abdomen pelvis 09/01/2013.  FINDINGS: There are dilated loops of small bowel with air-fluid levels in the central abdomen. Minimal colonic gas. No rectal mass. Surgical  clips in the right upper quadrant. Visualized lung bases are clear.  IMPRESSION: Bowel gas pattern is indicative of small bowel obstruction.   Electronically Signed   By: Lorin Picket M.D.   On: 12/10/2013 13:29    Review of Systems  Constitutional: Negative for fever and chills.  Gastrointestinal: Positive for heartburn and abdominal pain. Negative for vomiting and constipation.  Neurological: Negative for focal weakness.   Blood pressure 130/72, pulse 51, temperature 98.1 F (36.7 C), temperature source Oral, resp. rate 14, height $RemoveBe'5\' 9"'XmXrfDUFT$  (1.753 m), weight 183 lb (83.008 kg), SpO2 100.00%. Physical Exam  Constitutional: He appears well-developed and well-nourished. No distress.  HENT:  Head: Normocephalic and atraumatic.  Multiple teeth are missing.  GI: Soft. He exhibits distension (Mild). He exhibits no mass. There is tenderness (Mild in left upper quadrant). There is no rebound.  Midline scar  Neurological: He is alert.  Skin: Skin is warm and dry.  Psychiatric: He has a normal mood and affect. His behavior is normal.    Assessment/Plan: Partial small bowel structure most likely secondary to progressive carcinomatosis.  Very likely, there are multiple points of intestinal involvement.. Contrast from CT scan done last night is already through to the rectum. No obvious transition point on the CT scan.  Plan: I do not recommend any operative intervention at this time. Would try him on a liquid diet and try to modify his diet to decrease his symptoms.  Jentzen Minasyan J 12/12/2013, 9:44 AM

## 2013-12-12 NOTE — Progress Notes (Signed)
IP PROGRESS NOTE  Subjective:   He continues to have abdominal pain. Minimal flatus. No emesis. He is tolerating clear liquids.  Objective: Vital signs in last 24 hours: Blood pressure 130/72, pulse 51, temperature 98.1 F (36.7 C), temperature source Oral, resp. rate 14, height 5\' 9"  (1.753 m), weight 183 lb (83.008 kg), SpO2 100.00%.  Intake/Output from previous day: 06/11 0701 - 06/12 0700 In: 800 [P.O.:800] Out: -   Physical Exam: Lungs: Clear bilaterally Cardiac: Regular rate and rhythm Abdomen: Tender in the bilateral abdomen, no discrete mass, no hepatomegaly, active bowel sounds, soft Vascular: No leg edema   Portacath/PICC-without erythema  Lab Results:  Recent Labs  12/11/13 1240 12/12/13 0810  WBC 6.4 4.6  HGB 12.8* 13.4  HCT 38.5* 39.4  PLT 197 181    BMET  Recent Labs  12/10/13 0938 12/11/13 1240 12/12/13 0810  NA 140  --  139  K 4.5  --  3.7  CL  --   --  102  CO2 26  --  25  GLUCOSE 172*  --  122*  BUN 8.4  --  7  CREATININE 1.0 1.04 0.80  CALCIUM 9.3  --  9.0    Studies/Results: Ct Abdomen Pelvis W Contrast  12/12/2013   CLINICAL DATA:  Several days of abdominal distention, belching, and abdominal pain. History of colon cancer currently on chemotherapy.  EXAM: CT ABDOMEN AND PELVIS WITH CONTRAST  TECHNIQUE: Multidetector CT imaging of the abdomen and pelvis was performed using the standard protocol following bolus administration of intravenous contrast.  CONTRAST:  2mL OMNIPAQUE IOHEXOL 300 MG/ML SOLN, 139mL OMNIPAQUE IOHEXOL 300 MG/ML SOLN  COMPARISON:  09/01/2013  FINDINGS: Mild dependent changes in the lung bases. Right pericardial lymph nodes are mildly enlarged, similar to prior study. Distal paraesophageal lymph nodes are also unchanged.  Free fluid around the liver edge and extending along the right pericolic gutter. This is similar to prior study. Nodular infiltrative changes in the omentum and mesentery likely represent peritoneal  carcinomatosis. Mesenteric changes appear slightly more prominent than on prior study. No focal liver lesions are identified. Surgical absence of the gallbladder. Mild dilatation of bile ducts is probably physiologic after cholecystectomy. Spleen size is normal. No adrenal gland nodules. Kidneys appear symmetrical without mass or hydronephrosis. Inferior vena cava and abdominal aorta are normal allowing for vascular calcifications. Pancreas is unremarkable. Retroperitoneal lymph nodes are not pathologically enlarged. Mesenteric lymph nodes are numerous without pathologic enlargement and probably metastatic. No free air in the abdomen. Nonspecific infiltration in the umbilicus could represent tumor infiltration oral postoperative scarring. This is unchanged. Gastric wall is not thickened. Proximal small bowel are mildly prominent without discrete dilatation. Fluid filled distal small bowel. Partial obstruction or ileus are not excluded. Appearance is similar to prior study. Stool-filled colon without distention.  Pelvis: Prostate gland is not enlarged. Bladder wall is not thickened. Small amount of free fluid in the pelvis. Pelvic lymph nodes are moderately prominent without pathologic enlargement. This is stable. Sigmoid colonic wall thickening demonstrated on prior study is less well visualized today due to degree of distention. No diverticulitis. Appendix is normal. No destructive or expansile bone lesions are appreciated.  IMPRESSION: Changes of likely abdominal and pelvic peritoneal carcinomatosis with small amount of free fluid around the liver and in the pelvis, numerous nonenlarged mesenteric lymph nodes, infiltration and nodularity in the omentum and mesenteric, and with infiltration into the abdominal wall at the umbilicus. Prominent lymph nodes in the lower chest. Mesenteric infiltration  appears to be mildly progressed since previous study. Borderline prominence of small bowel without definitive evidence  of obstruction. Changes could be due to partial obstruction or ileus.   Electronically Signed   By: Lucienne Capers M.D.   On: 12/12/2013 02:08   Dg Abd 2 Views  12/12/2013   CLINICAL DATA:  53 year old male with abdominal distention, known peritoneal carcinomatosis, suspected bowel obstruction. Initial encounter.  EXAM: ABDOMEN - 2 VIEW  COMPARISON:  CT Abdomen and Pelvis 12/11/2013.  FINDINGS: Upright view of the abdomen at 0800 hrs. No pneumoperitoneum. Stable right upper quadrant surgical clips. Minor lung base atelectasis.  The oral contrast administered yesterday has now reached the rectum, and is primarily distributed within the colon. Increased colonic gas. Still, several dilated gas distended loops are re- identified in the mid abdomen and right upper quadrant, up to 5 cm diameter. These are stable. Stable visualized osseous structures.  IMPRESSION: Partial small bowel obstruction, with interval transit of the oral contrast administered yesterday to the distal colon. No free air.  Study reviewed in person with Dr. Julieanne Manson on 12/12/2013 at 0850 hrs.   Electronically Signed   By: Lars Pinks M.D.   On: 12/12/2013 08:55   Dg Abd 2 Views  12/11/2013   CLINICAL DATA:  Followup small bowel obstruction.  EXAM: ABDOMEN - 2 VIEW  COMPARISON:  12/10/2013  FINDINGS: Small bowel dilation has mildly improved from the prior study, although there is still small bowel dilation and multiple air-fluid levels. This is consistent with mild improvement in a partial small bowel obstruction.  No free air.  Lung bases remain clear.  IMPRESSION: Less small bowel dilation seen consistent with mild improvement in a partial small bowel obstruction. There still findings, however, of bowel obstruction with milder dilated loops small bowel and persistent air-fluid levels.   Electronically Signed   By: Lajean Manes M.D.   On: 12/11/2013 13:27   Dg Abd 2 Views  12/10/2013   CLINICAL DATA:  Abdominal pain, nausea, vomiting and  bloating. Colon cancer.  EXAM: ABDOMEN - 2 VIEW  COMPARISON:  CT chest abdomen pelvis 09/01/2013.  FINDINGS: There are dilated loops of small bowel with air-fluid levels in the central abdomen. Minimal colonic gas. No rectal mass. Surgical clips in the right upper quadrant. Visualized lung bases are clear.  IMPRESSION: Bowel gas pattern is indicative of small bowel obstruction.   Electronically Signed   By: Lorin Picket M.D.   On: 12/10/2013 13:29    Medications: I have reviewed the patient's current medications.  Assessment/Plan: 1. 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 CT of the chest 11/18/2012 with borderline enlarged mediastinal nodes, gastrohepatic ligament nodes and omental/peritoneal nodularity. Negative upper endoscopy. Normal CEA on 12/13/2012. Status post cycle 1 FOLFOX 12/11/2012. Status post cycle 2 FOLFOX 12/25/2012. Status post cycle 3 on 01/15/2013. Cycle 4 on 01/29/2013; cycle 5 on 02/12/2013; cycle 6 on 02/26/2013. Restaging CT evaluation showed improvement. He completed cycle 8 on 03/26/2013. Oxaliplatin was discontinued following cycle 8 due to progressive neuropathy symptoms. He began maintenance Xeloda 04/24/2013. Xeloda dose reduced beginning with cycle 5 on 07/26/2013.  CA 19-9 increased at 297.4 on 09/01/2013  CTs 09/01/2013 with decreased chest and abdomen lymphadenopathy, increased omental nodularity  Cycle 1 of FOLFIRI 09/11/2013.  Cycle 2 FOLFIRI 10/09/2013.  Cycle 3 FOLFIRI 10/23/2013  Cycle 4 FOLFIRI 11/25/2013 2. Cecal/appendix mass noted at the time of exploratory laparotomy  11/13/2012. 3. COPD. 4. Pain secondary to carcinomatosis. 5. Chronic back pain. Stable.  6. Status post Port-A-Cath placement 12/02/2012. 7. Delayed nausea following cycle 1 FOLFOX. Aloxi was added with cycle 2. He had delayed nausea  following cycle 2. Emend was added beginning with cycle 3. Prophylactic Decadron added beginning with cycle 8. He did not take as prescribed. He again had delayed nausea. 8. Diarrhea following cycle 2 FOLFOX. Question related to 5-fluorouracil. The diarrhea improved with Lomotil. The 5-fluorouracil bolus was eliminated and the dose of the 5 fluorouracil infusion was reduced beginning with cycle 3.  9. Left mid back pain/tenderness just lateral to the spine. Question etiology. Duragesic patch increased to 75 mcg every 3 days. He takes oxycodone as needed. He continues to have back pain. 10. Anorexia/weight loss. He began a trial of Megace following office visit 01/08/2013. Appetite is better. No longer taking Megace.  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 Increased with moderate loss of vibratory sense at the fingertips per tuning fork exam and interfering with activity when here 04/09/2013. Oxaliplatin was discontinued. Improved. 12. Nausea/vomiting with cycle 1 Xeloda. He has persistent nausea while on Xeloda. Improved with dose reduction beginning with cycle 5. 13. Partial small bowel obstruction confirmed on plain x-ray 12/10/2013 and CT 12/11/2013 after presenting with frequent belching and abdominal bloating  Recommendations:  1.surgical consult today 2.continue narcotic analgesics for pain  3.continue clear liquid diet 4. ambulate    Frank Baird appears stable this morning. He is tolerating clear liquids. There is contrast in the rectum on a plain x-ray this morning. I reviewed the CT with Frank Baird and his wife. I also reviewed the CT scan in radiology. He appears to have a partial small bowel obstruction related to carcinomatosis. Dr. Zella Richer will see him today to discuss the potential for surgical intervention in the future. He may be able to go home over the next few days if he is  tolerating a liquid diet. He is scheduled for a followup appointment at the [facility] on 12/16/2013. I will discuss the indication for continuing chemotherapy versus Hospice care pending his clinical status over the next few days.  Please call oncology as needed over the weekend. I will be out until 12/15/2013.    LOS: 1 day   Frank Baird  12/12/2013, 9:16 AM

## 2013-12-13 LAB — BASIC METABOLIC PANEL
BUN: 5 mg/dL — AB (ref 6–23)
CO2: 27 mEq/L (ref 19–32)
Calcium: 8.7 mg/dL (ref 8.4–10.5)
Chloride: 102 mEq/L (ref 96–112)
Creatinine, Ser: 0.89 mg/dL (ref 0.50–1.35)
GFR calc Af Amer: >90 mL/min (ref >90)
Glucose, Bld: 119 mg/dL — ABNORMAL HIGH (ref 70–99)
Potassium: 3.9 mEq/L (ref 3.7–5.3)
Sodium: 138 mEq/L (ref 137–147)

## 2013-12-13 LAB — CBC
HEMATOCRIT: 38.4 % — AB (ref 39.0–52.0)
Hemoglobin: 13.1 g/dL (ref 13.0–17.0)
MCH: 30.1 pg (ref 26.0–34.0)
MCHC: 34.1 g/dL (ref 30.0–36.0)
MCV: 88.3 fL (ref 78.0–100.0)
Platelets: 163 10*3/uL (ref 150–400)
RBC: 4.35 MIL/uL (ref 4.22–5.81)
RDW: 13.2 % (ref 11.5–15.5)
WBC: 4.9 10*3/uL (ref 4.0–10.5)

## 2013-12-13 LAB — GLUCOSE, CAPILLARY
GLUCOSE-CAPILLARY: 108 mg/dL — AB (ref 70–99)
GLUCOSE-CAPILLARY: 147 mg/dL — AB (ref 70–99)
Glucose-Capillary: 128 mg/dL — ABNORMAL HIGH (ref 70–99)
Glucose-Capillary: 133 mg/dL — ABNORMAL HIGH (ref 70–99)
Glucose-Capillary: 138 mg/dL — ABNORMAL HIGH (ref 70–99)
Glucose-Capillary: 143 mg/dL — ABNORMAL HIGH (ref 70–99)

## 2013-12-13 MED ORDER — DIPHENOXYLATE-ATROPINE 2.5-0.025 MG PO TABS
1.0000 | ORAL_TABLET | Freq: Four times a day (QID) | ORAL | Status: DC | PRN
  Administered 2013-12-13 (×2): 1 via ORAL
  Filled 2013-12-13 (×2): qty 1

## 2013-12-13 MED ORDER — ENSURE COMPLETE PO LIQD
237.0000 mL | Freq: Two times a day (BID) | ORAL | Status: DC
  Administered 2013-12-13 – 2013-12-15 (×2): 237 mL via ORAL

## 2013-12-13 NOTE — Progress Notes (Signed)
Patient ID: Frank Baird, male   DOB: 10-24-1960, 53 y.o.   MRN: [id number] [facility] PROGRESS NOTE  Frank Baird MVE:[id number] DOB: 01-Nov-1960 DOA: 12/11/2013 PCP: Odette Fraction, MD   Brief narrative:  53 y.o. male with a PMH of metastatic adenocarcinoma/abdominal carcinomatosis, under the care of Dr. Benay Spice, receiving FOLFIRI, who was seen by Dr. Benay Spice in the office 12/10/13 where he had complaints of 2 days of worsening nausea, belching, bloating, and some diarrhea. Dr. Benay Spice wanted him to come into the hospital, and he agreed to be directly admitted 6/11. He says his belching began after his last chemotherapy. Also complains of stabbing, intermittent abdominal pain, nausea/vomiting (no hematemesis), bloating, passing flatus, last BM morning of admission (loose but no melena/hematochezia). Dr. Benay Spice ordered 2 views of the abdomen yesterday which showed findings consistent with a small bowel obstruction, prompting him to refer Frank Baird as a direct admission. He has taken oxycodone and uses a Duragesic patch for pain control.   Principal Problem:  SBO (small bowel obstruction)  - pt still in pain but says he is tolerating clear liquids well  - continue analgesia as needed  - appreciate surgery and oncology assistance  - advance diet to soft per pt wishes Active Problems:  Type 2 diabetes mellitus  - reasonable inpatient control  PSA  - including alcohol and tobacco  - pt tells me he is still smoking and drinking in order to help with chronic pain  - monitor for signs of withdrawal  Hypertension  - reasonable inpatient control  Carcinomatosis  - appreciate oncology input  Moderate to severe malnutrition  - pt lost over 100 lbs in the past few years due to malignancy  - considering palliative care   Consultants:  Surgery  Oncology  Procedures/Studies:  Ct Abdomen Pelvis W Contrast 12/12/2013 Changes of likely abdominal and pelvic peritoneal carcinomatosis  with small amount of free fluid around the liver and in the pelvis, numerous nonenlarged mesenteric lymph nodes, infiltration and nodularity in the omentum and mesenteric, and with infiltration into the abdominal wall at the umbilicus. Prominent lymph nodes in the lower chest. Mesenteric infiltration appears to be mildly progressed since previous study. Borderline prominence of small bowel without definitive evidence of obstruction. Changes could be due to partial obstruction or ileus.  Dg Abd 2 Views 12/11/2013 Less small bowel dilation seen consistent with mild improvement in a partial small bowel obstruction. There still findings, however, of bowel obstruction with milder dilated loops small bowel and persistent air-fluid levels.  Dg Abd 2 Views 12/10/2013 Bowel gas pattern is indicative of small bowel obstruction.  Antibiotics:  None  Code Status: Full  Family Communication: Pt and wife at bedside  Disposition Plan: Home when medically stable, possibly AM  HPI/Subjective: No events overnight.   Objective: Filed Vitals:   12/12/13 1354 12/12/13 2031 12/13/13 0419 12/13/13 1312  BP: 124/77 124/74 128/70 120/73  Pulse: 44 49 46 94  Temp: 98.1 F (36.7 C) 98 F (36.7 C) 98.4 F (36.9 C) 98.3 F (36.8 C)  TempSrc: Axillary Oral Oral Oral  Resp: 18 14 14 16   Height:      Weight:      SpO2: 100% 100% 96% 100%    Intake/Output Summary (Last 24 hours) at 12/13/13 1546 Last data filed at 12/13/13 1230  Gross per 24 hour  Intake   2935 ml  Output      0 ml  Net   2935 ml    Exam:  General:  Pt is alert, follows commands appropriately, not in acute distress  Cardiovascular: Regular rate and rhythm, no rubs, no gallops  Respiratory: Clear to auscultation bilaterally, no wheezing, no crackles, no rhonchi  Abdomen: Soft, non tender, slightly distended, bowel sounds present, no guarding  Extremities: No edema, pulses DP and PT palpable bilaterally  Neuro: Grossly  nonfocal  Data Reviewed: Basic Metabolic Panel:  Recent Labs Lab 12/10/13 0938 12/11/13 1240 12/12/13 0810 12/13/13 0610  NA 140  --  139 138  K 4.5  --  3.7 3.9  CL  --   --  102 102  CO2 26  --  25 27  GLUCOSE 172*  --  122* 119*  BUN 8.4  --  7 5*  CREATININE 1.0 1.04 0.80 0.89  CALCIUM 9.3  --  9.0 8.7   Liver Function Tests:  Recent Labs Lab 12/10/13 0938  AST 14  ALT 20  ALKPHOS 71  BILITOT 0.36  PROT 6.7  ALBUMIN 3.6   No results found for this basename: LIPASE, AMYLASE,  in the last 168 hours No results found for this basename: AMMONIA,  in the last 168 hours CBC:  Recent Labs Lab 12/10/13 0938 12/11/13 1240 12/12/13 0810 12/13/13 0610  WBC 9.4 6.4 4.6 4.9  NEUTROABS 6.5  --   --   --   HGB 14.0 12.8* 13.4 13.1  HCT 42.1 38.5* 39.4 38.4*  MCV 90.9 88.7 87.8 88.3  PLT 239 197 181 163   CBG:  Recent Labs Lab 12/12/13 1205 12/12/13 1608 12/12/13 2016 12/13/13 0003 12/13/13 0418  GLUCAP 147* 111* 103* 128* 138*   Scheduled Meds: . enoxaparin (LOVENOX) injection  40 mg Subcutaneous Q24H  . feeding supplement (ENSURE COMPLETE)  237 mL Oral BID BM  . fentaNYL  75 mcg Transdermal Q72H  . insulin aspart  0-9 Units Subcutaneous 6 times per day  . nicotine  21 mg Transdermal Daily  . pantoprazole (PROTONIX) IV  40 mg Intravenous Q12H  . sodium chloride  10-40 mL Intracatheter Q12H   Continuous Infusions: . dextrose 5 % and 0.9% NaCl 50 mL/hr at 12/13/13 0737   Faye Ramsay, MD  [facility] Pager [phone]  If 7PM-7AM, please contact night-coverage [email or web address] Password [email or web address] 12/13/2013, 3:46 PM   LOS: 2 days

## 2013-12-13 NOTE — Progress Notes (Signed)
Subjective: Complains of feeling weak from not eating. Had 2 loose bm's overnight  Objective: Vital signs in last 24 hours: Temp:  [98 F (36.7 C)-98.4 F (36.9 C)] 98.4 F (36.9 C) (06/13 0419) Pulse Rate:  [44-49] 46 (06/13 0419) Resp:  [14-18] 14 (06/13 0419) BP: (124-128)/(70-77) 128/70 mmHg (06/13 0419) SpO2:  [96 %-100 %] 96 % (06/13 0419) Last BM Date: 12/11/13  Intake/Output from previous day: 06/12 0701 - 06/13 0700 In: 2055 [P.O.:290; I.V.:1765] Out: -  Intake/Output this shift:    Resp: clear to auscultation bilaterally Cardio: regular rate and rhythm GI: soft, mild upper tenderness but no guarding. good bs  Lab Results:   Recent Labs  12/12/13 0810 12/13/13 0610  WBC 4.6 4.9  HGB 13.4 13.1  HCT 39.4 38.4*  PLT 181 163   BMET  Recent Labs  12/12/13 0810 12/13/13 0610  NA 139 138  K 3.7 3.9  CL 102 102  CO2 25 27  GLUCOSE 122* 119*  BUN 7 5*  CREATININE 0.80 0.89  CALCIUM 9.0 8.7   PT/INR No results found for this basename: LABPROT, INR,  in the last 72 hours ABG No results found for this basename: PHART, PCO2, PO2, HCO3,  in the last 72 hours  Studies/Results: Ct Abdomen Pelvis W Contrast  12/12/2013   CLINICAL DATA:  Several days of abdominal distention, belching, and abdominal pain. History of colon cancer currently on chemotherapy.  EXAM: CT ABDOMEN AND PELVIS WITH CONTRAST  TECHNIQUE: Multidetector CT imaging of the abdomen and pelvis was performed using the standard protocol following bolus administration of intravenous contrast.  CONTRAST:  72mL OMNIPAQUE IOHEXOL 300 MG/ML SOLN, 164mL OMNIPAQUE IOHEXOL 300 MG/ML SOLN  COMPARISON:  09/01/2013  FINDINGS: Mild dependent changes in the lung bases. Right pericardial lymph nodes are mildly enlarged, similar to prior study. Distal paraesophageal lymph nodes are also unchanged.  Free fluid around the liver edge and extending along the right pericolic gutter. This is similar to prior study.  Nodular infiltrative changes in the omentum and mesentery likely represent peritoneal carcinomatosis. Mesenteric changes appear slightly more prominent than on prior study. No focal liver lesions are identified. Surgical absence of the gallbladder. Mild dilatation of bile ducts is probably physiologic after cholecystectomy. Spleen size is normal. No adrenal gland nodules. Kidneys appear symmetrical without mass or hydronephrosis. Inferior vena cava and abdominal aorta are normal allowing for vascular calcifications. Pancreas is unremarkable. Retroperitoneal lymph nodes are not pathologically enlarged. Mesenteric lymph nodes are numerous without pathologic enlargement and probably metastatic. No free air in the abdomen. Nonspecific infiltration in the umbilicus could represent tumor infiltration oral postoperative scarring. This is unchanged. Gastric wall is not thickened. Proximal small bowel are mildly prominent without discrete dilatation. Fluid filled distal small bowel. Partial obstruction or ileus are not excluded. Appearance is similar to prior study. Stool-filled colon without distention.  Pelvis: Prostate gland is not enlarged. Bladder wall is not thickened. Small amount of free fluid in the pelvis. Pelvic lymph nodes are moderately prominent without pathologic enlargement. This is stable. Sigmoid colonic wall thickening demonstrated on prior study is less well visualized today due to degree of distention. No diverticulitis. Appendix is normal. No destructive or expansile bone lesions are appreciated.  IMPRESSION: Changes of likely abdominal and pelvic peritoneal carcinomatosis with small amount of free fluid around the liver and in the pelvis, numerous nonenlarged mesenteric lymph nodes, infiltration and nodularity in the omentum and mesenteric, and with infiltration into the abdominal wall at the umbilicus.  Prominent lymph nodes in the lower chest. Mesenteric infiltration appears to be mildly progressed  since previous study. Borderline prominence of small bowel without definitive evidence of obstruction. Changes could be due to partial obstruction or ileus.   Electronically Signed   By: Lucienne Capers M.D.   On: 12/12/2013 02:08   Dg Abd 2 Views  12/12/2013   CLINICAL DATA:  53 year old male with abdominal distention, known peritoneal carcinomatosis, suspected bowel obstruction. Initial encounter.  EXAM: ABDOMEN - 2 VIEW  COMPARISON:  CT Abdomen and Pelvis 12/11/2013.  FINDINGS: Upright view of the abdomen at 0800 hrs. No pneumoperitoneum. Stable right upper quadrant surgical clips. Minor lung base atelectasis.  The oral contrast administered yesterday has now reached the rectum, and is primarily distributed within the colon. Increased colonic gas. Still, several dilated gas distended loops are re- identified in the mid abdomen and right upper quadrant, up to 5 cm diameter. These are stable. Stable visualized osseous structures.  IMPRESSION: Partial small bowel obstruction, with interval transit of the oral contrast administered yesterday to the distal colon. No free air.  Study reviewed in person with Dr. Julieanne Manson on 12/12/2013 at 0850 hrs.   Electronically Signed   By: Lars Pinks M.D.   On: 12/12/2013 08:55   Dg Abd 2 Views  12/11/2013   CLINICAL DATA:  Followup small bowel obstruction.  EXAM: ABDOMEN - 2 VIEW  COMPARISON:  12/10/2013  FINDINGS: Small bowel dilation has mildly improved from the prior study, although there is still small bowel dilation and multiple air-fluid levels. This is consistent with mild improvement in a partial small bowel obstruction.  No free air.  Lung bases remain clear.  IMPRESSION: Less small bowel dilation seen consistent with mild improvement in a partial small bowel obstruction. There still findings, however, of bowel obstruction with milder dilated loops small bowel and persistent air-fluid levels.   Electronically Signed   By: Lajean Manes M.D.   On: 12/11/2013 13:27     Anti-infectives: Anti-infectives   None      Assessment/Plan: s/p * No surgery found * Advance diet. Start fulls and add ensure since he has tolerated clears Ambulate  LOS: 2 days    TOTH III,Citlalli Weikel S 12/13/2013

## 2013-12-14 DIAGNOSIS — C800 Disseminated malignant neoplasm, unspecified: Secondary | ICD-10-CM

## 2013-12-14 LAB — COMPREHENSIVE METABOLIC PANEL
ALT: 59 U/L — AB (ref 0–53)
AST: 69 U/L — ABNORMAL HIGH (ref 0–37)
Albumin: 3 g/dL — ABNORMAL LOW (ref 3.5–5.2)
Alkaline Phosphatase: 107 U/L (ref 39–117)
BUN: 4 mg/dL — AB (ref 6–23)
CALCIUM: 9 mg/dL (ref 8.4–10.5)
CO2: 28 mEq/L (ref 19–32)
Chloride: 104 mEq/L (ref 96–112)
Creatinine, Ser: 1.07 mg/dL (ref 0.50–1.35)
GFR calc non Af Amer: 77 mL/min — ABNORMAL LOW (ref >90)
GFR, EST AFRICAN AMERICAN: 90 mL/min — AB (ref >90)
GLUCOSE: 116 mg/dL — AB (ref 70–99)
Potassium: 4.1 mEq/L (ref 3.7–5.3)
SODIUM: 141 meq/L (ref 137–147)
Total Bilirubin: 0.6 mg/dL (ref 0.3–1.2)
Total Protein: 6 g/dL (ref 6.0–8.3)

## 2013-12-14 LAB — CBC
HCT: 38.1 % — ABNORMAL LOW (ref 39.0–52.0)
HEMOGLOBIN: 12.9 g/dL — AB (ref 13.0–17.0)
MCH: 29.9 pg (ref 26.0–34.0)
MCHC: 33.9 g/dL (ref 30.0–36.0)
MCV: 88.2 fL (ref 78.0–100.0)
Platelets: 155 10*3/uL (ref 150–400)
RBC: 4.32 MIL/uL (ref 4.22–5.81)
RDW: 13.1 % (ref 11.5–15.5)
WBC: 5.3 10*3/uL (ref 4.0–10.5)

## 2013-12-14 LAB — GLUCOSE, CAPILLARY
GLUCOSE-CAPILLARY: 103 mg/dL — AB (ref 70–99)
Glucose-Capillary: 110 mg/dL — ABNORMAL HIGH (ref 70–99)
Glucose-Capillary: 124 mg/dL — ABNORMAL HIGH (ref 70–99)

## 2013-12-14 LAB — CLOSTRIDIUM DIFFICILE BY PCR: Toxigenic C. Difficile by PCR: NEGATIVE

## 2013-12-14 NOTE — Progress Notes (Signed)
  Subjective: Started having a lot of diarrhea yesterday. Tolerated diet  Objective: Vital signs in last 24 hours: Temp:  [97.9 F (36.6 C)-98.3 F (36.8 C)] 97.9 F (36.6 C) (06/14 0425) Pulse Rate:  [48-94] 48 (06/14 0425) Resp:  [14-16] 14 (06/14 0425) BP: (120-124)/(52-73) 120/52 mmHg (06/14 0425) SpO2:  [95 %-100 %] 95 % (06/14 0425) Last BM Date: 12/13/13  Intake/Output from previous day: 06/13 0701 - 06/14 0700 In: 2950 [P.O.:1700; I.V.:1250] Out: -  Intake/Output this shift:    Resp: clear to auscultation bilaterally Cardio: regular rate and rhythm GI: soft, mild upper tenderness  Lab Results:   Recent Labs  12/13/13 0610 12/14/13 0605  WBC 4.9 5.3  HGB 13.1 12.9*  HCT 38.4* 38.1*  PLT 163 155   BMET  Recent Labs  12/13/13 0610 12/14/13 0605  NA 138 141  K 3.9 4.1  CL 102 104  CO2 27 28  GLUCOSE 119* 116*  BUN 5* 4*  CREATININE 0.89 1.07  CALCIUM 8.7 9.0   PT/INR No results found for this basename: LABPROT, INR,  in the last 72 hours ABG No results found for this basename: PHART, PCO2, PO2, HCO3,  in the last 72 hours  Studies/Results: No results found.  Anti-infectives: Anti-infectives   None      Assessment/Plan: s/p * No surgery found * Advance diet. He is now on a soft diet. Would hold there for now Ambulate Pain management  LOS: 3 days    TOTH III,Allexis Bordenave S 12/14/2013

## 2013-12-14 NOTE — Progress Notes (Signed)
Patient ID: Frank Baird, male   DOB: 21-Mar-1961, 53 y.o.   MRN: [id number]  [facility] PROGRESS NOTE  ETHELBERT THAIN ZES:[id number] DOB: May 01, 1961 DOA: 12/11/2013 PCP: Odette Fraction, MD  Brief narrative:  53 y.o. male with a PMH of metastatic adenocarcinoma/abdominal carcinomatosis, under the care of Dr. Benay Spice, receiving FOLFIRI, who was seen by Dr. Benay Spice in the office 12/10/13 where he had complaints of 2 days of worsening nausea, belching, bloating, and some diarrhea. Dr. Benay Spice wanted him to come into the hospital, and he agreed to be directly admitted 6/11. He says his belching began after his last chemotherapy. Also complains of stabbing, intermittent abdominal pain, nausea/vomiting (no hematemesis), bloating, passing flatus, last BM morning of admission (loose but no melena/hematochezia). Dr. Benay Spice ordered 2 views of the abdomen yesterday which showed findings consistent with a small bowel obstruction, prompting him to refer Frank Baird as a direct admission. He has taken oxycodone and uses a Duragesic patch for pain control.   Principal Problem:  SBO (small bowel obstruction)  - pt still in pain but says he is tolerating soft diet well, he is insisting on advancing diet to regular  - continue analgesia as needed  - appreciate surgery and oncology assistance  Active Problems:  Diarrhea - will ask for C. Diff, stool culture, stool panel  Type 2 diabetes mellitus  - reasonable inpatient control  PSA  - including alcohol and tobacco  - pt tells me he is still smoking and drinking in order to help with chronic pain  - monitor for signs of withdrawal  Hypertension  - reasonable inpatient control  Carcinomatosis  - appreciate oncology input  Moderate to severe malnutrition  - pt lost over 100 lbs in the past few years due to malignancy   Consultants:  Surgery  Oncology  Procedures/Studies:  Ct Abdomen Pelvis W Contrast 12/12/2013 Changes of likely abdominal  and pelvic peritoneal carcinomatosis with small amount of free fluid around the liver and in the pelvis, numerous nonenlarged mesenteric lymph nodes, infiltration and nodularity in the omentum and mesenteric, and with infiltration into the abdominal wall at the umbilicus. Prominent lymph nodes in the lower chest. Mesenteric infiltration appears to be mildly progressed since previous study. Borderline prominence of small bowel without definitive evidence of obstruction. Changes could be due to partial obstruction or ileus.  Dg Abd 2 Views 12/11/2013 Less small bowel dilation seen consistent with mild improvement in a partial small bowel obstruction. There still findings, however, of bowel obstruction with milder dilated loops small bowel and persistent air-fluid levels.  Dg Abd 2 Views 12/10/2013 Bowel gas pattern is indicative of small bowel obstruction.  Antibiotics:  None  Code Status: Full  Family Communication: Pt and wife at bedside  Disposition Plan: Home when medically stable  HPI/Subjective: No events overnight.   Objective: Filed Vitals:   12/13/13 0419 12/13/13 1312 12/13/13 2042 12/14/13 0425  BP: 128/70 120/73 124/73 120/52  Pulse: 46 94 54 48  Temp: 98.4 F (36.9 C) 98.3 F (36.8 C) 98 F (36.7 C) 97.9 F (36.6 C)  TempSrc: Oral Oral Oral Oral  Resp: 14 16 14 14   Height:      Weight:      SpO2: 96% 100% 100% 95%    Intake/Output Summary (Last 24 hours) at 12/14/13 1256 Last data filed at 12/14/13 0900  Gross per 24 hour  Intake   2430 ml  Output      0 ml  Net   2430  ml    Exam:   General:  Pt is alert, follows commands appropriately, not in acute distress  Cardiovascular: Regular rate and rhythm, S1/S2, no murmurs, no rubs, no gallops  Respiratory: Clear to auscultation bilaterally, no wheezing, no crackles, no rhonchi  Abdomen: Soft, tender in epigastric area, non distended, bowel sounds present, no guarding  Extremities: No edema, pulses DP and PT  palpable bilaterally  Neuro: Grossly nonfocal  Data Reviewed: Basic Metabolic Panel:  Recent Labs Lab 12/10/13 0938 12/11/13 1240 12/12/13 0810 12/13/13 0610 12/14/13 0605  NA 140  --  139 138 141  K 4.5  --  3.7 3.9 4.1  CL  --   --  102 102 104  CO2 26  --  25 27 28   GLUCOSE 172*  --  122* 119* 116*  BUN 8.4  --  7 5* 4*  CREATININE 1.0 1.04 0.80 0.89 1.07  CALCIUM 9.3  --  9.0 8.7 9.0   Liver Function Tests:  Recent Labs Lab 12/10/13 0938 12/14/13 0605  AST 14 69*  ALT 20 59*  ALKPHOS 71 107  BILITOT 0.36 0.6  PROT 6.7 6.0  ALBUMIN 3.6 3.0*   CBC:  Recent Labs Lab 12/10/13 0938 12/11/13 1240 12/12/13 0810 12/13/13 0610 12/14/13 0605  WBC 9.4 6.4 4.6 4.9 5.3  NEUTROABS 6.5  --   --   --   --   HGB 14.0 12.8* 13.4 13.1 12.9*  HCT 42.1 38.5* 39.4 38.4* 38.1*  MCV 90.9 88.7 87.8 88.3 88.2  PLT 239 197 181 163 155   CBG:  Recent Labs Lab 12/13/13 1709 12/13/13 2028 12/14/13 0008 12/14/13 0406 12/14/13 0803  GLUCAP 133* 143* 110* 103* 124*   Scheduled Meds: . enoxaparin (LOVENOX) injection  40 mg Subcutaneous Q24H  . feeding supplement (ENSURE COMPLETE)  237 mL Oral BID BM  . fentaNYL  75 mcg Transdermal Q72H  . insulin aspart  0-9 Units Subcutaneous 6 times per day  . nicotine  21 mg Transdermal Daily  . pantoprazole (PROTONIX) IV  40 mg Intravenous Q12H  . sodium chloride  10-40 mL Intracatheter Q12H   Continuous Infusions:   Faye Ramsay, MD  [facility] Pager [phone]  If 7PM-7AM, please contact night-coverage [email or web address] Password [email or web address] 12/14/2013, 12:56 PM   LOS: 3 days

## 2013-12-15 LAB — CBC
HEMATOCRIT: 35.9 % — AB (ref 39.0–52.0)
Hemoglobin: 12.3 g/dL — ABNORMAL LOW (ref 13.0–17.0)
MCH: 30.3 pg (ref 26.0–34.0)
MCHC: 34.3 g/dL (ref 30.0–36.0)
MCV: 88.4 fL (ref 78.0–100.0)
Platelets: 139 10*3/uL — ABNORMAL LOW (ref 150–400)
RBC: 4.06 MIL/uL — ABNORMAL LOW (ref 4.22–5.81)
RDW: 13.2 % (ref 11.5–15.5)
WBC: 5.3 10*3/uL (ref 4.0–10.5)

## 2013-12-15 LAB — GI PATHOGEN PANEL BY PCR, STOOL
C difficile toxin A/B: NEGATIVE
Campylobacter by PCR: NEGATIVE
Cryptosporidium by PCR: NEGATIVE
E coli (ETEC) LT/ST: NEGATIVE
E coli (STEC): NEGATIVE
E coli 0157 by PCR: NEGATIVE
G LAMBLIA BY PCR: NEGATIVE
Norovirus GI/GII: NEGATIVE
Rotavirus A by PCR: NEGATIVE
SALMONELLA BY PCR: NEGATIVE
SHIGELLA BY PCR: NEGATIVE

## 2013-12-15 LAB — COMPREHENSIVE METABOLIC PANEL
ALBUMIN: 3.1 g/dL — AB (ref 3.5–5.2)
ALK PHOS: 91 U/L (ref 39–117)
ALT: 38 U/L (ref 0–53)
AST: 26 U/L (ref 0–37)
BUN: 4 mg/dL — ABNORMAL LOW (ref 6–23)
CHLORIDE: 104 meq/L (ref 96–112)
CO2: 26 mEq/L (ref 19–32)
CREATININE: 0.99 mg/dL (ref 0.50–1.35)
Calcium: 8.8 mg/dL (ref 8.4–10.5)
GFR calc Af Amer: >90 mL/min (ref >90)
GFR calc non Af Amer: >90 mL/min (ref >90)
Glucose, Bld: 102 mg/dL — ABNORMAL HIGH (ref 70–99)
POTASSIUM: 3.8 meq/L (ref 3.7–5.3)
Sodium: 141 mEq/L (ref 137–147)
Total Bilirubin: 0.8 mg/dL (ref 0.3–1.2)
Total Protein: 6 g/dL (ref 6.0–8.3)

## 2013-12-15 MED ORDER — HEPARIN SOD (PORK) LOCK FLUSH 100 UNIT/ML IV SOLN
500.0000 [IU] | INTRAVENOUS | Status: AC | PRN
  Administered 2013-12-15: 500 [IU]
  Filled 2013-12-15: qty 5

## 2013-12-15 MED ORDER — HYDROMORPHONE HCL 4 MG PO TABS: 4.0000 mg | ORAL_TABLET | ORAL | Status: DC | PRN

## 2013-12-15 NOTE — Plan of Care (Signed)
Problem: Discharge Progression Outcomes Goal: Tolerating diet Outcome: Completed/Met Date Met:  12/15/13 Patient now tolerating regular diet.

## 2013-12-15 NOTE — Discharge Summary (Signed)
Physician Discharge Summary  Frank Baird CWC:[id number] DOB: 02/01/1961 DOA: 12/11/2013  PCP: Odette Fraction, MD  Admit date: 12/11/2013 Discharge date: 12/15/2013  Recommendations for Outpatient Follow-up:  1. Pt will need to follow up with PCP in 2-3 weeks post discharge 2. Please obtain BMP to evaluate electrolytes and kidney function 3. Please also check CBC to evaluate Hg and Hct levels  Discharge Diagnoses: Small bowel obstruction Principal Problem:   SBO (small bowel obstruction) Active Problems:   Type 2 diabetes mellitus   Hypertension   Other malignant neoplasm without specification of site   Carcinomatosis   Tobacco abuse  Discharge Condition: Stable  Diet recommendation: Heart healthy diet discussed in details   Brief narrative:  53 y.o. male with a PMH of metastatic adenocarcinoma/abdominal carcinomatosis, under the care of Dr. Benay Spice, receiving FOLFIRI, who was seen by Dr. Benay Spice in the office 12/10/13 where he had complaints of 2 days of worsening nausea, belching, bloating, and some diarrhea. Dr. Benay Spice wanted him to come into the hospital, and he agreed to be directly admitted 6/11. He says his belching began after his last chemotherapy. Also complains of stabbing, intermittent abdominal pain, nausea/vomiting (no hematemesis), bloating, passing flatus, last BM morning of admission (loose but no melena/hematochezia). Dr. Benay Spice ordered 2 views of the abdomen yesterday which showed findings consistent with a small bowel obstruction, prompting him to refer Frank Baird as a direct admission. He has taken oxycodone and uses a Duragesic patch for pain control.   Principal Problem:  SBO (small bowel obstruction)  -Patient denies any pain this morning, feels well and wants to go home - Diet has been advanced to regular 24 hours ago and patient tolerating well with no pain and no nausea or vomiting  - continue analgesia as needed  - appreciate surgery and  oncology assistance  Active Problems:  Diarrhea  - will ask for C. Diff, stool culture, stool panel --> All negative to date  - Patient denies diarrhea over the past 24 hours  Type 2 diabetes mellitus  - reasonable inpatient control  PSA  - including alcohol and tobacco  - pt tells me he is still smoking and drinking in order to help with chronic pain  - No signs of withdrawal while inpatient  Hypertension  - reasonable inpatient control  Carcinomatosis  - appreciate oncology input  Moderate to severe malnutrition  - pt lost over 100 lbs in the past few years due to malignancy   Consultants:  Surgery  Oncology  Procedures/Studies:  Ct Abdomen Pelvis W Contrast 12/12/2013 Changes of likely abdominal and pelvic peritoneal carcinomatosis with small amount of free fluid around the liver and in the pelvis, numerous nonenlarged mesenteric lymph nodes, infiltration and nodularity in the omentum and mesenteric, and with infiltration into the abdominal wall at the umbilicus. Prominent lymph nodes in the lower chest. Mesenteric infiltration appears to be mildly progressed since previous study. Borderline prominence of small bowel without definitive evidence of obstruction. Changes could be due to partial obstruction or ileus.  Dg Abd 2 Views 12/11/2013 Less small bowel dilation seen consistent with mild improvement in a partial small bowel obstruction. There still findings, however, of bowel obstruction with milder dilated loops small bowel and persistent air-fluid levels.  Dg Abd 2 Views 12/10/2013 Bowel gas pattern is indicative of small bowel obstruction.  Antibiotics:  None  Code Status: Full  Family Communication: Pt and wife at bedside   Discharge Exam: Filed Vitals:   12/15/13 0607  BP:  136/74  Pulse: 46  Temp: 98 F (36.7 C)  Resp: 16   Filed Vitals:   12/14/13 0425 12/14/13 1415 12/14/13 2124 12/15/13 0607  BP: 120/52 132/69 133/71 136/74  Pulse: 48 51 46 46  Temp: 97.9 F  (36.6 C) 98.1 F (36.7 C) 98.6 F (37 C) 98 F (36.7 C)  TempSrc: Oral Oral Oral Oral  Resp: 14 18 18 16   Height:      Weight:      SpO2: 95% 100% 98% 97%    General: Pt is alert, follows commands appropriately, not in acute distress Cardiovascular: Regular rate and rhythm, no rubs, no gallops Respiratory: Clear to auscultation bilaterally, no wheezing Abdominal: Soft, non tender, non distended, bowel sounds +, no guarding Extremities: no edema, no cyanosis, pulses palpable bilaterally DP and PT Neuro: Grossly nonfocal  Discharge Instructions  Discharge Instructions   Diet - low sodium heart healthy    Complete by:  As directed      Increase activity slowly    Complete by:  As directed             Medication List         albuterol 108 (90 BASE) MCG/ACT inhaler  Commonly known as:  PROVENTIL HFA;VENTOLIN HFA  Inhale 2 puffs into the lungs every 6 (six) hours as needed for wheezing.     diphenoxylate-atropine 2.5-0.025 MG per tablet  Commonly known as:  LOMOTIL  Take 1 tablet by mouth 4 (four) times daily as needed for diarrhea or loose stools.     esomeprazole 40 MG capsule  Commonly known as:  NEXIUM  Take 1 capsule (40 mg total) by mouth daily before breakfast.     fentaNYL 75 MCG/HR  Commonly known as:  DURAGESIC - dosed mcg/hr  Place 1 patch (75 mcg total) onto the skin every 3 (three) days.     HYDROmorphone 4 MG tablet  Commonly known as:  DILAUDID  Take 1 tablet (4 mg total) by mouth every 4 (four) hours as needed for moderate pain or severe pain.     lidocaine-prilocaine cream  Commonly known as:  EMLA  Apply to port a cath site one hour prior to chemo. Do not rub in. Cover with plastic.     lisinopril 20 MG tablet  Commonly known as:  PRINIVIL,ZESTRIL  Take 20 mg by mouth daily.     LORazepam 1 MG tablet  Commonly known as:  ATIVAN  Take 1 tablet (1 mg total) by mouth every 6 (six) hours as needed for anxiety.     metoCLOPramide 10 MG tablet   Commonly known as:  REGLAN  Take 1 tablet (10 mg total) by mouth 3 (three) times daily before meals.     nicotine 21 mg/24hr patch  Commonly known as:  NICODERM CQ - dosed in mg/24 hours  Place 21 mg onto the skin daily.     ondansetron 8 MG tablet  Commonly known as:  ZOFRAN  Take 1 tablet (8 mg total) by mouth every 8 (eight) hours as needed for nausea.     Oxycodone HCl 10 MG Tabs  Take 10 mg by mouth every 4 (four) hours as needed (pain).     pioglitazone 30 MG tablet  Commonly known as:  ACTOS  Take 30 mg by mouth daily.           Follow-up Information   Schedule an appointment as soon as possible for a visit with [facility] TOM, MD.  Specialty:  Family Medicine   Contact information:   [address] [place] [address] [phone]       Follow up with Faye Ramsay, MD.   Specialty:  Internal Medicine   Contact information:   [address] [place] [address] [phone]        The results of significant diagnostics from this hospitalization (including imaging, microbiology, ancillary and laboratory) are listed below for reference.     Microbiology: Recent Results (from the past 240 hour(s))  CLOSTRIDIUM DIFFICILE BY PCR     Status: None   Collection Time    12/14/13  2:21 PM      Result Value Ref Range Status   C difficile by pcr NEGATIVE  NEGATIVE Final   Comment: Performed at [place]: Basic Metabolic Panel:  Recent Labs Lab 12/10/13 0938 12/11/13 1240 12/12/13 0810 12/13/13 0610 12/14/13 0605 12/15/13 0345  NA 140  --  139 138 141 141  K 4.5  --  3.7 3.9 4.1 3.8  CL  --   --  102 102 104 104  CO2 26  --  25 27 28 26   GLUCOSE 172*  --  122* 119* 116* 102*  BUN 8.4  --  7 5* 4* 4*  CREATININE 1.0 1.04 0.80 0.89 1.07 0.99  CALCIUM 9.3  --  9.0 8.7 9.0 8.8   Liver Function Tests:  Recent Labs Lab 12/10/13 0938 12/14/13 0605 12/15/13 0345  AST 14 69* 26  ALT 20 59* 38  ALKPHOS  71 107 91  BILITOT 0.36 0.6 0.8  PROT 6.7 6.0 6.0  ALBUMIN 3.6 3.0* 3.1*   CBC:  Recent Labs Lab 12/10/13 0938 12/11/13 1240 12/12/13 0810 12/13/13 0610 12/14/13 0605 12/15/13 0345  WBC 9.4 6.4 4.6 4.9 5.3 5.3  NEUTROABS 6.5  --   --   --   --   --   HGB 14.0 12.8* 13.4 13.1 12.9* 12.3*  HCT 42.1 38.5* 39.4 38.4* 38.1* 35.9*  MCV 90.9 88.7 87.8 88.3 88.2 88.4  PLT 239 197 181 163 155 139*   CBG:  Recent Labs Lab 12/13/13 1709 12/13/13 2028 12/14/13 0008 12/14/13 0406 12/14/13 0803  GLUCAP 133* 143* 110* 103* 55*     SIGNED: Time coordinating discharge: Over 30 minutes  Faye Ramsay, MD  [facility] 12/15/2013, 10:59 AM Pager [phone]  If 7PM-7AM, please contact night-coverage [email or web address] Password [email or web address]

## 2013-12-15 NOTE — Discharge Instructions (Signed)
Small Bowel Obstruction A small bowel obstruction is a blockage (obstruction) of the small intestine (small bowel). The small bowel is a long, slender tube that connects the stomach to the colon. Its job is to absorb nutrients from the fluids and foods you consume into the bloodstream.  CAUSES  There are many causes of intestinal blockage. The most common ones include:  Hernias. This is a more common cause in children than adults.  Inflammatory bowel disease (enteritis and colitis).  Twisting of the bowel (volvulus).  Tumors.  Scar tissue (adhesions) from previous surgery or radiation treatment.  Recent surgery. This may cause an acute small bowel obstruction called an ileus. SYMPTOMS   Abdominal pain. This may be dull cramps or sharp pain. It may occur in one area or may be present in the entire abdomen. Pain can range from mild to severe, depending on the degree of obstruction.  Nausea and vomiting. Vomit may be greenish or yellow bile color.  Distended or swollen stomach. Abdominal bloating is a common symptom.  Constipation.  Lack of passing gas.  Frequent belching.  Diarrhea. This may occur if runny stool is able to leak around the obstruction. DIAGNOSIS  Your caregiver can usually diagnose small bowel obstruction by taking a history, doing a physical exam, and taking X-rays. If the cause is unclear, a CT scan (computerized tomography) of your abdomen and pelvis may be needed. TREATMENT  Treatment of the blockage depends on the cause and how bad the problem is.   Sometimes, the obstruction improves with bed rest and intravenous (IV) fluids.  Resting the bowel is very important. This means following a simple diet. Sometimes, a clear liquid diet may be required for several days.  Sometimes, a small tube (nasogastric tube) is placed into the stomach to decompress the bowel. When the bowel is blocked, it usually swells up like a balloon filled with air and fluids.  Decompression means that the air and fluids are removed by suction through that tube. This can help with pain, discomfort, and nausea. It can also help the obstruction resolve faster.  Surgery may be required if other treatments do not work. Bowel obstruction from a hernia may require early surgery and can be an emergency procedure. Adhesions that cause frequent or severe obstructions may also require surgery. HOME CARE INSTRUCTIONS If your bowel obstruction is only partial or incomplete, you may be allowed to go home.  Get plenty of rest.  Follow your diet as directed by your caregiver.  Only consume clear liquids until your condition improves.  Avoid solid foods as instructed. SEEK IMMEDIATE MEDICAL CARE IF:  You have increased pain or cramping.  You vomit blood.  You have uncontrolled vomiting or nausea.  You cannot drink fluids due to vomiting or pain.  You develop confusion.  You begin feeling very dry or thirsty (dehydrated).  You have severe bloating.  You have chills.  You have a fever.  You feel extremely weak or you faint. MAKE SURE YOU:  Understand these instructions.  Will watch your condition.  Will get help right away if you are not doing well or get worse. Document Released: 09/05/2005 Document Revised: 09/11/2011 Document Reviewed: 09/02/2010 Exit[employer] Patient Information 2014 [employer].  

## 2013-12-15 NOTE — Progress Notes (Signed)
Discharge instructions reviewed with patient and he verbalized understanding.  Patient stable for discharge home.  Patient's assessment unchanged from this am.  Patient understands follow-up appointments.  Zandra Abts [facility]  12/15/2013  11:58 AM

## 2013-12-15 NOTE — Progress Notes (Signed)
Patient ID: Frank Baird, male   DOB: 11-29-1960, 53 y.o.   MRN: [id number]    Subjective: Pt feels ok this morning, except mild nausea, chronic to him, and some abdominal pain.  Can't remember when he last had a BM.  Wasn't yesterday.  Objective: Vital signs in last 24 hours: Temp:  [98 F (36.7 C)-98.6 F (37 C)] 98 F (36.7 C) (06/15 0607) Pulse Rate:  [46-51] 46 (06/15 0607) Resp:  [16-18] 16 (06/15 0607) BP: (132-136)/(69-74) 136/74 mmHg (06/15 0607) SpO2:  [97 %-100 %] 97 % (06/15 0607) Last BM Date: 12/14/13  Intake/Output from previous day: 06/14 0701 - 06/15 0700 In: 1320 [P.O.:1320] Out: -  Intake/Output this shift:    PE: Abd: soft, mild diffuse tenderness, minimal bloating, few BS Heart: regular Lungs: CTAB  Lab Results:   Recent Labs  12/14/13 0605 12/15/13 0345  WBC 5.3 5.3  HGB 12.9* 12.3*  HCT 38.1* 35.9*  PLT 155 139*   BMET  Recent Labs  12/14/13 0605 12/15/13 0345  NA 141 141  K 4.1 3.8  CL 104 104  CO2 28 26  GLUCOSE 116* 102*  BUN 4* 4*  CREATININE 1.07 0.99  CALCIUM 9.0 8.8   PT/INR No results found for this basename: LABPROT, INR,  in the last 72 hours CMP     Component Value Date/Time   NA 141 12/15/2013 0345   NA 140 12/10/2013 0938   K 3.8 12/15/2013 0345   K 4.5 12/10/2013 0938   CL 104 12/15/2013 0345   CL 98 12/25/2012 0903   CO2 26 12/15/2013 0345   CO2 26 12/10/2013 0938   GLUCOSE 102* 12/15/2013 0345   GLUCOSE 172* 12/10/2013 0938   GLUCOSE 143* 12/25/2012 0903   BUN 4* 12/15/2013 0345   BUN 8.4 12/10/2013 0938   CREATININE 0.99 12/15/2013 0345   CREATININE 1.0 12/10/2013 0938   CREATININE 0.72 11/12/2012 1430   CALCIUM 8.8 12/15/2013 0345   CALCIUM 9.3 12/10/2013 0938   PROT 6.0 12/15/2013 0345   PROT 6.7 12/10/2013 0938   ALBUMIN 3.1* 12/15/2013 0345   ALBUMIN 3.6 12/10/2013 0938   AST 26 12/15/2013 0345   AST 14 12/10/2013 0938   ALT 38 12/15/2013 0345   ALT 20 12/10/2013 0938   ALKPHOS 91 12/15/2013 0345   ALKPHOS 71  12/10/2013 0938   BILITOT 0.8 12/15/2013 0345   BILITOT 0.36 12/10/2013 0938   GFRNONAA >90 12/15/2013 0345   GFRNONAA >89 10/21/2012 1444   GFRAA >90 12/15/2013 0345   GFRAA >89 10/21/2012 1444   Lipase  No results found for this basename: lipase       Studies/Results: No results found.  Anti-infectives: Anti-infectives   None       Assessment/Plan  1. PSBO, resolved 2.  Carcinomatosis   Plan: 1. Patient currently tolerating a soft diet, passing flatus, and intermittent diarrhea.  No plans for surgical intervention at this time. 2. Being checked for c diff. 3. We will sign off currently, but please call back if patient worsens.  LOS: 4 days    Julianna Vanwagner E 12/15/2013, 8:31 AM Pager: [phone]

## 2013-12-16 ENCOUNTER — Other Ambulatory Visit: Payer: Federal, State, Local not specified - PPO

## 2013-12-16 ENCOUNTER — Ambulatory Visit: Payer: Federal, State, Local not specified - PPO | Admitting: Nurse Practitioner

## 2013-12-16 ENCOUNTER — Telehealth: Payer: Self-pay | Admitting: Oncology

## 2013-12-16 NOTE — Telephone Encounter (Signed)
pt called to r/s missed appt...per Dr. Learta Codding sched pt for 6.22 with lisa to r/s tx. Pt opted to keep original appt on 6.24.

## 2013-12-17 ENCOUNTER — Inpatient Hospital Stay: Payer: Federal, State, Local not specified - PPO

## 2013-12-17 LAB — GLUCOSE, CAPILLARY
GLUCOSE-CAPILLARY: 108 mg/dL — AB (ref 70–99)
Glucose-Capillary: 109 mg/dL — ABNORMAL HIGH (ref 70–99)
Glucose-Capillary: 144 mg/dL — ABNORMAL HIGH (ref 70–99)
Glucose-Capillary: 159 mg/dL — ABNORMAL HIGH (ref 70–99)
Glucose-Capillary: 95 mg/dL (ref 70–99)

## 2013-12-18 LAB — STOOL CULTURE

## 2013-12-21 ENCOUNTER — Other Ambulatory Visit: Payer: Self-pay | Admitting: Oncology

## 2013-12-22 ENCOUNTER — Telehealth: Payer: Self-pay | Admitting: *Deleted

## 2013-12-22 ENCOUNTER — Other Ambulatory Visit: Payer: Self-pay

## 2013-12-22 ENCOUNTER — Encounter (HOSPITAL_COMMUNITY): Payer: Self-pay | Admitting: Emergency Medicine

## 2013-12-22 ENCOUNTER — Inpatient Hospital Stay (HOSPITAL_COMMUNITY)
Admission: EM | Admit: 2013-12-22 | Discharge: 2013-12-30 | DRG: 388 | Disposition: A | Discharge status: Home / Self Care | Payer: Federal, State, Local not specified - PPO | Attending: Internal Medicine | Admitting: Internal Medicine

## 2013-12-22 ENCOUNTER — Emergency Department (HOSPITAL_COMMUNITY): Payer: Federal, State, Local not specified - PPO

## 2013-12-22 DIAGNOSIS — E41 Nutritional marasmus: Secondary | ICD-10-CM | POA: Diagnosis present

## 2013-12-22 DIAGNOSIS — Z8042 Family history of malignant neoplasm of prostate: Secondary | ICD-10-CM

## 2013-12-22 DIAGNOSIS — T451X5A Adverse effect of antineoplastic and immunosuppressive drugs, initial encounter: Secondary | ICD-10-CM | POA: Diagnosis present

## 2013-12-22 DIAGNOSIS — R634 Abnormal weight loss: Secondary | ICD-10-CM | POA: Diagnosis present

## 2013-12-22 DIAGNOSIS — K219 Gastro-esophageal reflux disease without esophagitis: Secondary | ICD-10-CM | POA: Diagnosis present

## 2013-12-22 DIAGNOSIS — C772 Secondary and unspecified malignant neoplasm of intra-abdominal lymph nodes: Secondary | ICD-10-CM

## 2013-12-22 DIAGNOSIS — E46 Unspecified protein-calorie malnutrition: Secondary | ICD-10-CM | POA: Diagnosis present

## 2013-12-22 DIAGNOSIS — M109 Gout, unspecified: Secondary | ICD-10-CM | POA: Diagnosis present

## 2013-12-22 DIAGNOSIS — Z8249 Family history of ischemic heart disease and other diseases of the circulatory system: Secondary | ICD-10-CM

## 2013-12-22 DIAGNOSIS — E119 Type 2 diabetes mellitus without complications: Secondary | ICD-10-CM | POA: Diagnosis present

## 2013-12-22 DIAGNOSIS — G620 Drug-induced polyneuropathy: Secondary | ICD-10-CM | POA: Diagnosis present

## 2013-12-22 DIAGNOSIS — M129 Arthropathy, unspecified: Secondary | ICD-10-CM | POA: Diagnosis present

## 2013-12-22 DIAGNOSIS — C189 Malignant neoplasm of colon, unspecified: Secondary | ICD-10-CM | POA: Diagnosis present

## 2013-12-22 DIAGNOSIS — C786 Secondary malignant neoplasm of retroperitoneum and peritoneum: Secondary | ICD-10-CM | POA: Diagnosis present

## 2013-12-22 DIAGNOSIS — R197 Diarrhea, unspecified: Secondary | ICD-10-CM | POA: Diagnosis present

## 2013-12-22 DIAGNOSIS — Z833 Family history of diabetes mellitus: Secondary | ICD-10-CM

## 2013-12-22 DIAGNOSIS — D649 Anemia, unspecified: Secondary | ICD-10-CM | POA: Diagnosis present

## 2013-12-22 DIAGNOSIS — Z9849 Cataract extraction status, unspecified eye: Secondary | ICD-10-CM

## 2013-12-22 DIAGNOSIS — Z79899 Other long term (current) drug therapy: Secondary | ICD-10-CM

## 2013-12-22 DIAGNOSIS — Z9089 Acquired absence of other organs: Secondary | ICD-10-CM

## 2013-12-22 DIAGNOSIS — R630 Anorexia: Secondary | ICD-10-CM | POA: Diagnosis present

## 2013-12-22 DIAGNOSIS — R1013 Epigastric pain: Secondary | ICD-10-CM

## 2013-12-22 DIAGNOSIS — G8929 Other chronic pain: Secondary | ICD-10-CM | POA: Diagnosis present

## 2013-12-22 DIAGNOSIS — M549 Dorsalgia, unspecified: Secondary | ICD-10-CM | POA: Diagnosis present

## 2013-12-22 DIAGNOSIS — Z91038 Other insect allergy status: Secondary | ICD-10-CM

## 2013-12-22 DIAGNOSIS — C801 Malignant (primary) neoplasm, unspecified: Secondary | ICD-10-CM

## 2013-12-22 DIAGNOSIS — C779 Secondary and unspecified malignant neoplasm of lymph node, unspecified: Secondary | ICD-10-CM

## 2013-12-22 DIAGNOSIS — M1990 Unspecified osteoarthritis, unspecified site: Secondary | ICD-10-CM

## 2013-12-22 DIAGNOSIS — K3189 Other diseases of stomach and duodenum: Secondary | ICD-10-CM | POA: Diagnosis not present

## 2013-12-22 DIAGNOSIS — F17200 Nicotine dependence, unspecified, uncomplicated: Secondary | ICD-10-CM | POA: Diagnosis present

## 2013-12-22 DIAGNOSIS — C50919 Malignant neoplasm of unspecified site of unspecified female breast: Secondary | ICD-10-CM | POA: Diagnosis present

## 2013-12-22 DIAGNOSIS — C800 Disseminated malignant neoplasm, unspecified: Secondary | ICD-10-CM

## 2013-12-22 DIAGNOSIS — J4489 Other specified chronic obstructive pulmonary disease: Secondary | ICD-10-CM | POA: Diagnosis present

## 2013-12-22 DIAGNOSIS — K56609 Unspecified intestinal obstruction, unspecified as to partial versus complete obstruction: Secondary | ICD-10-CM | POA: Diagnosis present

## 2013-12-22 DIAGNOSIS — E876 Hypokalemia: Secondary | ICD-10-CM | POA: Diagnosis not present

## 2013-12-22 DIAGNOSIS — E785 Hyperlipidemia, unspecified: Secondary | ICD-10-CM | POA: Diagnosis present

## 2013-12-22 DIAGNOSIS — K5669 Other intestinal obstruction: Principal | ICD-10-CM | POA: Diagnosis present

## 2013-12-22 DIAGNOSIS — I10 Essential (primary) hypertension: Secondary | ICD-10-CM | POA: Diagnosis present

## 2013-12-22 DIAGNOSIS — C762 Malignant neoplasm of abdomen: Secondary | ICD-10-CM

## 2013-12-22 DIAGNOSIS — F341 Dysthymic disorder: Secondary | ICD-10-CM | POA: Diagnosis present

## 2013-12-22 DIAGNOSIS — Z880 Allergy status to penicillin: Secondary | ICD-10-CM

## 2013-12-22 DIAGNOSIS — Z885 Allergy status to narcotic agent status: Secondary | ICD-10-CM

## 2013-12-22 DIAGNOSIS — J449 Chronic obstructive pulmonary disease, unspecified: Secondary | ICD-10-CM | POA: Diagnosis present

## 2013-12-22 LAB — URINALYSIS, ROUTINE W REFLEX MICROSCOPIC
Glucose, UA: NEGATIVE mg/dL
HGB URINE DIPSTICK: NEGATIVE
Ketones, ur: NEGATIVE mg/dL
Leukocytes, UA: NEGATIVE
Nitrite: NEGATIVE
Protein, ur: NEGATIVE mg/dL
SPECIFIC GRAVITY, URINE: 1.022 (ref 1.005–1.030)
UROBILINOGEN UA: 1 mg/dL (ref 0.0–1.0)
pH: 5 (ref 5.0–8.0)

## 2013-12-22 LAB — COMPREHENSIVE METABOLIC PANEL
ALT: 36 U/L (ref 0–53)
AST: 40 U/L — ABNORMAL HIGH (ref 0–37)
Albumin: 3.8 g/dL (ref 3.5–5.2)
Alkaline Phosphatase: 96 U/L (ref 39–117)
BUN: 8 mg/dL (ref 6–23)
CO2: 24 mEq/L (ref 19–32)
Calcium: 9.4 mg/dL (ref 8.4–10.5)
Chloride: 98 mEq/L (ref 96–112)
Creatinine, Ser: 0.9 mg/dL (ref 0.50–1.35)
GFR calc Af Amer: >90 mL/min (ref >90)
GFR calc non Af Amer: >90 mL/min (ref >90)
Glucose, Bld: 117 mg/dL — ABNORMAL HIGH (ref 70–99)
Potassium: 4.1 mEq/L (ref 3.7–5.3)
SODIUM: 137 meq/L (ref 137–147)
Total Bilirubin: 0.6 mg/dL (ref 0.3–1.2)
Total Protein: 7.3 g/dL (ref 6.0–8.3)

## 2013-12-22 LAB — CBC WITH DIFFERENTIAL/PLATELET
Basophils Absolute: 0 10*3/uL (ref 0.0–0.1)
Basophils Relative: 0 % (ref 0–1)
EOS PCT: 2 % (ref 0–5)
Eosinophils Absolute: 0.2 10*3/uL (ref 0.0–0.7)
HCT: 42.9 % (ref 39.0–52.0)
Hemoglobin: 14.4 g/dL (ref 13.0–17.0)
Lymphocytes Relative: 22 % (ref 12–46)
Lymphs Abs: 2.2 10*3/uL (ref 0.7–4.0)
MCH: 29.3 pg (ref 26.0–34.0)
MCHC: 33.6 g/dL (ref 30.0–36.0)
MCV: 87.4 fL (ref 78.0–100.0)
Monocytes Absolute: 0.8 10*3/uL (ref 0.1–1.0)
Monocytes Relative: 8 % (ref 3–12)
Neutro Abs: 7.1 10*3/uL (ref 1.7–7.7)
Neutrophils Relative %: 68 % (ref 43–77)
Platelets: 237 10*3/uL (ref 150–400)
RBC: 4.91 MIL/uL (ref 4.22–5.81)
RDW: 13.7 % (ref 11.5–15.5)
WBC: 10.3 10*3/uL (ref 4.0–10.5)

## 2013-12-22 LAB — LIPASE, BLOOD: Lipase: 18 U/L (ref 11–59)

## 2013-12-22 LAB — GLUCOSE, CAPILLARY: GLUCOSE-CAPILLARY: 101 mg/dL — AB (ref 70–99)

## 2013-12-22 LAB — URIC ACID: Uric Acid, Serum: 6.4 mg/dL (ref 4.0–7.8)

## 2013-12-22 MED ORDER — ONDANSETRON HCL 4 MG/2ML IJ SOLN
4.0000 mg | Freq: Once | INTRAMUSCULAR | Status: AC
  Administered 2013-12-22: 4 mg via INTRAVENOUS
  Filled 2013-12-22: qty 2

## 2013-12-22 MED ORDER — SODIUM CHLORIDE 0.9 % IV SOLN
Freq: Once | INTRAVENOUS | Status: AC
  Administered 2013-12-22: via INTRAVENOUS

## 2013-12-22 MED ORDER — FENTANYL 75 MCG/HR TD PT72
75.0000 ug | MEDICATED_PATCH | TRANSDERMAL | Status: DC
  Administered 2013-12-23 – 2013-12-29 (×3): 75 ug via TRANSDERMAL
  Filled 2013-12-22 (×3): qty 1

## 2013-12-22 MED ORDER — ALBUTEROL SULFATE (2.5 MG/3ML) 0.083% IN NEBU: 2.5000 mg | INHALATION_SOLUTION | Freq: Four times a day (QID) | RESPIRATORY_TRACT | Status: DC | PRN

## 2013-12-22 MED ORDER — HYDROMORPHONE HCL PF 1 MG/ML IJ SOLN
1.0000 mg | Freq: Once | INTRAMUSCULAR | Status: AC
  Administered 2013-12-22: 1 mg via INTRAVENOUS
  Filled 2013-12-22: qty 1

## 2013-12-22 MED ORDER — HYDROMORPHONE HCL PF 1 MG/ML IJ SOLN
1.0000 mg | INTRAMUSCULAR | Status: DC | PRN
  Administered 2013-12-22 – 2013-12-23 (×4): 1 mg via INTRAVENOUS
  Filled 2013-12-22 (×5): qty 1

## 2013-12-22 MED ORDER — NICOTINE 21 MG/24HR TD PT24
21.0000 mg | MEDICATED_PATCH | Freq: Every day | TRANSDERMAL | Status: DC
  Administered 2013-12-23 – 2013-12-28 (×6): 21 mg via TRANSDERMAL
  Filled 2013-12-22 (×8): qty 1

## 2013-12-22 MED ORDER — ACETAMINOPHEN 650 MG RE SUPP: 650.0000 mg | Freq: Four times a day (QID) | RECTAL | Status: DC | PRN

## 2013-12-22 MED ORDER — LORAZEPAM 2 MG/ML IJ SOLN
0.5000 mg | Freq: Four times a day (QID) | INTRAMUSCULAR | Status: DC | PRN
  Administered 2013-12-22 – 2013-12-29 (×8): 0.5 mg via INTRAVENOUS
  Filled 2013-12-22 (×8): qty 1

## 2013-12-22 MED ORDER — SODIUM CHLORIDE 0.9 % IJ SOLN
INTRAMUSCULAR | Status: AC
  Administered 2013-12-22: 18:00:00
  Filled 2013-12-22: qty 10

## 2013-12-22 MED ORDER — SODIUM CHLORIDE 0.9 % IV SOLN
Freq: Once | INTRAVENOUS | Status: AC
  Administered 2013-12-22: 18:00:00 via INTRAVENOUS

## 2013-12-22 MED ORDER — ACETAMINOPHEN 325 MG PO TABS: 650.0000 mg | ORAL_TABLET | Freq: Four times a day (QID) | ORAL | Status: DC | PRN

## 2013-12-22 MED ORDER — ENOXAPARIN SODIUM 40 MG/0.4ML ~~LOC~~ SOLN
40.0000 mg | Freq: Every day | SUBCUTANEOUS | Status: DC
  Administered 2013-12-23: 40 mg via SUBCUTANEOUS
  Filled 2013-12-22 (×2): qty 0.4

## 2013-12-22 MED ORDER — INSULIN ASPART 100 UNIT/ML ~~LOC~~ SOLN
0.0000 [IU] | SUBCUTANEOUS | Status: DC
  Administered 2013-12-25: 1 [IU] via SUBCUTANEOUS

## 2013-12-22 MED ORDER — ONDANSETRON HCL 4 MG/2ML IJ SOLN
4.0000 mg | Freq: Four times a day (QID) | INTRAMUSCULAR | Status: DC | PRN
  Administered 2013-12-22 – 2013-12-23 (×2): 4 mg via INTRAVENOUS
  Filled 2013-12-22 (×2): qty 2

## 2013-12-22 MED ORDER — PANTOPRAZOLE SODIUM 40 MG IV SOLR
40.0000 mg | Freq: Every day | INTRAVENOUS | Status: DC
  Administered 2013-12-23 – 2013-12-28 (×7): 40 mg via INTRAVENOUS
  Filled 2013-12-22 (×9): qty 40

## 2013-12-22 NOTE — ED Notes (Signed)
Pt has a urinal at the bedside. Pt says he does not have to urinate at all, said he hasn't been able to drink very much and urinated before he came to the hospital. Pt was instructed to notify staff when he has voided.

## 2013-12-22 NOTE — ED Notes (Signed)
Pt is still unable to urinate. Urinal is at the bedside.

## 2013-12-22 NOTE — ED Provider Notes (Signed)
Patient has a history of metastatic colon cancer. He reports recent admission for bowel obstruction. He reports within a couple days after discharge she started having symptoms develop again. He states it started out with belching which he states has a foul smell and taste like rotten eggs. He also describes a lot of watery stool daily. He states his abdomen started getting distended shortly after being discharged.  Patient looks chronically ill. His abdomen is distended but is soft. His NG tube is in place. At this time he does not have any acute surgical abdomen.  Medical screening examination/treatment/procedure(s) were conducted as a shared visit with non-physician practitioner(s) and myself.  I personally evaluated the patient during the encounter.   EKG Interpretation None       Rolland Porter, MD, [facility]   Janice Norrie, MD 12/22/13 [phone]

## 2013-12-22 NOTE — ED Notes (Signed)
MD at bedside.  Frank Baird

## 2013-12-22 NOTE — H&P (Signed)
PCP:  Odette Fraction, MD  Oncology : Benay Spice  Chief Complaint:  Abdominal pain and distention HPI: Frank Baird is a 53 y.o. male   has a past medical history of RUQ pain; Complication of anesthesia; Arthritis; GERD (gastroesophageal reflux disease); Diabetes mellitus; Hyperlipidemia; Hypertension; History of gout; COPD (chronic obstructive pulmonary disease); Anxiety; Depression; and Cancer.   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 Benay Spice  receiving FOLFIRI. Had recent admition for SBO that was treated conservatively. He had a surgical consult done at that time and thought that SBO was due to progressive carcinomatosis with multiple points of intestinal involvement. NO operative intervention was recommended at this point.   Patient presents with 3 day hx of abdominal pain nausea and watery BM. He states although he felt better initially after discharge the belching started back soon there after.  Patient recognized symtoms of SBO and attempted to decrease PO intake but continued to have symptoms. He presented to [facility] ER and was found to have SBO. NG was placed and surgical consult was called but recommend further discussion with oncology given heavy tumor burden.   Of note patient has mentioned some right ankle pain that he has attributed to gout that got better with a dose of prednisone  Hospitalist was called for admission for SBO  Review of Systems:    Pertinent positives include:  abdominal pain, nausea, vomiting, diarrhea, Last BM watery in AM  Constitutional:  No weight loss, night sweats, Fevers, chills, fatigue, weight loss  HEENT:  No headaches, Difficulty swallowing,Tooth/dental problems,Sore throat,  No sneezing, itching, ear ache, nasal congestion, post nasal drip,  Cardio-vascular:  No chest pain, Orthopnea, PND, anasarca, dizziness, palpitations.no Bilateral lower extremity swelling    GI:  No heartburn, indigestion, change in bowel habits, loss of appetite, melena, blood in stool, hematemesis Resp:  no shortness of breath at rest. No dyspnea on exertion, No excess mucus, no productive cough, No non-productive cough, No coughing up of blood.No change in color of mucus.No wheezing. Skin:  no rash or lesions. No jaundice GU:  no dysuria, change in color of urine, no urgency or frequency. No straining to urinate.  No flank pain.  Musculoskeletal:  No joint pain or no joint swelling. No decreased range of motion. No back pain.  Psych:  No change in mood or affect. No depression or anxiety. No memory loss.  Neuro: no localizing neurological complaints, no tingling, no weakness, no double vision, no gait abnormality, no slurred speech, no confusion  Otherwise ROS are negative except for above, 10 systems were reviewed  Past Medical History: Past Medical History  Diagnosis Date  . RUQ pain   . Complication of anesthesia     difficulty waking up  . Arthritis   . GERD (gastroesophageal reflux disease)   . Diabetes mellitus   . Hyperlipidemia   . Hypertension   . History of gout   . COPD (chronic obstructive pulmonary disease)   . Anxiety   . Depression   . Cancer     colon ca   Past Surgical History  Procedure Laterality Date  . Shoulder surgery  2012    right shoulder rotator cuff  . Nasal sinus surgery  2000  . Hernia repair      umbilical and right inguinal  . Elbow surgery      right elbow ligament  . Eye surgery      bilateral cataract surgery  .  Cholecystectomy  09/26/2011    Procedure: LAPAROSCOPIC CHOLECYSTECTOMY WITH INTRAOPERATIVE CHOLANGIOGRAM;  Surgeon: Edward Jolly, MD;  Location: [place];  Service: General;  Laterality: N/A;  . Laparoscopic appendectomy N/A 11/12/2012    Procedure: DIAGNOSTIC LAPAROSCOPY, OPEN EXPLORATORY LAPAROTOMY, INCISIONAL BIOPSIES OF CALICFORM LIGAMENT, MESSENTERY, UMBILICAL LIGAMENT, BAND OF DUODENUM;  Surgeon:  Madilyn Hook, DO;  Location: [facility];  Service: General;  Laterality: N/A;  . Portacath placement N/A 12/02/2012    Procedure: INSERTION PORT-A-CATH;  Surgeon: Madilyn Hook, DO;  Location: [facility];  Service: General;  Laterality: N/A;  with xray and ultrasound      Medications: Prior to Admission medications   Medication Sig Start Date End Date Taking? Authorizing Provider  albuterol (PROVENTIL HFA;VENTOLIN HFA) 108 (90 BASE) MCG/ACT inhaler Inhale 2 puffs into the lungs every 6 (six) hours as needed for wheezing. 12/06/12  Yes Susy Frizzle, MD  diphenoxylate-atropine (LOMOTIL) 2.5-0.025 MG per tablet Take 1 tablet by mouth 4 (four) times daily as needed for diarrhea or loose stools. 01/08/13  Yes Owens Shark, NP  esomeprazole (NEXIUM) 40 MG capsule Take 1 capsule (40 mg total) by mouth daily before breakfast. 12/11/12  Yes Susy Frizzle, MD  fentaNYL (DURAGESIC - DOSED MCG/HR) 75 MCG/HR Place 1 patch (75 mcg total) onto the skin every 3 (three) days. 11/06/13  Yes Ladell Pier, MD  HYDROmorphone (DILAUDID) 4 MG tablet Take 1 tablet (4 mg total) by mouth every 4 (four) hours as needed for moderate pain or severe pain. 12/15/13  Yes Theodis Blaze, MD  lidocaine-prilocaine (EMLA) cream Apply to port a cath site one hour prior to chemo. Do not rub in. Cover with plastic. 10/09/13  Yes Ladell Pier, MD  lisinopril (PRINIVIL,ZESTRIL) 20 MG tablet Take 20 mg by mouth daily.   Yes Historical Provider, MD  LORazepam (ATIVAN) 1 MG tablet Take 1 mg by mouth every 6 (six) hours as needed for anxiety or sleep.   Yes Historical Provider, MD  metoCLOPramide (REGLAN) 10 MG tablet Take 1 tablet (10 mg total) by mouth 3 (three) times daily before meals. 06/02/13  Yes Ladell Pier, MD  nicotine (NICODERM CQ - DOSED IN MG/24 HOURS) 21 mg/24hr patch Place 21 mg onto the skin daily.   Yes Historical Provider, MD  ondansetron (ZOFRAN) 8 MG tablet Take 1 tablet (8 mg total) by mouth every 8 (eight) hours as needed  for nausea. 12/25/12  Yes Owens Shark, NP  Oxycodone HCl 10 MG TABS Take 10 mg by mouth every 4 (four) hours as needed (pain).   Yes Historical Provider, MD  pioglitazone (ACTOS) 30 MG tablet Take 30 mg by mouth daily.   Yes Historical Provider, MD    Allergies:   Allergies  Allergen Reactions  . Bee Venom     Unsure of reaction, Stung as baby and almost died  . Codeine     Bad stomach cramps    . Penicillins Other (See Comments)    unknown    Social History:  Ambulatory  independently   Lives at home   With family     reports that he has been smoking.  He has never used smokeless tobacco. He reports that he drinks alcohol. He reports that he does not use illicit drugs.    Family History: family history includes Diabetes type II in his mother; Heart disease in his mother; Prostate cancer in his father.    Physical Exam: Patient Vitals for the past  24 hrs:  BP Temp Temp src Pulse Resp SpO2  12/22/13 1956 126/71 mmHg - - 51 14 95 %  12/22/13 1500 128/77 mmHg 98.5 F (36.9 C) Oral 57 20 98 %    1. General:  in No Acute distress 2. Psychological: Alert and Oriented 3. Head/ENT:     Dry Mucous Membranes NG in place                          Head Non traumatic, neck supple                          Normal Dentition 4. SKIN:  decreased Skin turgor,  Skin clean Dry and intact no rash 5. Heart: Regular rate and rhythm no Murmur, Rub or gallop 6. Lungs: Clear to auscultation bilaterally, no wheezes or crackles   7. Abdomen:     Tender, somewhat distended, NG in place 8. Lower extremities: no clubbing, cyanosis, or edema 9. Neurologically Grossly intact, moving all 4 extremities equally 10. MSK: Normal range of motion  body mass index is unknown because there is no weight on file.   Labs on Admission:   Recent Labs  12/22/13 1542  NA 137  K 4.1  CL 98  CO2 24  GLUCOSE 117*  BUN 8  CREATININE 0.90  CALCIUM 9.4    Recent Labs  12/22/13 1542  AST 40*  ALT 36   ALKPHOS 96  BILITOT 0.6  PROT 7.3  ALBUMIN 3.8    Recent Labs  12/22/13 1542  LIPASE 18    Recent Labs  12/22/13 1542  WBC 10.3  NEUTROABS 7.1  HGB 14.4  HCT 42.9  MCV 87.4  PLT 237   No results found for this basename: CKTOTAL, CKMB, CKMBINDEX, TROPONINI,  in the last 72 hours No results found for this basename: TSH, T4TOTAL, FREET3, T3FREE, THYROIDAB,  in the last 72 hours No results found for this basename: VITAMINB12, FOLATE, FERRITIN, TIBC, IRON, RETICCTPCT,  in the last 72 hours Lab Results  Component Value Date   HGBA1C 7.6* 11/08/2012    The CrCl is unknown because both a height and weight (above a minimum accepted value) are required for this calculation. ABG No results found for this basename: phart, pco2, po2, hco3, tco2, acidbasedef, o2sat     No results found for this basename: DDIMER      BNP (last 3 results) No results found for this basename: PROBNP,  in the last 8760 hours  There were no vitals filed for this visit.   Cultures:    Component Value Date/Time   SDES STOOL 12/14/2013 1421   SPECREQUEST NONE 12/14/2013 1421   CULT  Value: NO SALMONELLA, SHIGELLA, CAMPYLOBACTER, YERSINIA, OR E.COLI 0157:H7 ISOLATED Performed at [facility] 12/14/2013 1421   REPTSTATUS 12/18/2013 FINAL 12/14/2013 1421         Radiological Exams on Admission: Dg Abd Acute W/chest  12/22/2013   CLINICAL DATA:  Abdominal pain, nausea.  EXAM: ACUTE ABDOMEN SERIES (ABDOMEN 2 VIEW & CHEST 1 VIEW)  COMPARISON:  12/12/2013.  FINDINGS: Left Port-A-Cath is in place with the tip in the SVC. Heart is normal size. Lungs are clear. No effusions.  Dilated small bowel loops with air-fluid levels throughout the abdomen, worsening since prior study. Decreasing colonic gas. No free air organomegaly. Prior cholecystectomy.  IMPRESSION: Worsening small bowel distention and air-fluid levels compatible with worsening small bowel obstruction.  No acute cardiopulmonary disease.    Electronically Signed   By: Rolm Baptise M.D.   On: 12/22/2013 16:04    Chart has been reviewed  Assessment/Plan  53 yo M with hx of metastatic adenocarcinoma resulting in carcinomatosis and SBO  Present on Admission:  . Hypertension - currently well controlled continue to monitor . SBO (small bowel obstruction) - Discuss overall prognosis with oncology in AM given diffuse disease. Patient seems anaware of his prognosis. For now keep NPO, continue NG suctioning . Type 2 diabetes mellitus -SSI . Colon cancer metastasized to intra-abdominal lymph node - oncology consult in AM . Gout - obtain uric acid level, given malignancy will eval for DVT as pat of differential for leg swelling currently appears improved.    Prophylaxis: lovenox, Protonix  CODE STATUS:  FULL CODE  Other plan as per orders.  I have spent a total of 55 min on this admission  DOUTOVA,ANASTASSIA 12/22/2013, 8:30 PM  [facility]  Pager [phone]   If 7AM-7PM, please contact the day team taking care of the patient  [email or web address]  Password [email or web address]

## 2013-12-22 NOTE — ED Notes (Signed)
Called the floor to give report.  Primary nurse is in a pt's room and will return call.

## 2013-12-22 NOTE — ED Provider Notes (Signed)
CSN: [id number]     Arrival date & time 12/22/13  1447 History   First MD Initiated Contact with Patient 12/22/13 1756     Chief Complaint  Patient presents with  . Abdominal Pain     (Consider location/radiation/quality/duration/timing/severity/associated sxs/prior Treatment) HPI Pt is a 53yo male with hx of colon cancer presenting to ED with worsening abdominal pain associated with belching, nausea and diarrhea after being discharged on 12/15/13 for SBO.   Pt states pain is constant, stabbing, worse than the pain prior to initial SBO dx on 6/11.  Pt has taken his home pain medication, fentanyl patch as well as dilaudid and oxycodone w/o relief.  Denies fevers. Denies blood in urine or stool.  Reports hx of abdominal and pelvic peritoneal carcinomatosis and multiple abdominal surgeries.  Denies hx of C. Diff.    Past Medical History  Diagnosis Date  . RUQ pain   . Complication of anesthesia     difficulty waking up  . Arthritis   . GERD (gastroesophageal reflux disease)   . Diabetes mellitus   . Hyperlipidemia   . Hypertension   . History of gout   . COPD (chronic obstructive pulmonary disease)   . Anxiety   . Depression   . Cancer     colon ca   Past Surgical History  Procedure Laterality Date  . Shoulder surgery  2012    right shoulder rotator cuff  . Nasal sinus surgery  2000  . Hernia repair      umbilical and right inguinal  . Elbow surgery      right elbow ligament  . Eye surgery      bilateral cataract surgery  . Cholecystectomy  09/26/2011    Procedure: LAPAROSCOPIC CHOLECYSTECTOMY WITH INTRAOPERATIVE CHOLANGIOGRAM;  Surgeon: Edward Jolly, MD;  Location: [place];  Service: General;  Laterality: N/A;  . Laparoscopic appendectomy N/A 11/12/2012    Procedure: DIAGNOSTIC LAPAROSCOPY, OPEN EXPLORATORY LAPAROTOMY, INCISIONAL BIOPSIES OF CALICFORM LIGAMENT, MESSENTERY, UMBILICAL LIGAMENT, BAND OF DUODENUM;  Surgeon: Madilyn Hook, DO;  Location: [facility];  Service:  General;  Laterality: N/A;  . Portacath placement N/A 12/02/2012    Procedure: INSERTION PORT-A-CATH;  Surgeon: Madilyn Hook, DO;  Location: [facility];  Service: General;  Laterality: N/A;  with xray and ultrasound    Family History  Problem Relation Age of Onset  . Heart disease Mother   . Diabetes type II Mother   . Prostate cancer Father    History  Substance Use Topics  . Smoking status: Current Every Day Smoker -- 1.00 packs/day  . Smokeless tobacco: Never Used  . Alcohol Use: Yes     Comment: rarely 0 to 1-2 per week    Review of Systems  Constitutional: Negative for fever and chills.  Gastrointestinal: Positive for nausea, abdominal pain and diarrhea. Negative for vomiting, constipation and blood in stool.  Genitourinary: Negative for dysuria and hematuria.  Musculoskeletal: Negative for back pain and myalgias.  All other systems reviewed and are negative.     Allergies  Bee venom; Codeine; and Penicillins  Home Medications   Prior to Admission medications   Medication Sig Start Date End Date Taking? Authorizing Provider  albuterol (PROVENTIL HFA;VENTOLIN HFA) 108 (90 BASE) MCG/ACT inhaler Inhale 2 puffs into the lungs every 6 (six) hours as needed for wheezing. 12/06/12  Yes Susy Frizzle, MD  diphenoxylate-atropine (LOMOTIL) 2.5-0.025 MG per tablet Take 1 tablet by mouth 4 (four) times daily as needed for diarrhea or loose  stools. 01/08/13  Yes Owens Shark, NP  esomeprazole (NEXIUM) 40 MG capsule Take 1 capsule (40 mg total) by mouth daily before breakfast. 12/11/12  Yes Susy Frizzle, MD  fentaNYL (DURAGESIC - DOSED MCG/HR) 75 MCG/HR Place 1 patch (75 mcg total) onto the skin every 3 (three) days. 11/06/13  Yes Ladell Pier, MD  HYDROmorphone (DILAUDID) 4 MG tablet Take 1 tablet (4 mg total) by mouth every 4 (four) hours as needed for moderate pain or severe pain. 12/15/13  Yes Theodis Blaze, MD  lidocaine-prilocaine (EMLA) cream Apply to port a cath site one hour  prior to chemo. Do not rub in. Cover with plastic. 10/09/13  Yes Ladell Pier, MD  lisinopril (PRINIVIL,ZESTRIL) 20 MG tablet Take 20 mg by mouth daily.   Yes Historical Provider, MD  LORazepam (ATIVAN) 1 MG tablet Take 1 mg by mouth every 6 (six) hours as needed for anxiety or sleep.   Yes Historical Provider, MD  metoCLOPramide (REGLAN) 10 MG tablet Take 1 tablet (10 mg total) by mouth 3 (three) times daily before meals. 06/02/13  Yes Ladell Pier, MD  nicotine (NICODERM CQ - DOSED IN MG/24 HOURS) 21 mg/24hr patch Place 21 mg onto the skin daily.   Yes Historical Provider, MD  ondansetron (ZOFRAN) 8 MG tablet Take 1 tablet (8 mg total) by mouth every 8 (eight) hours as needed for nausea. 12/25/12  Yes Owens Shark, NP  Oxycodone HCl 10 MG TABS Take 10 mg by mouth every 4 (four) hours as needed (pain).   Yes Historical Provider, MD  pioglitazone (ACTOS) 30 MG tablet Take 30 mg by mouth daily.   Yes Historical Provider, MD   BP 126/71  Pulse 51  Temp(Src) 98.5 F (36.9 C) (Oral)  Resp 14  SpO2 94% Physical Exam  Nursing note and vitals reviewed. Constitutional: He appears well-developed and well-nourished.  Pt lying in exam bed, rubbing his abdomen. Appears uncomfortable.   HENT:  Head: Normocephalic and atraumatic.  Eyes: Conjunctivae are normal. No scleral icterus.  Neck: Normal range of motion.  Cardiovascular: Normal rate, regular rhythm and normal heart sounds.   Pulmonary/Chest: Effort normal and breath sounds normal. No respiratory distress. He has no wheezes. He has no rales. He exhibits no tenderness.  Abdominal: Soft. He exhibits distension. He exhibits no mass. Bowel sounds are increased. There is generalized tenderness. There is rebound and guarding.  Musculoskeletal: Normal range of motion.  Neurological: He is alert.  Skin: Skin is warm and dry.    ED Course  Procedures (including critical care time) Labs Review Labs Reviewed  COMPREHENSIVE METABOLIC PANEL -  Abnormal; Notable for the following:    Glucose, Bld 117 (*)    AST 40 (*)    All other components within normal limits  CBC WITH DIFFERENTIAL  LIPASE, BLOOD  URINALYSIS, ROUTINE W REFLEX MICROSCOPIC    Imaging Review Dg Abd Acute W/chest  12/22/2013   CLINICAL DATA:  Abdominal pain, nausea.  EXAM: ACUTE ABDOMEN SERIES (ABDOMEN 2 VIEW & CHEST 1 VIEW)  COMPARISON:  12/12/2013.  FINDINGS: Left Port-A-Cath is in place with the tip in the SVC. Heart is normal size. Lungs are clear. No effusions.  Dilated small bowel loops with air-fluid levels throughout the abdomen, worsening since prior study. Decreasing colonic gas. No free air organomegaly. Prior cholecystectomy.  IMPRESSION: Worsening small bowel distention and air-fluid levels compatible with worsening small bowel obstruction.  No acute cardiopulmonary disease.   Electronically Signed  By: Rolm Baptise M.D.   On: 12/22/2013 16:04     EKG Interpretation None      MDM   Final diagnoses:  Carcinomatosis  SBO (small bowel obstruction)  Type 2 diabetes mellitus without complication    Pt is a 69CV male with abdominal and pelvic peritoneal carcinomatosis discharged for SBO on 6/15 and tolerating a regular diet at that time presenting to ED with worsening abdominal pain and distention.  Soft distended abdomen, with diffuse tenderness. Bowel sounds increased in LLQ.  Labs: mildly elevated AST, otherwise unremarkable.  Acute Abd w/Chest: worsening small bowel distention and air-fluid levels compatible with worsening SBO.    Discussed pt with Dr. Rolland Porter, NG tube placed in ED, pain and nausea managed with IV dilaudid and zofran.  Pt was evaluated by surgery and oncology during last visit on 6/11 for same.  Will consult medicine to admit pt.    7:35 PM Consulted with Dr. Roel Cluck, internal medicine. Agreed pt does need to be admitted, however, requested general surgery consult.    7:54 PM Consulted with Dr. Zella Richer, who states general  surgery is familiar with the pt and believes the next step in pt's tx plan would be for pt to discuss options with his oncologist, Dr. Benay Spice to determine if surgery would be of any benefit as there is likely multiple areas of obstructions.    Pt will be admitted by Dr. Roel Cluck to med-surg.        Noland Fordyce, PA-C 12/22/13 2208

## 2013-12-22 NOTE — ED Provider Notes (Signed)
See prior note   Janice Norrie, MD 12/22/13 2231

## 2013-12-22 NOTE — ED Notes (Signed)
Pt c/o abd pain since he was released from the hospital on 6/15. Pt was admitted with bowel obstruction. Pt had BM this morning but it was diarrhea. C/o nausea but no vomiting.

## 2013-12-22 NOTE — Telephone Encounter (Signed)
Pt called reporting "I was in the hospital week and 1/2 ago and now I'm worse"  States he was discharged on solid diet but started having pain/lot of gas this weekend and went to soft diet---experienced more pain, nausea, diarrhea.  Now on liquid diet and still having a lot of pain; hiccupping, belching---pain "10" on pain scale of 1-10; continues to have nausea but no vomiting and has had diarrhea.  After discussion with Dr. Benay Spice; notified pt to go to ED for evaluation per MD.  Pt verbalized understanding of instructions.

## 2013-12-22 NOTE — ED Notes (Signed)
Family at bedside. 

## 2013-12-23 ENCOUNTER — Encounter (HOSPITAL_COMMUNITY): Payer: Self-pay

## 2013-12-23 DIAGNOSIS — E46 Unspecified protein-calorie malnutrition: Secondary | ICD-10-CM

## 2013-12-23 DIAGNOSIS — C801 Malignant (primary) neoplasm, unspecified: Secondary | ICD-10-CM

## 2013-12-23 DIAGNOSIS — M7989 Other specified soft tissue disorders: Secondary | ICD-10-CM

## 2013-12-23 DIAGNOSIS — C772 Secondary and unspecified malignant neoplasm of intra-abdominal lymph nodes: Secondary | ICD-10-CM

## 2013-12-23 DIAGNOSIS — C800 Disseminated malignant neoplasm, unspecified: Secondary | ICD-10-CM

## 2013-12-23 DIAGNOSIS — D649 Anemia, unspecified: Secondary | ICD-10-CM

## 2013-12-23 DIAGNOSIS — M129 Arthropathy, unspecified: Secondary | ICD-10-CM

## 2013-12-23 DIAGNOSIS — K56609 Unspecified intestinal obstruction, unspecified as to partial versus complete obstruction: Secondary | ICD-10-CM

## 2013-12-23 DIAGNOSIS — M79609 Pain in unspecified limb: Secondary | ICD-10-CM

## 2013-12-23 LAB — COMPREHENSIVE METABOLIC PANEL
ALT: 31 U/L (ref 0–53)
AST: 31 U/L (ref 0–37)
Albumin: 3.2 g/dL — ABNORMAL LOW (ref 3.5–5.2)
Alkaline Phosphatase: 84 U/L (ref 39–117)
BILIRUBIN TOTAL: 0.8 mg/dL (ref 0.3–1.2)
BUN: 7 mg/dL (ref 6–23)
CHLORIDE: 103 meq/L (ref 96–112)
CO2: 27 meq/L (ref 19–32)
CREATININE: 0.84 mg/dL (ref 0.50–1.35)
Calcium: 8.5 mg/dL (ref 8.4–10.5)
GFR calc Af Amer: >90 mL/min (ref >90)
GFR calc non Af Amer: >90 mL/min (ref >90)
Glucose, Bld: 91 mg/dL (ref 70–99)
Potassium: 3.5 mEq/L — ABNORMAL LOW (ref 3.7–5.3)
Sodium: 142 mEq/L (ref 137–147)
Total Protein: 6.1 g/dL (ref 6.0–8.3)

## 2013-12-23 LAB — GLUCOSE, CAPILLARY
GLUCOSE-CAPILLARY: 88 mg/dL (ref 70–99)
Glucose-Capillary: 91 mg/dL (ref 70–99)
Glucose-Capillary: 92 mg/dL (ref 70–99)
Glucose-Capillary: 103 mg/dL — ABNORMAL HIGH (ref 70–99)
Glucose-Capillary: 88 mg/dL (ref 70–99)

## 2013-12-23 LAB — HEMOGLOBIN A1C
HEMOGLOBIN A1C: 6.6 % — AB (ref <5.7)
MEAN PLASMA GLUCOSE: 143 mg/dL — AB (ref <117)

## 2013-12-23 LAB — CBC
HCT: 38.1 % — ABNORMAL LOW (ref 39.0–52.0)
Hemoglobin: 12.8 g/dL — ABNORMAL LOW (ref 13.0–17.0)
MCH: 29.4 pg (ref 26.0–34.0)
MCHC: 33.6 g/dL (ref 30.0–36.0)
MCV: 87.4 fL (ref 78.0–100.0)
PLATELETS: 171 10*3/uL (ref 150–400)
RBC: 4.36 MIL/uL (ref 4.22–5.81)
RDW: 13.5 % (ref 11.5–15.5)
WBC: 6.7 10*3/uL (ref 4.0–10.5)

## 2013-12-23 LAB — TSH: TSH: 2.02 u[IU]/mL (ref 0.350–4.500)

## 2013-12-23 LAB — PHOSPHORUS: Phosphorus: 4.3 mg/dL (ref 2.3–4.6)

## 2013-12-23 LAB — MAGNESIUM: Magnesium: 1.1 mg/dL — ABNORMAL LOW (ref 1.5–2.5)

## 2013-12-23 MED ORDER — PHENOL 1.4 % MT LIQD
1.0000 | OROMUCOSAL | Status: DC | PRN
  Filled 2013-12-23: qty 177

## 2013-12-23 MED ORDER — MAGNESIUM SULFATE 4000MG/100ML IJ SOLN
4.0000 g | Freq: Once | INTRAMUSCULAR | Status: AC
  Administered 2013-12-23: 4 g via INTRAVENOUS
  Filled 2013-12-23: qty 100

## 2013-12-23 MED ORDER — ALBUTEROL SULFATE (2.5 MG/3ML) 0.083% IN NEBU: 2.5000 mg | INHALATION_SOLUTION | RESPIRATORY_TRACT | Status: DC | PRN

## 2013-12-23 MED ORDER — HYDROMORPHONE HCL PF 2 MG/ML IJ SOLN
2.0000 mg | INTRAMUSCULAR | Status: DC | PRN
  Administered 2013-12-23 – 2013-12-25 (×6): 2 mg via INTRAVENOUS
  Filled 2013-12-23 (×6): qty 1

## 2013-12-23 MED ORDER — ENOXAPARIN SODIUM 40 MG/0.4ML ~~LOC~~ SOLN: 40.0000 mg | Freq: Every day | SUBCUTANEOUS | Status: DC

## 2013-12-23 MED ORDER — POTASSIUM CHLORIDE 10 MEQ/100ML IV SOLN
10.0000 meq | INTRAVENOUS | Status: AC
  Administered 2013-12-23 (×4): 10 meq via INTRAVENOUS
  Filled 2013-12-23 (×4): qty 100

## 2013-12-23 MED ORDER — SODIUM CHLORIDE 0.9 % IV SOLN
INTRAVENOUS | Status: DC
  Administered 2013-12-23: 1000 mL via INTRAVENOUS
  Administered 2013-12-24 (×2): via INTRAVENOUS

## 2013-12-23 NOTE — Consult Note (Addendum)
General surgery attending:  I have interviewed and examined this patient today. I have reviewed his past and current imaging studies as well as his clinical history of his abdominal carcinomatosis. I have discussed the surgical issues and treatment proposals with the patient, his wife, Dr. Jackolyn Confer, and Dr. Julieanne Manson.  He has progressive abdominal  carcinomatosis and has a significant abdominal tumor burden. His abdominal exam reveals palpable tumor in the midline of the abdominal wall under the incision. CT scan shows progressive intra-abdominal and abdominal wall tumor deposits. He presents with intermittent partial small bowel obstruction, almost certainly due to his carcinomatosis.  He probably has multiple points of partial obstruction.  He does pass gas and have stools. Today he is feeling much better since the NG tube is in place. Good pain control. Alert and cooperative.  I do not think that it is technically feasible nor clinically appropriate to recommend laparotomy for his malignant SBO. I actually do not believe that we would be able to enter his abdomen due to the tumor burden under the incision.       I do not believe that he would benefit from surgical intervention, and I think his existing quality of life would suffer significantly if this were attempted.  I discussed this with him and his wife and with Dr. Benay Spice. For now I think we should continue NG suction and repeat abdominal films tomorrow. Consideration should be given to palliative gastrostomy tube.  This could be done  percutaneously either by radiology or GI. This would allow him to get the NG tube out and  be able to vent his GI tract in the future when he developed intermittent obstruction.  Consideration needs to be given to goals of care conversation with the patient and consideration needs to be given to palliative care consult if the patient and his wife agree.  Will follow.   Frank Baird. Dalbert Batman, M.D.,  [facility] General and Minimally invasive Surgery Breast and Colorectal Surgery

## 2013-12-23 NOTE — Progress Notes (Signed)
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 Benay Spice receiving FOLFIRI. He was admitted to [facility] following a recent hospitalization for bowel obstruction which was treated conservately. He was discharged on solid diet at the time. On 6/22 he presented with 2 day history of right upper quadrant  abdominal pain, belching, nausea without emesis and diarrhea. Despite changing diet to liquid, symptoms did not improve. New XR showed worsening bowel obstruction. Today, after NGT placement, his distention is improved, but intermittent nausea continues. No further diarrhea. He is reporting diffuse abdominal pain, worse on the left. His appetite is decreased.  Oncological History:  1. 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 CT of the chest 11/18/2012 with borderline enlarged mediastinal nodes, gastrohepatic ligament nodes and omental/peritoneal nodularity. Negative upper endoscopy. Normal CEA on 12/13/2012. Status post cycle 1 FOLFOX 12/11/2012. Status post cycle 2 FOLFOX 12/25/2012. Status post cycle 3 on 01/15/2013. Cycle 4 on 01/29/2013; cycle 5 on 02/12/2013; cycle 6 on 02/26/2013. Restaging CT evaluation showed improvement. He completed cycle 8 on 03/26/2013. Oxaliplatin was discontinued following cycle 8 due to progressive neuropathy symptoms. He began maintenance Xeloda 04/24/2013. Xeloda dose reduced beginning with cycle 5 on 07/26/2013.  CA 19-9 increased at 297.4 on 09/01/2013  CTs 09/01/2013 with decreased chest and abdomen lymphadenopathy, increased omental nodularity  Cycle 1 of FOLFIRI 09/11/2013.  Cycle 2 FOLFIRI 10/09/2013.  Cycle 3 FOLFIRI 10/23/2013  Cycle 4 FOLFIRI 11/25/2013 2. Cecal/appendix mass noted at the time of exploratory  laparotomy 11/13/2012. 3. COPD. 4. Pain secondary to carcinomatosis. 5. Chronic back pain. Stable.  6. Status post Port-A-Cath placement 12/02/2012. 7. Delayed nausea following cycle 1 FOLFOX. Aloxi was added with cycle 2. He had delayed nausea following cycle 2. Emend was added beginning with cycle 3. Prophylactic Decadron added beginning with cycle 8. He did not take as prescribed. He again had delayed nausea. 8. Diarrhea following cycle 2 FOLFOX. Question related to 5-fluorouracil. The diarrhea improved with Lomotil. The 5-fluorouracil bolus was eliminated and the dose of the 5 fluorouracil infusion was reduced beginning with cycle 3.  9. Left mid back pain/tenderness just lateral to the spine. Question etiology. Duragesic patch increased to 75 mcg every 3 days. He takes oxycodone as needed. He continues to have back pain. 10. Anorexia/weight loss. He began a trial of Megace following office visit 01/08/2013. Appetite became better at the time. No longer taking Megace.  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 Increased with moderate loss of vibratory sense at the fingertips per tuning fork exam and interfering with activity when here 04/09/2013. Oxaliplatin was discontinued. Improved. 12. Nausea/vomiting with cycle 1 Xeloda. He has persistent nausea while on Xeloda. Improved with dose reduction beginning with cycle 5. 13. Partial small bowel obstruction confirmed on plain x-ray 12/10/2013 and CT 12/11/2013 after presenting with frequent belching and abdominal bloating   Scheduled Meds: . enoxaparin (LOVENOX) injection  40 mg Subcutaneous QHS  . fentaNYL  75 mcg Transdermal Q72H  . insulin aspart  0-9 Units Subcutaneous 6 times per day  . nicotine  21 mg Transdermal Daily  . pantoprazole (PROTONIX) IV  40 mg Intravenous QHS   Continuous Infusions:  PRN Meds:.acetaminophen, acetaminophen, albuterol,  HYDROmorphone (DILAUDID) injection, LORazepam, ondansetron (ZOFRAN) IV   Objective: Vital signs in last 24  hours: Blood pressure 147/74, pulse 57, temperature 98.6 F (37 C), temperature source Oral, resp. rate 16, height 5\' 10"  (1.778 m), weight 174 lb 6.4 oz (79.107 kg), SpO2 91.00%.  Intake/Output from previous day: 06/22 0701 - 06/23 0700 In: 50 [NG/GT:50] Out: -   Physical Exam: General: anxious. Alert and oriented.  Lungs: Clear bilaterally Cardiac: Regular rate and rhythm Abdomen: Tender in the bilateral abdomen,worse on the left  no discrete mass, no hepatomegaly, active bowel sounds, NGT in place without bloody secretions.  Vascular: No leg edema Portacath/PICC-without erythema  Lab Results:  Recent Labs  12/22/13 1542 12/23/13 0555  WBC 10.3 6.7  HGB 14.4 12.8*  HCT 42.9 38.1*  PLT 237 171    BMET  Recent Labs  12/22/13 1542 12/23/13 0555  NA 137 142  K 4.1 3.5*  CL 98 103  CO2 24 27  GLUCOSE 117* 91  BUN 8 7  CREATININE 0.90 0.84  CALCIUM 9.4 8.5    Studies/Results: Dg Abd Acute W/chest  12/22/2013   CLINICAL DATA:  Abdominal pain, nausea.  EXAM: ACUTE ABDOMEN SERIES (ABDOMEN 2 VIEW & CHEST 1 VIEW)  COMPARISON:  12/12/2013.  FINDINGS: Left Port-A-Cath is in place with the tip in the SVC. Heart is normal size. Lungs are clear. No effusions.  Dilated small bowel loops with air-fluid levels throughout the abdomen, worsening since prior study. Decreasing colonic gas. No free air organomegaly. Prior cholecystectomy.  IMPRESSION: Worsening small bowel distention and air-fluid levels compatible with worsening small bowel obstruction.  No acute cardiopulmonary disease.   Electronically Signed   By: Rolm Baptise M.D.   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    3. Abdominal Pain,  secondary to #1    continue narcotic analgesics for pain   4. Anemia, multifactorial    5. Full Code  6. Malnutrition due to #1   7. Gout    8. RLE swelling,  resolved. Dopplers pending.  On Lovenox per pharmacy    LOS: 1 day   [facility]  12/23/2013, 7:22 AM Frank Baird is well-known to me with a history of abdominal carcinomatosis. He was discharged from the hospital last week after an admission with a small bowel obstruction. The obstructive symptoms have returned. He feels better after placement of the NG tube. I discussed treatment options with him. He understands there is a very small chance for further chemotherapy will help the obstruction. He also understands the chance of bypassing a single point of obstruction or resecting an area of obstruction is very low. He may be a candidate for a palliative ostomy or gastrostomy tube.  We discussed considering surgical intervention versus comfort care. He is interested in discussing the possibility of surgery with Dr. Dalbert Batman.

## 2013-12-23 NOTE — Progress Notes (Signed)
*  PRELIMINARY RESULTS* Vascular Ultrasound Right lower extremity venous duplex has been completed.  Preliminary findings: no evidence of DVT.   Landry Mellow, RDMS, RVT  12/23/2013, 11:14 AM

## 2013-12-23 NOTE — Progress Notes (Signed)
Patient ID: Frank Baird, male   DOB: 1960-07-29, 53 y.o.   MRN: [id number] [facility] PROGRESS NOTE  Frank Baird BLT:[id number] DOB: [birth year] DOA: 12/22/2013 PCP: Odette Fraction, MD  Brief narrative: 53 y.o. 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 Benay Spice receiving FOLFIRI. Had recent admition for SBO that was treated conservatively. He had a surgical consult done at that time and it was thought that SBO was due to progressive carcinomatosis with multiple points of intestinal involvement. No operative intervention was recommended at that time. Patient presented this time with 3 day hx of abdominal pain, nausea, and watery BM. He stated although he felt better initially after discharge, the belching started back soon there after. Patient recognized symtoms of SBO and attempted to decrease PO intake but continued to have symptoms. He presented to [facility] ER and was found to have SBO. NGT was placed and surgical consult was called but recommend further discussion with oncology given heavy tumor burden.   Assessment/Plan: Active Problems: SBO - continue conservative management, NGT for decompression and repeat abdominal films in the AM - provide analgesia and antiemetics as needed - appreciate surgery and oncology input  Abdominal carcinomatosis  - with multiple biopsies on 11/13/2012 confirming metastatic adenocarcinoma, immunophenotype most consistent with an upper gastrointestinal or pancreaticobiliary primary - offered emotional support and pt says he appreciates our time and care  COPD - clinically compensated at this time - oxygen saturations at target range - provide nicotine patch  Hypokalemia - in the setting of hypomagnesemia - will supplement both electrolytes and repeat BMP, Mg in AM Pain secondary to carcinomatosis. - analgesia as needed  Severe malnutrition - secondary to progressive carcinomatosis - pt lost 7 lbs  since last admission - NPO for now   DVT prophylaxis: Lovenox   Code Status: full code  Family Communication: plan of care discussed with the patient Disposition Plan: home when stable   Leisa Lenz, MD  [facility] Pager [phone]  If 7PM-7AM, please contact night-coverage [email or web address] Password [email or web address] 12/23/2013, 9:30 AM   LOS: 1 day   Consultants:  Oncology  Surgery  Procedures:  None  Antibiotics:  None  HPI/Subjective: Pt reports feeling somewhat better but also feels depressed about prognosis and progression of cancer.   Objective: Filed Vitals:   12/22/13 1956 12/22/13 1957 12/22/13 2300 12/23/13 0355  BP: 126/71 126/71 148/77 147/74  Pulse: 51 51 58 57  Temp:   98.2 F (36.8 C) 98.6 F (37 C)  TempSrc:   Oral Oral  Resp: 14  16 16   Height:   5\' 10"  (1.778 m)   Weight:   79.107 kg (174 lb 6.4 oz)   SpO2: 95% 94% 93% 91%    Intake/Output Summary (Last 24 hours) at 12/23/13 0930 Last data filed at 12/23/13 0737  Gross per 24 hour  Intake     50 ml  Output    780 ml  Net   -730 ml    Exam:   General:  Pt is alert, follows commands appropriately, not in acute distress  Cardiovascular: Regular rate and rhythm, S1/S2, no murmurs  Respiratory: Clear to auscultation bilaterally, no wheezing, no crackles, no rhonchi  Abdomen: Soft, tender, distended, bowel sounds present  Extremities: No edema, pulses DP and PT palpable bilaterally  Neuro: Grossly nonfocal  Data Reviewed: Basic Metabolic Panel:  Recent Labs Lab 12/22/13 1542 12/23/13 0555  NA 137 142  K 4.1 3.5*  CL 98 103  CO2  24 27  GLUCOSE 117* 91  BUN 8 7  CREATININE 0.90 0.84  CALCIUM 9.4 8.5  MG  --  1.1*  PHOS  --  4.3   Liver Function Tests:  Recent Labs Lab 12/22/13 1542 12/23/13 0555  AST 40* 31  ALT 36 31  ALKPHOS 96 84  BILITOT 0.6 0.8  PROT 7.3 6.1  ALBUMIN 3.8 3.2*    Recent Labs Lab 12/22/13 1542  LIPASE 18    CBC:  Recent Labs Lab  12/22/13 1542 12/23/13 0555  WBC 10.3 6.7  NEUTROABS 7.1  --   HGB 14.4 12.8*  HCT 42.9 38.1*  MCV 87.4 87.4  PLT 237 171   CBG:  Recent Labs Lab 12/22/13 2346 12/23/13 0352 12/23/13 0811  GLUCAP 101* 91 92    Recent Results (from the past 240 hour(s))  CLOSTRIDIUM DIFFICILE BY PCR     Status: None   Collection Time    12/14/13  2:21 PM      Result Value Ref Range Status   C difficile by pcr NEGATIVE  NEGATIVE Final   Comment: Performed at [place]     Status: None   Collection Time    12/14/13  2:21 PM      Result Value Ref Range Status   Specimen Description STOOL   Final   Special Requests NONE   Final   Culture     Final   Value: NO SALMONELLA, SHIGELLA, CAMPYLOBACTER, YERSINIA, OR E.COLI 0157:H7 ISOLATED     Performed at [facility]   Report Status 12/18/2013 FINAL   Final     Studies: Dg Abd Acute W/chest  12/22/2013   CLINICAL DATA:  Abdominal pain, nausea.  EXAM: ACUTE ABDOMEN SERIES (ABDOMEN 2 VIEW & CHEST 1 VIEW)  COMPARISON:  12/12/2013.  FINDINGS: Left Port-A-Cath is in place with the tip in the SVC. Heart is normal size. Lungs are clear. No effusions.  Dilated small bowel loops with air-fluid levels throughout the abdomen, worsening since prior study. Decreasing colonic gas. No free air organomegaly. Prior cholecystectomy.  IMPRESSION: Worsening small bowel distention and air-fluid levels compatible with worsening small bowel obstruction.  No acute cardiopulmonary disease.   Electronically Signed   By: Rolm Baptise M.D.   On: 12/22/2013 16:04    Scheduled Meds: . enoxaparin (LOVENOX) injection  40 mg Subcutaneous QHS  . fentaNYL  75 mcg Transdermal Q72H  . insulin aspart  0-9 Units Subcutaneous 6 times per day  . nicotine  21 mg Transdermal Daily  . pantoprazole (PROTONIX) IV  40 mg Intravenous QHS   Continuous Infusions:

## 2013-12-23 NOTE — Consult Note (Signed)
Reason for Consult: SBO, carcinomatosis  Referring Physician: Noland Fordyce, PA   HPI: Frank Baird is a 53 year old male with a history of metastatic adenocarcinoma, abdominal carcinomatosis causing recurrent SBOs.  He was admitted 12/11/13-12/15/13 and has been readmitted with recurrent symptoms.  The patient states he felt fairly well the first two days following his discharge. He was eating solids and then developed diarrhea.  He then back down to liquids and developed belching, abdominal pain.  Overtime, the pain progressively worsened.  He did not have any relief with his home pain medication regimen.  He denies fever, chills or sweats.  C diff dated 6/14 was negative.  He continues to have diarrhea.  He is passing flatus.  An NGT has been placed, 568ml of output overnight and 275ml today.  Labs are stable.  A CT of abdomen and pelvis on 12/12/13 revealed carcinomatosis, partial small bowel obstruction.  This admission, AXR shows worsening small bowel distention, SBO.  We have been asked to evaluate the patient for his recurrent symptoms.    Past Medical History  Diagnosis Date  . RUQ pain   . Complication of anesthesia     difficulty waking up  . Arthritis   . GERD (gastroesophageal reflux disease)   . Diabetes mellitus   . Hyperlipidemia   . Hypertension   . History of gout   . COPD (chronic obstructive pulmonary disease)   . Anxiety   . Depression   . Cancer     colon ca    Past Surgical History  Procedure Laterality Date  . Shoulder surgery  2012    right shoulder rotator cuff  . Nasal sinus surgery  2000  . Hernia repair      umbilical and right inguinal  . Elbow surgery      right elbow ligament  . Eye surgery      bilateral cataract surgery  . Cholecystectomy  09/26/2011    Procedure: LAPAROSCOPIC CHOLECYSTECTOMY WITH INTRAOPERATIVE CHOLANGIOGRAM;  Surgeon: Edward Jolly, MD;  Location: [facility];  Service: General;  Laterality: N/A;  . Laparoscopic appendectomy N/A  11/12/2012    Procedure: DIAGNOSTIC LAPAROSCOPY, OPEN EXPLORATORY LAPAROTOMY, INCISIONAL BIOPSIES OF CALICFORM LIGAMENT, MESSENTERY, UMBILICAL LIGAMENT, BAND OF DUODENUM;  Surgeon: Madilyn Hook, DO;  Location: [facility];  Service: General;  Laterality: N/A;  . Portacath placement N/A 12/02/2012    Procedure: INSERTION PORT-A-CATH;  Surgeon: Madilyn Hook, DO;  Location: [facility];  Service: General;  Laterality: N/A;  with xray and ultrasound     Family History  Problem Relation Age of Onset  . Heart disease Mother   . Diabetes type II Mother   . Prostate cancer Father     Social History:  reports that he has been smoking.  He has never used smokeless tobacco. He reports that he drinks alcohol. He reports that he does not use illicit drugs.  Allergies:  Allergies  Allergen Reactions  . Bee Venom     Unsure of reaction, Stung as baby and almost died  . Codeine     Bad stomach cramps    . Penicillins Other (See Comments)    unknown    Medications:  Scheduled Meds: . enoxaparin (LOVENOX) injection  40 mg Subcutaneous QHS  . fentaNYL  75 mcg Transdermal Q72H  . insulin aspart  0-9 Units Subcutaneous 6 times per day  . nicotine  21 mg Transdermal Daily  . pantoprazole (PROTONIX) IV  40 mg Intravenous QHS   Continuous Infusions:  PRN Meds:.acetaminophen, acetaminophen, albuterol, HYDROmorphone (DILAUDID) injection, LORazepam, ondansetron (ZOFRAN) IV   Results for orders placed during the hospital encounter of 12/22/13 (from the past 48 hour(s))  CBC WITH DIFFERENTIAL     Status: None   Collection Time    12/22/13  3:42 PM      Result Value Ref Range   WBC 10.3  4.0 - 10.5 K/uL   RBC 4.91  4.22 - 5.81 MIL/uL   Hemoglobin 14.4  13.0 - 17.0 g/dL   HCT 42.9  39.0 - 52.0 %   MCV 87.4  78.0 - 100.0 fL   MCH 29.3  26.0 - 34.0 pg   MCHC 33.6  30.0 - 36.0 g/dL   RDW 13.7  11.5 - 15.5 %   Platelets 237  150 - 400 K/uL   Neutrophils Relative % 68  43 - 77 %   Neutro Abs 7.1  1.7 - 7.7 K/uL    Lymphocytes Relative 22  12 - 46 %   Lymphs Abs 2.2  0.7 - 4.0 K/uL   Monocytes Relative 8  3 - 12 %   Monocytes Absolute 0.8  0.1 - 1.0 K/uL   Eosinophils Relative 2  0 - 5 %   Eosinophils Absolute 0.2  0.0 - 0.7 K/uL   Basophils Relative 0  0 - 1 %   Basophils Absolute 0.0  0.0 - 0.1 K/uL  COMPREHENSIVE METABOLIC PANEL     Status: Abnormal   Collection Time    12/22/13  3:42 PM      Result Value Ref Range   Sodium 137  137 - 147 mEq/L   Potassium 4.1  3.7 - 5.3 mEq/L   Chloride 98  96 - 112 mEq/L   CO2 24  19 - 32 mEq/L   Glucose, Bld 117 (*) 70 - 99 mg/dL   BUN 8  6 - 23 mg/dL   Creatinine, Ser 0.90  0.50 - 1.35 mg/dL   Calcium 9.4  8.4 - 10.5 mg/dL   Total Protein 7.3  6.0 - 8.3 g/dL   Albumin 3.8  3.5 - 5.2 g/dL   AST 40 (*) 0 - 37 U/L   ALT 36  0 - 53 U/L   Alkaline Phosphatase 96  39 - 117 U/L   Total Bilirubin 0.6  0.3 - 1.2 mg/dL   GFR calc non Af Amer >90  >90 mL/min   GFR calc Af Amer >90  >90 mL/min   Comment: (NOTE)     The eGFR has been calculated using the CKD EPI equation.     This calculation has not been validated in all clinical situations.     eGFR's persistently <90 mL/min signify possible Chronic Kidney     Disease.  LIPASE, BLOOD     Status: None   Collection Time    12/22/13  3:42 PM      Result Value Ref Range   Lipase 18  11 - 59 U/L  URIC ACID     Status: None   Collection Time    12/22/13  3:48 PM      Result Value Ref Range   Uric Acid, Serum 6.4  4.0 - 7.8 mg/dL  HEMOGLOBIN A1C     Status: Abnormal   Collection Time    12/22/13  3:48 PM      Result Value Ref Range   Hemoglobin A1C 6.6 (*) <5.7 %   Comment: (NOTE)  According to the ADA Clinical Practice Recommendations for 2011, when     HbA1c is used as a screening test:      >=6.5%   Diagnostic of Diabetes Mellitus               (if abnormal result is confirmed)     5.7-6.4%   Increased risk of  developing Diabetes Mellitus     References:Diagnosis and Classification of Diabetes Mellitus,Diabetes     JASN,0539,76(BHALP 1):S62-S69 and Standards of Medical Care in             Diabetes - 2011,Diabetes FXTK,2409,73 (Suppl 1):S11-S61.   Mean Plasma Glucose 143 (*) <117 mg/dL   Comment: Performed at [facility] MICROSCOPIC     Status: Abnormal   Collection Time    12/22/13 10:48 PM      Result Value Ref Range   Color, Urine AMBER (*) YELLOW   Comment: BIOCHEMICALS MAY BE AFFECTED BY COLOR   APPearance CLEAR  CLEAR   Specific Gravity, Urine 1.022  1.005 - 1.030   pH 5.0  5.0 - 8.0   Glucose, UA NEGATIVE  NEGATIVE mg/dL   Hgb urine dipstick NEGATIVE  NEGATIVE   Bilirubin Urine SMALL (*) NEGATIVE   Ketones, ur NEGATIVE  NEGATIVE mg/dL   Protein, ur NEGATIVE  NEGATIVE mg/dL   Urobilinogen, UA 1.0  0.0 - 1.0 mg/dL   Nitrite NEGATIVE  NEGATIVE   Leukocytes, UA NEGATIVE  NEGATIVE   Comment: MICROSCOPIC NOT DONE ON URINES WITH NEGATIVE PROTEIN, BLOOD, LEUKOCYTES, NITRITE, OR GLUCOSE <1000 mg/dL.  GLUCOSE, CAPILLARY     Status: Abnormal   Collection Time    12/22/13 11:46 PM      Result Value Ref Range   Glucose-Capillary 101 (*) 70 - 99 mg/dL   Comment 1 Notify RN    GLUCOSE, CAPILLARY     Status: None   Collection Time    12/23/13  3:52 AM      Result Value Ref Range   Glucose-Capillary 91  70 - 99 mg/dL   Comment 1 Notify RN    MAGNESIUM     Status: Abnormal   Collection Time    12/23/13  5:55 AM      Result Value Ref Range   Magnesium 1.1 (*) 1.5 - 2.5 mg/dL  PHOSPHORUS     Status: None   Collection Time    12/23/13  5:55 AM      Result Value Ref Range   Phosphorus 4.3  2.3 - 4.6 mg/dL  TSH     Status: None   Collection Time    12/23/13  5:55 AM      Result Value Ref Range   TSH 2.020  0.350 - 4.500 uIU/mL   Comment: Performed at [facility] PANEL     Status: Abnormal   Collection Time     12/23/13  5:55 AM      Result Value Ref Range   Sodium 142  137 - 147 mEq/L   Potassium 3.5 (*) 3.7 - 5.3 mEq/L   Chloride 103  96 - 112 mEq/L   CO2 27  19 - 32 mEq/L   Glucose, Bld 91  70 - 99 mg/dL   BUN 7  6 - 23 mg/dL   Creatinine, Ser 0.84  0.50 - 1.35 mg/dL   Calcium 8.5  8.4 - 10.5 mg/dL   Total Protein 6.1  6.0 - 8.3 g/dL  Albumin 3.2 (*) 3.5 - 5.2 g/dL   AST 31  0 - 37 U/L   ALT 31  0 - 53 U/L   Alkaline Phosphatase 84  39 - 117 U/L   Total Bilirubin 0.8  0.3 - 1.2 mg/dL   GFR calc non Af Amer >90  >90 mL/min   GFR calc Af Amer >90  >90 mL/min   Comment: (NOTE)     The eGFR has been calculated using the CKD EPI equation.     This calculation has not been validated in all clinical situations.     eGFR's persistently <90 mL/min signify possible Chronic Kidney     Disease.  CBC     Status: Abnormal   Collection Time    12/23/13  5:55 AM      Result Value Ref Range   WBC 6.7  4.0 - 10.5 K/uL   RBC 4.36  4.22 - 5.81 MIL/uL   Hemoglobin 12.8 (*) 13.0 - 17.0 g/dL   Comment: SPECIMEN CHECKED FOR CLOTS     DELTA CHECK NOTED   HCT 38.1 (*) 39.0 - 52.0 %   MCV 87.4  78.0 - 100.0 fL   MCH 29.4  26.0 - 34.0 pg   MCHC 33.6  30.0 - 36.0 g/dL   RDW 13.5  11.5 - 15.5 %   Platelets 171  150 - 400 K/uL  GLUCOSE, CAPILLARY     Status: None   Collection Time    12/23/13  8:11 AM      Result Value Ref Range   Glucose-Capillary 92  70 - 99 mg/dL   Comment 1 Notify RN      Dg Abd Acute W/chest  12/22/2013   CLINICAL DATA:  Abdominal pain, nausea.  EXAM: ACUTE ABDOMEN SERIES (ABDOMEN 2 VIEW & CHEST 1 VIEW)  COMPARISON:  12/12/2013.  FINDINGS: Left Port-A-Cath is in place with the tip in the SVC. Heart is normal size. Lungs are clear. No effusions.  Dilated small bowel loops with air-fluid levels throughout the abdomen, worsening since prior study. Decreasing colonic gas. No free air organomegaly. Prior cholecystectomy.  IMPRESSION: Worsening small bowel distention and air-fluid levels  compatible with worsening small bowel obstruction.  No acute cardiopulmonary disease.   Electronically Signed   By: Rolm Baptise M.D.   On: 12/22/2013 16:04    Review of Systems  Constitutional: Positive for weight loss and malaise/fatigue. Negative for fever, chills and diaphoresis.  Respiratory: Negative for shortness of breath and wheezing.   Cardiovascular: Negative for chest pain and palpitations.  Gastrointestinal: Positive for abdominal pain and diarrhea. Negative for nausea and vomiting.  Neurological: Negative for weakness.   Blood pressure 147/74, pulse 57, temperature 98.6 F (37 C), temperature source Oral, resp. rate 16, height $RemoveBe'5\' 10"'UNVCImqJO$  (1.778 m), weight 174 lb 6.4 oz (79.107 kg), SpO2 91.00%. Physical Exam  Constitutional: He is oriented to person, place, and time. He appears well-developed and well-nourished. No distress.  Cardiovascular: Normal rate, regular rhythm, normal heart sounds and intact distal pulses.  Exam reveals no gallop and no friction rub.   No murmur heard. Respiratory: Effort normal and breath sounds normal. No respiratory distress. He has no wheezes. He has no rales. He exhibits no tenderness.  GI: Soft. Bowel sounds are normal. He exhibits no distension and no mass. There is no rebound and no guarding.  TTP upper abdomen without guarding or evidence of peritonitis.   Musculoskeletal: He exhibits no edema.  Neurological: He is alert and  oriented to person, place, and time.  Skin: Skin is warm and dry. No rash noted. He is not diaphoretic. No erythema. No pallor.  Psychiatric: He has a normal mood and affect. His behavior is normal. Judgment and thought content normal.    Assessment/Plan: Carcinomatosis Recurrent small bowel obstruction Agree with NGT decompression, changed to LWIS, bowel rest and repeat films in AM.  Given carcinomatosis it is unclear whether he would benefit from surgical intervention.  The patient stated to me that he does not believe he  will "make it out of the hospital alive" if he has surgery.  A venting G tube and moving towards palliative care may be reasonable at this point.  Appreciate Dr. Benay Spice discussing this with the patient this morning.  Dr. Dalbert Batman to evaluate the patient this afternoon and make final recommendations.  Thank you for the consult.   Erby Pian ANP-BC Pager [phone] 12/23/2013, 8:50 AM

## 2013-12-23 NOTE — Progress Notes (Signed)
IR PA aware of request for image guided venting gastrostomy tube placement. Previous imaging reviewed by Dr. Laurence Ferrari and approved for percutaneous G-tube. Lovenox will be held this evening and tomorrow and IR PA will see patient 6/24 for G-tube placement.  Tsosie Billing PA-C Interventional Radiology  12/23/13  4:32 PM

## 2013-12-23 NOTE — Progress Notes (Signed)
Pt was lying in bed watching tv when I arrived. I introduced myself and offered care; he said he was all right. After visit, nurse informed me he was tearful as she was placing iv. Page [phone] if additional support is needed. Ernest Haber Chaplain  12/23/13 1300  Clinical Encounter Type  Visited With Patient

## 2013-12-24 ENCOUNTER — Encounter (HOSPITAL_COMMUNITY): Payer: Self-pay | Admitting: Radiology

## 2013-12-24 ENCOUNTER — Other Ambulatory Visit: Payer: Federal, State, Local not specified - PPO

## 2013-12-24 ENCOUNTER — Inpatient Hospital Stay (HOSPITAL_COMMUNITY): Payer: Federal, State, Local not specified - PPO

## 2013-12-24 ENCOUNTER — Inpatient Hospital Stay: Payer: Federal, State, Local not specified - PPO

## 2013-12-24 ENCOUNTER — Ambulatory Visit: Payer: Federal, State, Local not specified - PPO | Admitting: Nurse Practitioner

## 2013-12-24 ENCOUNTER — Encounter: Payer: Federal, State, Local not specified - PPO | Admitting: Nutrition

## 2013-12-24 LAB — CBC
HCT: 37.6 % — ABNORMAL LOW (ref 39.0–52.0)
HEMOGLOBIN: 12.9 g/dL — AB (ref 13.0–17.0)
MCH: 29.9 pg (ref 26.0–34.0)
MCHC: 34.3 g/dL (ref 30.0–36.0)
MCV: 87 fL (ref 78.0–100.0)
Platelets: 187 10*3/uL (ref 150–400)
RBC: 4.32 MIL/uL (ref 4.22–5.81)
RDW: 13.3 % (ref 11.5–15.5)
WBC: 8.8 10*3/uL (ref 4.0–10.5)

## 2013-12-24 LAB — BASIC METABOLIC PANEL
BUN: 8 mg/dL (ref 6–23)
CHLORIDE: 101 meq/L (ref 96–112)
CO2: 25 mEq/L (ref 19–32)
Calcium: 8.9 mg/dL (ref 8.4–10.5)
Creatinine, Ser: 0.78 mg/dL (ref 0.50–1.35)
GFR calc Af Amer: >90 mL/min (ref >90)
GLUCOSE: 76 mg/dL (ref 70–99)
POTASSIUM: 3.2 meq/L — AB (ref 3.7–5.3)
Sodium: 143 mEq/L (ref 137–147)

## 2013-12-24 LAB — GLUCOSE, CAPILLARY
GLUCOSE-CAPILLARY: 75 mg/dL (ref 70–99)
GLUCOSE-CAPILLARY: 85 mg/dL (ref 70–99)
Glucose-Capillary: 77 mg/dL (ref 70–99)
Glucose-Capillary: 74 mg/dL (ref 70–99)
Glucose-Capillary: 79 mg/dL (ref 70–99)
Glucose-Capillary: 70 mg/dL (ref 70–99)

## 2013-12-24 LAB — PROTIME-INR
INR: 1.1 (ref 0.00–1.49)
Prothrombin Time: 14 seconds (ref 11.6–15.2)

## 2013-12-24 LAB — MAGNESIUM: Magnesium: 1.9 mg/dL (ref 1.5–2.5)

## 2013-12-24 MED ORDER — VANCOMYCIN HCL IN DEXTROSE 1-5 GM/200ML-% IV SOLN
1000.0000 mg | Freq: Once | INTRAVENOUS | Status: DC
  Filled 2013-12-24: qty 200

## 2013-12-24 MED ORDER — OXYCODONE HCL 20 MG/ML PO CONC
10.0000 mg | ORAL | Status: DC | PRN
  Administered 2013-12-25 – 2013-12-30 (×4): 20 mg via SUBLINGUAL
  Filled 2013-12-24 (×5): qty 1

## 2013-12-24 MED ORDER — POTASSIUM CHLORIDE CRYS ER 20 MEQ PO TBCR
30.0000 meq | EXTENDED_RELEASE_TABLET | Freq: Once | ORAL | Status: DC
  Filled 2013-12-24: qty 1

## 2013-12-24 MED ORDER — POTASSIUM CHLORIDE 10 MEQ/100ML IV SOLN
10.0000 meq | INTRAVENOUS | Status: AC
  Administered 2013-12-24 (×4): 10 meq via INTRAVENOUS
  Filled 2013-12-24 (×4): qty 100

## 2013-12-24 MED ORDER — VANCOMYCIN HCL IN DEXTROSE 1-5 GM/200ML-% IV SOLN
1000.0000 mg | Freq: Once | INTRAVENOUS | Status: AC
  Administered 2013-12-25: 1000 mg via INTRAVENOUS
  Filled 2013-12-24: qty 200

## 2013-12-24 NOTE — Progress Notes (Signed)
INPATIENT PROGRESS NOTE  Subjective:  Mr. Swor is passing flatus. The nausea is better with the NG in place. He is taking ice chips and Popsicles.  Oncological History:  1. 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 CT of the chest 11/18/2012 with borderline enlarged mediastinal nodes, gastrohepatic ligament nodes and omental/peritoneal nodularity. Negative upper endoscopy. Normal CEA on 12/13/2012. Status post cycle 1 FOLFOX 12/11/2012. Status post cycle 2 FOLFOX 12/25/2012. Status post cycle 3 on 01/15/2013. Cycle 4 on 01/29/2013; cycle 5 on 02/12/2013; cycle 6 on 02/26/2013. Restaging CT evaluation showed improvement. He completed cycle 8 on 03/26/2013. Oxaliplatin was discontinued following cycle 8 due to progressive neuropathy symptoms. He began maintenance Xeloda 04/24/2013. Xeloda dose reduced beginning with cycle 5 on 07/26/2013.  CA 19-9 increased at 297.4 on 09/01/2013  CTs 09/01/2013 with decreased chest and abdomen lymphadenopathy, increased omental nodularity  Cycle 1 of FOLFIRI 09/11/2013.  Cycle 2 FOLFIRI 10/09/2013.  Cycle 3 FOLFIRI 10/23/2013  Cycle 4 FOLFIRI 11/25/2013 2. Cecal/appendix mass noted at the time of exploratory laparotomy 11/13/2012. 3. COPD. 4. Pain secondary to carcinomatosis. 5. Chronic back pain. Stable.  6. Status post Port-A-Cath placement 12/02/2012. 7. Delayed nausea following cycle 1 FOLFOX. Aloxi was added with cycle 2. He had delayed nausea following cycle 2. Emend was added beginning with cycle 3. Prophylactic Decadron added beginning with cycle 8. He did not take as prescribed. He again had delayed nausea. 8. Diarrhea following cycle 2 FOLFOX. Question related to 5-fluorouracil. The diarrhea improved with Lomotil. The 5-fluorouracil bolus was eliminated and the dose of the 5 fluorouracil infusion was  reduced beginning with cycle 3.  9. Left mid back pain/tenderness just lateral to the spine. Question etiology. Duragesic patch increased to 75 mcg every 3 days. He takes oxycodone as needed. He continues to have back pain. 10. Anorexia/weight loss. He began a trial of Megace following office visit 01/08/2013. Appetite became better at the time. No longer taking Megace.  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 Increased with moderate loss of vibratory sense at the fingertips per tuning fork exam and interfering with activity when here 04/09/2013. Oxaliplatin was discontinued. Improved. 12. Nausea/vomiting with cycle 1 Xeloda. He has persistent nausea while on Xeloda. Improved with dose reduction beginning with cycle 5. 13. Partial small bowel obstruction confirmed on plain x-ray 12/10/2013 and CT 12/11/2013 after presenting with frequent belching and abdominal bloating   Scheduled Meds: . fentaNYL  75 mcg Transdermal Q72H  . insulin aspart  0-9 Units Subcutaneous 6 times per day  . nicotine  21 mg Transdermal Daily  . pantoprazole (PROTONIX) IV  40 mg Intravenous QHS  . potassium chloride  10 mEq Intravenous Q1 Hr x 4  . [START ON 12/25/2013] vancomycin  1,000 mg Intravenous Once   Continuous Infusions: . sodium chloride 1,000 mL (12/23/13 2218)   PRN Meds:.acetaminophen, acetaminophen, albuterol, HYDROmorphone (DILAUDID) injection, LORazepam, ondansetron (ZOFRAN) IV, phenol   Objective: Vital signs in last 24 hours: Blood pressure 148/68, pulse 60, temperature 98.5 F (36.9 C), temperature source Oral, resp. rate 20, height $RemoveBe'5\' 10"'hsaVjUgJr$  (1.778 m), weight 174 lb (78.926 kg), SpO2 94.00%.  Intake/Output from previous day: 06/23 0701 - 06/24 0700 In: 3547 [P.O.:380; I.V.:3127; NG/GT:40] Out: 80 [Urine:900; Emesis/NG output:3050]  Physical Exam: Abdomen-soft, palpable fullness in the inferior to  the umbilicus  Lab Results:  Recent Labs  12/23/13 0555 12/24/13 0410  WBC 6.7 8.8  HGB 12.8* 12.9*  HCT 38.1* 37.6*  PLT 171 187    BMET  Recent Labs  12/23/13 0555 12/24/13 0410  NA 142 143  K 3.5* 3.2*  CL 103 101  CO2 27 25  GLUCOSE 91 76  BUN 7 8  CREATININE 0.84 0.78  CALCIUM 8.5 8.9    Studies/Results: Dg Abd Acute W/chest  12/22/2013   CLINICAL DATA:  Abdominal pain, nausea.  EXAM: ACUTE ABDOMEN SERIES (ABDOMEN 2 VIEW & CHEST 1 VIEW)  COMPARISON:  12/12/2013.  FINDINGS: Left Port-A-Cath is in place with the tip in the SVC. Heart is normal size. Lungs are clear. No effusions.  Dilated small bowel loops with air-fluid levels throughout the abdomen, worsening since prior study. Decreasing colonic gas. No free air organomegaly. Prior cholecystectomy.  IMPRESSION: Worsening small bowel distention and air-fluid levels compatible with worsening small bowel obstruction.  No acute cardiopulmonary disease.   Electronically Signed   By: Rolm Baptise M.D.   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    3. Abdominal Pain, secondary to #1    continue narcotic analgesics for pain   4. Anemia, multifactorial    5. Full Code  6. Malnutrition due to #1   7. Gout       LOS: 2 days   Betsy Coder  12/24/2013, 10:43 AM Mr. Eimers appears stable. I discussed the case with Dr. Dalbert Batman yesterday. I met with Mr. Kakos and his wife yesterday evening to discuss palliative treatment options. I recommend placement of a palliative gastrostomy tube. The goal of the gastrostomy tube placement is to decrease the chance of recurrent nausea/vomiting and alleviate the need for an NG tube in the future.  He is considering the option of a palliative G-tube placement. He can try a liquid diet once the G-tube  is in place. He plans to travel over the July 4 weekend. I do not recommend further systemic therapy. I will discuss Hospice care with Mr. Riker on 12/25/2013.  Recommendations: 1. Placement of palliative gastrostomy tube if Mr. Miskell is in agreement 2. Continue the Duragesic patch and add sublingual oxycodone for pain

## 2013-12-24 NOTE — Progress Notes (Signed)
IR PA has seen patient and discussed the risks and benefits of a gastrostomy tube placement. At this time he would like to think about all that we have talked about regarding the procedure and possibly plan on doing the procedure on 6/25 as the physician working tomorrow does not require a barium enema for the procedure. He refuses the procedure today secondary to the need for a barium enema during procedure.  Tsosie Billing PA-C Interventional Radiology  12/24/13  10:18 AM

## 2013-12-24 NOTE — H&P (Signed)
Chief Complaint: "Nausea/vomiting."  Referring Physician: Dr. Benay Spice HPI: Frank Baird is an 53 y.o. male with metastatic adenocarcinoma and abdominal carcinomatosis. The patient presented with N/V and found to have a partial small bowel obstruction s/p decompression with NGT. IR received request for palliative venting gastrostomy tube placement. The patient states his N/V has improved with the NGT. He denies any chest pain, shortness of breath or palpitations. He denies any active signs of bleeding or excessive bruising. He denies any recent fever or chills. The patient denies any history of sleep apnea or chronic oxygen use. He has previously tolerated sedation without complications.    Past Medical History:  Past Medical History  Diagnosis Date  . RUQ pain   . Complication of anesthesia     difficulty waking up  . Arthritis   . GERD (gastroesophageal reflux disease)   . Diabetes mellitus   . Hyperlipidemia   . Hypertension   . History of gout   . COPD (chronic obstructive pulmonary disease)   . Anxiety   . Depression   . Cancer     colon ca    Past Surgical History:  Past Surgical History  Procedure Laterality Date  . Shoulder surgery  2012    right shoulder rotator cuff  . Nasal sinus surgery  2000  . Hernia repair      umbilical and right inguinal  . Elbow surgery      right elbow ligament  . Eye surgery      bilateral cataract surgery  . Cholecystectomy  09/26/2011    Procedure: LAPAROSCOPIC CHOLECYSTECTOMY WITH INTRAOPERATIVE CHOLANGIOGRAM;  Surgeon: Edward Jolly, MD;  Location: [facility];  Service: General;  Laterality: N/A;  . Laparoscopic appendectomy N/A 11/12/2012    Procedure: DIAGNOSTIC LAPAROSCOPY, OPEN EXPLORATORY LAPAROTOMY, INCISIONAL BIOPSIES OF CALICFORM LIGAMENT, MESSENTERY, UMBILICAL LIGAMENT, BAND OF DUODENUM;  Surgeon: Madilyn Hook, DO;  Location: [facility];  Service: General;  Laterality: N/A;  . Portacath placement N/A 12/02/2012    Procedure:  INSERTION PORT-A-CATH;  Surgeon: Madilyn Hook, DO;  Location: [facility];  Service: General;  Laterality: N/A;  with xray and ultrasound     Family History:  Family History  Problem Relation Age of Onset  . Heart disease Mother   . Diabetes type II Mother   . Prostate cancer Father     Social History:  reports that he has been smoking.  He has never used smokeless tobacco. He reports that he drinks alcohol. He reports that he does not use illicit drugs.  Allergies:  Allergies  Allergen Reactions  . Bee Venom     Unsure of reaction, Stung as baby and almost died  . Codeine     Bad stomach cramps    . Penicillins Other (See Comments)    unknown    Medications:   Medication List    ASK your doctor about these medications       albuterol 108 (90 BASE) MCG/ACT inhaler  Commonly known as:  PROVENTIL HFA;VENTOLIN HFA  Inhale 2 puffs into the lungs every 6 (six) hours as needed for wheezing.     diphenoxylate-atropine 2.5-0.025 MG per tablet  Commonly known as:  LOMOTIL  Take 1 tablet by mouth 4 (four) times daily as needed for diarrhea or loose stools.     esomeprazole 40 MG capsule  Commonly known as:  NEXIUM  Take 1 capsule (40 mg total) by mouth daily before breakfast.     fentaNYL 75 MCG/HR  Commonly known  as:  [place] - dosed mcg/hr  Place 1 patch (75 mcg total) onto the skin every 3 (three) days.     HYDROmorphone 4 MG tablet  Commonly known as:  DILAUDID  Take 1 tablet (4 mg total) by mouth every 4 (four) hours as needed for moderate pain or severe pain.     lidocaine-prilocaine cream  Commonly known as:  EMLA  Apply to port a cath site one hour prior to chemo. Do not rub in. Cover with plastic.     lisinopril 20 MG tablet  Commonly known as:  PRINIVIL,ZESTRIL  Take 20 mg by mouth daily.     LORazepam 1 MG tablet  Commonly known as:  ATIVAN  Take 1 mg by mouth every 6 (six) hours as needed for anxiety or sleep.     metoCLOPramide 10 MG tablet  Commonly  known as:  REGLAN  Take 1 tablet (10 mg total) by mouth 3 (three) times daily before meals.     nicotine 21 mg/24hr patch  Commonly known as:  NICODERM CQ - dosed in mg/24 hours  Place 21 mg onto the skin daily.     ondansetron 8 MG tablet  Commonly known as:  ZOFRAN  Take 1 tablet (8 mg total) by mouth every 8 (eight) hours as needed for nausea.     Oxycodone HCl 10 MG Tabs  Take 10 mg by mouth every 4 (four) hours as needed (pain).     pioglitazone 30 MG tablet  Commonly known as:  ACTOS  Take 30 mg by mouth daily.        Please HPI for pertinent positives, otherwise complete 10 system ROS negative.  Physical Exam: BP 148/68  Pulse 60  Temp(Src) 98.5 F (36.9 C) (Oral)  Resp 20  Ht $R'5\' 10"'rx$  (1.778 m)  Wt 174 lb (78.926 kg)  BMI 24.97 kg/m2  SpO2 94% Body mass index is 24.97 kg/(m^2).  General Appearance:  Alert, cooperative, no distress  Head:  Normocephalic, without obvious abnormality, atraumatic  Neck: Supple, symmetrical, trachea midline  Lungs:   Clear to auscultation bilaterally, no w/r/r, respirations unlabored without use of accessory muscles.  Chest Wall:  No tenderness or deformity  Heart:  Regular rate and rhythm, S1, S2 normal, no murmur, rub or gallop.  Abdomen:   Soft, tender, distended, (+) BS  Extremities: Extremities normal, atraumatic, no cyanosis or edema  Neurologic: Normal affect, no gross deficits.   Results for orders placed during the hospital encounter of 12/22/13 (from the past 48 hour(s))  CBC WITH DIFFERENTIAL     Status: None   Collection Time    12/22/13  3:42 PM      Result Value Ref Range   WBC 10.3  4.0 - 10.5 K/uL   RBC 4.91  4.22 - 5.81 MIL/uL   Hemoglobin 14.4  13.0 - 17.0 g/dL   HCT 42.9  39.0 - 52.0 %   MCV 87.4  78.0 - 100.0 fL   MCH 29.3  26.0 - 34.0 pg   MCHC 33.6  30.0 - 36.0 g/dL   RDW 13.7  11.5 - 15.5 %   Platelets 237  150 - 400 K/uL   Neutrophils Relative % 68  43 - 77 %   Neutro Abs 7.1  1.7 - 7.7 K/uL    Lymphocytes Relative 22  12 - 46 %   Lymphs Abs 2.2  0.7 - 4.0 K/uL   Monocytes Relative 8  3 - 12 %   Monocytes  Absolute 0.8  0.1 - 1.0 K/uL   Eosinophils Relative 2  0 - 5 %   Eosinophils Absolute 0.2  0.0 - 0.7 K/uL   Basophils Relative 0  0 - 1 %   Basophils Absolute 0.0  0.0 - 0.1 K/uL  COMPREHENSIVE METABOLIC PANEL     Status: Abnormal   Collection Time    12/22/13  3:42 PM      Result Value Ref Range   Sodium 137  137 - 147 mEq/L   Potassium 4.1  3.7 - 5.3 mEq/L   Chloride 98  96 - 112 mEq/L   CO2 24  19 - 32 mEq/L   Glucose, Bld 117 (*) 70 - 99 mg/dL   BUN 8  6 - 23 mg/dL   Creatinine, Ser 0.90  0.50 - 1.35 mg/dL   Calcium 9.4  8.4 - 10.5 mg/dL   Total Protein 7.3  6.0 - 8.3 g/dL   Albumin 3.8  3.5 - 5.2 g/dL   AST 40 (*) 0 - 37 U/L   ALT 36  0 - 53 U/L   Alkaline Phosphatase 96  39 - 117 U/L   Total Bilirubin 0.6  0.3 - 1.2 mg/dL   GFR calc non Af Amer >90  >90 mL/min   GFR calc Af Amer >90  >90 mL/min   Comment: (NOTE)     The eGFR has been calculated using the CKD EPI equation.     This calculation has not been validated in all clinical situations.     eGFR's persistently <90 mL/min signify possible Chronic Kidney     Disease.  LIPASE, BLOOD     Status: None   Collection Time    12/22/13  3:42 PM      Result Value Ref Range   Lipase 18  11 - 59 U/L  URIC ACID     Status: None   Collection Time    12/22/13  3:48 PM      Result Value Ref Range   Uric Acid, Serum 6.4  4.0 - 7.8 mg/dL  HEMOGLOBIN A1C     Status: Abnormal   Collection Time    12/22/13  3:48 PM      Result Value Ref Range   Hemoglobin A1C 6.6 (*) <5.7 %   Comment: (NOTE)                                                                               According to the ADA Clinical Practice Recommendations for 2011, when     HbA1c is used as a screening test:      >=6.5%   Diagnostic of Diabetes Mellitus               (if abnormal result is confirmed)     5.7-6.4%   Increased risk of developing  Diabetes Mellitus     References:Diagnosis and Classification of Diabetes Mellitus,Diabetes     HMCN,4709,62(EZMOQ 1):S62-S69 and Standards of Medical Care in             Diabetes - 2011,Diabetes Care,2011,34 (Suppl 1):S11-S61.   Mean Plasma Glucose 143 (*) <117 mg/dL   Comment: Performed at [facility],  ROUTINE W REFLEX MICROSCOPIC     Status: Abnormal   Collection Time    12/22/13 10:48 PM      Result Value Ref Range   Color, Urine AMBER (*) YELLOW   Comment: BIOCHEMICALS MAY BE AFFECTED BY COLOR   APPearance CLEAR  CLEAR   Specific Gravity, Urine 1.022  1.005 - 1.030   pH 5.0  5.0 - 8.0   Glucose, UA NEGATIVE  NEGATIVE mg/dL   Hgb urine dipstick NEGATIVE  NEGATIVE   Bilirubin Urine SMALL (*) NEGATIVE   Ketones, ur NEGATIVE  NEGATIVE mg/dL   Protein, ur NEGATIVE  NEGATIVE mg/dL   Urobilinogen, UA 1.0  0.0 - 1.0 mg/dL   Nitrite NEGATIVE  NEGATIVE   Leukocytes, UA NEGATIVE  NEGATIVE   Comment: MICROSCOPIC NOT DONE ON URINES WITH NEGATIVE PROTEIN, BLOOD, LEUKOCYTES, NITRITE, OR GLUCOSE <1000 mg/dL.  GLUCOSE, CAPILLARY     Status: Abnormal   Collection Time    12/22/13 11:46 PM      Result Value Ref Range   Glucose-Capillary 101 (*) 70 - 99 mg/dL   Comment 1 Notify RN    GLUCOSE, CAPILLARY     Status: None   Collection Time    12/23/13  3:52 AM      Result Value Ref Range   Glucose-Capillary 91  70 - 99 mg/dL   Comment 1 Notify RN    MAGNESIUM     Status: Abnormal   Collection Time    12/23/13  5:55 AM      Result Value Ref Range   Magnesium 1.1 (*) 1.5 - 2.5 mg/dL  PHOSPHORUS     Status: None   Collection Time    12/23/13  5:55 AM      Result Value Ref Range   Phosphorus 4.3  2.3 - 4.6 mg/dL  TSH     Status: None   Collection Time    12/23/13  5:55 AM      Result Value Ref Range   TSH 2.020  0.350 - 4.500 uIU/mL   Comment: Performed at [place] PANEL     Status: Abnormal   Collection Time    12/23/13  5:55  AM      Result Value Ref Range   Sodium 142  137 - 147 mEq/L   Potassium 3.5 (*) 3.7 - 5.3 mEq/L   Chloride 103  96 - 112 mEq/L   CO2 27  19 - 32 mEq/L   Glucose, Bld 91  70 - 99 mg/dL   BUN 7  6 - 23 mg/dL   Creatinine, Ser 0.84  0.50 - 1.35 mg/dL   Calcium 8.5  8.4 - 10.5 mg/dL   Total Protein 6.1  6.0 - 8.3 g/dL   Albumin 3.2 (*) 3.5 - 5.2 g/dL   AST 31  0 - 37 U/L   ALT 31  0 - 53 U/L   Alkaline Phosphatase 84  39 - 117 U/L   Total Bilirubin 0.8  0.3 - 1.2 mg/dL   GFR calc non Af Amer >90  >90 mL/min   GFR calc Af Amer >90  >90 mL/min   Comment: (NOTE)     The eGFR has been calculated using the CKD EPI equation.     This calculation has not been validated in all clinical situations.     eGFR's persistently <90 mL/min signify possible Chronic Kidney     Disease.  CBC     Status: Abnormal  Collection Time    12/23/13  5:55 AM      Result Value Ref Range   WBC 6.7  4.0 - 10.5 K/uL   RBC 4.36  4.22 - 5.81 MIL/uL   Hemoglobin 12.8 (*) 13.0 - 17.0 g/dL   Comment: SPECIMEN CHECKED FOR CLOTS     DELTA CHECK NOTED   HCT 38.1 (*) 39.0 - 52.0 %   MCV 87.4  78.0 - 100.0 fL   MCH 29.4  26.0 - 34.0 pg   MCHC 33.6  30.0 - 36.0 g/dL   RDW 13.5  11.5 - 15.5 %   Platelets 171  150 - 400 K/uL  GLUCOSE, CAPILLARY     Status: None   Collection Time    12/23/13  8:11 AM      Result Value Ref Range   Glucose-Capillary 92  70 - 99 mg/dL   Comment 1 Notify RN    GLUCOSE, CAPILLARY     Status: Abnormal   Collection Time    12/23/13 11:29 AM      Result Value Ref Range   Glucose-Capillary 103 (*) 70 - 99 mg/dL   Comment 1 Documented in Chart     Comment 2 Notify RN    GLUCOSE, CAPILLARY     Status: None   Collection Time    12/23/13  4:41 PM      Result Value Ref Range   Glucose-Capillary 88  70 - 99 mg/dL   Comment 1 Documented in Chart     Comment 2 Notify RN    GLUCOSE, CAPILLARY     Status: None   Collection Time    12/23/13  8:38 PM      Result Value Ref Range    Glucose-Capillary 88  70 - 99 mg/dL   Comment 1 Notify RN    GLUCOSE, CAPILLARY     Status: None   Collection Time    12/24/13 12:01 AM      Result Value Ref Range   Glucose-Capillary 85  70 - 99 mg/dL   Comment 1 Notify RN    GLUCOSE, CAPILLARY     Status: None   Collection Time    12/24/13  4:06 AM      Result Value Ref Range   Glucose-Capillary 77  70 - 99 mg/dL   Comment 1 Notify RN    CBC     Status: Abnormal   Collection Time    12/24/13  4:10 AM      Result Value Ref Range   WBC 8.8  4.0 - 10.5 K/uL   RBC 4.32  4.22 - 5.81 MIL/uL   Hemoglobin 12.9 (*) 13.0 - 17.0 g/dL   HCT 37.6 (*) 39.0 - 52.0 %   MCV 87.0  78.0 - 100.0 fL   MCH 29.9  26.0 - 34.0 pg   MCHC 34.3  30.0 - 36.0 g/dL   RDW 13.3  11.5 - 15.5 %   Platelets 187  150 - 400 K/uL  BASIC METABOLIC PANEL     Status: Abnormal   Collection Time    12/24/13  4:10 AM      Result Value Ref Range   Sodium 143  137 - 147 mEq/L   Potassium 3.2 (*) 3.7 - 5.3 mEq/L   Chloride 101  96 - 112 mEq/L   CO2 25  19 - 32 mEq/L   Glucose, Bld 76  70 - 99 mg/dL   BUN 8  6 - 23 mg/dL  Creatinine, Ser 0.78  0.50 - 1.35 mg/dL   Calcium 8.9  8.4 - 10.5 mg/dL   GFR calc non Af Amer >90  >90 mL/min   GFR calc Af Amer >90  >90 mL/min   Comment: (NOTE)     The eGFR has been calculated using the CKD EPI equation.     This calculation has not been validated in all clinical situations.     eGFR's persistently <90 mL/min signify possible Chronic Kidney     Disease.  MAGNESIUM     Status: None   Collection Time    12/24/13  4:10 AM      Result Value Ref Range   Magnesium 1.9  1.5 - 2.5 mg/dL  PROTIME-INR     Status: None   Collection Time    12/24/13  4:10 AM      Result Value Ref Range   Prothrombin Time 14.0  11.6 - 15.2 seconds   INR 1.10  0.00 - 1.49  GLUCOSE, CAPILLARY     Status: None   Collection Time    12/24/13  7:37 AM      Result Value Ref Range   Glucose-Capillary 74  70 - 99 mg/dL   Comment 1 Documented in Chart      Comment 2 Notify RN    GLUCOSE, CAPILLARY     Status: None   Collection Time    12/24/13 12:22 PM      Result Value Ref Range   Glucose-Capillary 79  70 - 99 mg/dL   Comment 1 Documented in Chart     Comment 2 Notify RN     Dg Abd 2 Views  12/24/2013   CLINICAL DATA:  Small bowel obstruction.  EXAM: ABDOMEN - 2 VIEW  COMPARISON:  12/22/2013  FINDINGS: NG tube is present with the tip in the proximal stomach. Decreasing gaseous distention of the small bowel. Mild distention persists. Gas within nondistended colon. Prior cholecystectomy. No organomegaly or free air.  Visualized lung fields are clear.  IMPRESSION: Improving small bowel obstruction pattern.   Electronically Signed   By: Rolm Baptise M.D.   On: 12/24/2013 09:00   Dg Abd Acute W/chest  12/22/2013   CLINICAL DATA:  Abdominal pain, nausea.  EXAM: ACUTE ABDOMEN SERIES (ABDOMEN 2 VIEW & CHEST 1 VIEW)  COMPARISON:  12/12/2013.  FINDINGS: Left Port-A-Cath is in place with the tip in the SVC. Heart is normal size. Lungs are clear. No effusions.  Dilated small bowel loops with air-fluid levels throughout the abdomen, worsening since prior study. Decreasing colonic gas. No free air organomegaly. Prior cholecystectomy.  IMPRESSION: Worsening small bowel distention and air-fluid levels compatible with worsening small bowel obstruction.  No acute cardiopulmonary disease.   Electronically Signed   By: Rolm Baptise M.D.   On: 12/22/2013 16:04    Assessment/Plan Metastatic adenocarcinoma with abdominal carcinomatosis Partial small bowel obstruction s/p decompression with NGT with relief Request for palliative venting gastrostomy tube placement. Patient will be NPO after midnight, all blood thinners held, vancomycin ordered pre-procedure, labs and images reviewed. Risks and Benefits discussed with the patient. All of the patient's questions were answered, patient would like to think it all over tonight and plan on moving forward  tomorrow Consent to be obtained 12/25/13 when patient is agreeable to proceed.   Tsosie Billing D PA-C 12/24/2013, 3:18 PM

## 2013-12-24 NOTE — Progress Notes (Signed)
Gen. Surgery:  I have interviewed and examined this patient this morning. I agree with the assessment and treatment plan outlined by Ms. Reibock, NP.   Also, please see my note from yesterday.  Await decision regarding gastrostomy tube placement. Defer decision regarding palliative care consult and ultimate goals of care discussion to Dr. Benay Spice and primary service.   I will be available to assist in his care, as needed.  Edsel Petrin. Dalbert Batman, M.D., [facility] General and Minimally invasive Surgery Breast and Colorectal Surgery

## 2013-12-24 NOTE — Progress Notes (Signed)
Patient ID: Frank Baird, male   DOB: 01-17-1961, 53 y.o.   MRN: [id number]  Subjective: Passed flatus yesterday, no BM. n o n/v.  He is very thirsty.  Over 3L of NGT output.  He has been drinking water, ice chips and popsicles which I think is reasonable given his prognosis.  Deciding on G tube, he is leaning towards having it placed.   Objective:  Vital signs:  Filed Vitals:   12/23/13 1400 12/23/13 2050 12/23/13 2100 12/24/13 0450  BP: 166/76  151/74 148/68  Pulse: 59 62 58 60  Temp: 98.8 F (37.1 C)  98.3 F (36.8 C) 98.5 F (36.9 C)  TempSrc: Oral  Oral Oral  Resp: $Remo'16  20 20  'mALZH$ Height:      Weight:      SpO2: 98%  97% 94%    Last BM Date: 12/22/13  Intake/Output   Yesterday:  06/23 0701 - 06/24 0700 In: 9470 [P.O.:380; I.V.:3127; NG/GT:40] Out: 3950 [Urine:900; Emesis/NG output:3050] This shift:     Bowel function:  Flatus: +  BM: -  Drain: n/a  Physical Exam: General: Pt awake/alert/oriented x4 in no acute distress Chest: cta.  No chest wall pain w good excursion CV:  Pulses intact.  Regular rhythm MS: Normal AROM mjr joints.  No obvious deformity Abdomen: Soft.  Nondistended. +bs, mild tenderness.  Palpable masses.  No evidence of peritonitis.  No incarcerated hernias. Ext:  SCDs BLE.  No mjr edema.  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   Gout    Results:   Labs: Results for orders placed during the hospital encounter of 12/22/13 (from the past 48 hour(s))  CBC WITH DIFFERENTIAL     Status: None   Collection Time    12/22/13  3:42 PM      Result Value Ref Range   WBC 10.3  4.0 - 10.5 K/uL   RBC 4.91  4.22 - 5.81 MIL/uL   Hemoglobin 14.4  13.0 - 17.0 g/dL   HCT 42.9  39.0 - 52.0 %   MCV 87.4  78.0 - 100.0 fL   MCH 29.3  26.0 - 34.0 pg   MCHC 33.6  30.0 - 36.0 g/dL   RDW  13.7  11.5 - 15.5 %   Platelets 237  150 - 400 K/uL   Neutrophils Relative % 68  43 - 77 %   Neutro Abs 7.1  1.7 - 7.7 K/uL   Lymphocytes Relative 22  12 - 46 %   Lymphs Abs 2.2  0.7 - 4.0 K/uL   Monocytes Relative 8  3 - 12 %   Monocytes Absolute 0.8  0.1 - 1.0 K/uL   Eosinophils Relative 2  0 - 5 %   Eosinophils Absolute 0.2  0.0 - 0.7 K/uL   Basophils Relative 0  0 - 1 %   Basophils Absolute 0.0  0.0 - 0.1 K/uL  COMPREHENSIVE METABOLIC PANEL     Status: Abnormal   Collection Time    12/22/13  3:42 PM      Result Value Ref Range   Sodium 137  137 - 147 mEq/L   Potassium 4.1  3.7 - 5.3 mEq/L   Chloride 98  96 - 112 mEq/L   CO2 24  19 - 32 mEq/L   Glucose, Bld  117 (*) 70 - 99 mg/dL   BUN 8  6 - 23 mg/dL   Creatinine, Ser 0.90  0.50 - 1.35 mg/dL   Calcium 9.4  8.4 - 10.5 mg/dL   Total Protein 7.3  6.0 - 8.3 g/dL   Albumin 3.8  3.5 - 5.2 g/dL   AST 40 (*) 0 - 37 U/L   ALT 36  0 - 53 U/L   Alkaline Phosphatase 96  39 - 117 U/L   Total Bilirubin 0.6  0.3 - 1.2 mg/dL   GFR calc non Af Amer >90  >90 mL/min   GFR calc Af Amer >90  >90 mL/min   Comment: (NOTE)     The eGFR has been calculated using the CKD EPI equation.     This calculation has not been validated in all clinical situations.     eGFR's persistently <90 mL/min signify possible Chronic Kidney     Disease.  LIPASE, BLOOD     Status: None   Collection Time    12/22/13  3:42 PM      Result Value Ref Range   Lipase 18  11 - 59 U/L  URIC ACID     Status: None   Collection Time    12/22/13  3:48 PM      Result Value Ref Range   Uric Acid, Serum 6.4  4.0 - 7.8 mg/dL  HEMOGLOBIN A1C     Status: Abnormal   Collection Time    12/22/13  3:48 PM      Result Value Ref Range   Hemoglobin A1C 6.6 (*) <5.7 %   Comment: (NOTE)                                                                               According to the ADA Clinical Practice Recommendations for 2011, when     HbA1c is used as a screening test:      >=6.5%    Diagnostic of Diabetes Mellitus               (if abnormal result is confirmed)     5.7-6.4%   Increased risk of developing Diabetes Mellitus     References:Diagnosis and Classification of Diabetes Mellitus,Diabetes     KGUR,4270,62(BJSEG 1):S62-S69 and Standards of Medical Care in             Diabetes - 2011,Diabetes Care,2011,34 (Suppl 1):S11-S61.   Mean Plasma Glucose 143 (*) <117 mg/dL   Comment: Performed at [facility] MICROSCOPIC     Status: Abnormal   Collection Time    12/22/13 10:48 PM      Result Value Ref Range   Color, Urine AMBER (*) YELLOW   Comment: BIOCHEMICALS MAY BE AFFECTED BY COLOR   APPearance CLEAR  CLEAR   Specific Gravity, Urine 1.022  1.005 - 1.030   pH 5.0  5.0 - 8.0   Glucose, UA NEGATIVE  NEGATIVE mg/dL   Hgb urine dipstick NEGATIVE  NEGATIVE   Bilirubin Urine SMALL (*) NEGATIVE   Ketones, ur NEGATIVE  NEGATIVE mg/dL   Protein, ur NEGATIVE  NEGATIVE mg/dL   Urobilinogen, UA 1.0  0.0 - 1.0  mg/dL   Nitrite NEGATIVE  NEGATIVE   Leukocytes, UA NEGATIVE  NEGATIVE   Comment: MICROSCOPIC NOT DONE ON URINES WITH NEGATIVE PROTEIN, BLOOD, LEUKOCYTES, NITRITE, OR GLUCOSE <1000 mg/dL.  GLUCOSE, CAPILLARY     Status: Abnormal   Collection Time    12/22/13 11:46 PM      Result Value Ref Range   Glucose-Capillary 101 (*) 70 - 99 mg/dL   Comment 1 Notify RN    GLUCOSE, CAPILLARY     Status: None   Collection Time    12/23/13  3:52 AM      Result Value Ref Range   Glucose-Capillary 91  70 - 99 mg/dL   Comment 1 Notify RN    MAGNESIUM     Status: Abnormal   Collection Time    12/23/13  5:55 AM      Result Value Ref Range   Magnesium 1.1 (*) 1.5 - 2.5 mg/dL  PHOSPHORUS     Status: None   Collection Time    12/23/13  5:55 AM      Result Value Ref Range   Phosphorus 4.3  2.3 - 4.6 mg/dL  TSH     Status: None   Collection Time    12/23/13  5:55 AM      Result Value Ref Range   TSH 2.020  0.350 - 4.500 uIU/mL    Comment: Performed at [facility] PANEL     Status: Abnormal   Collection Time    12/23/13  5:55 AM      Result Value Ref Range   Sodium 142  137 - 147 mEq/L   Potassium 3.5 (*) 3.7 - 5.3 mEq/L   Chloride 103  96 - 112 mEq/L   CO2 27  19 - 32 mEq/L   Glucose, Bld 91  70 - 99 mg/dL   BUN 7  6 - 23 mg/dL   Creatinine, Ser 0.84  0.50 - 1.35 mg/dL   Calcium 8.5  8.4 - 10.5 mg/dL   Total Protein 6.1  6.0 - 8.3 g/dL   Albumin 3.2 (*) 3.5 - 5.2 g/dL   AST 31  0 - 37 U/L   ALT 31  0 - 53 U/L   Alkaline Phosphatase 84  39 - 117 U/L   Total Bilirubin 0.8  0.3 - 1.2 mg/dL   GFR calc non Af Amer >90  >90 mL/min   GFR calc Af Amer >90  >90 mL/min   Comment: (NOTE)     The eGFR has been calculated using the CKD EPI equation.     This calculation has not been validated in all clinical situations.     eGFR's persistently <90 mL/min signify possible Chronic Kidney     Disease.  CBC     Status: Abnormal   Collection Time    12/23/13  5:55 AM      Result Value Ref Range   WBC 6.7  4.0 - 10.5 K/uL   RBC 4.36  4.22 - 5.81 MIL/uL   Hemoglobin 12.8 (*) 13.0 - 17.0 g/dL   Comment: SPECIMEN CHECKED FOR CLOTS     DELTA CHECK NOTED   HCT 38.1 (*) 39.0 - 52.0 %   MCV 87.4  78.0 - 100.0 fL   MCH 29.4  26.0 - 34.0 pg   MCHC 33.6  30.0 - 36.0 g/dL   RDW 13.5  11.5 - 15.5 %   Platelets 171  150 - 400 K/uL  GLUCOSE, CAPILLARY  Status: None   Collection Time    12/23/13  8:11 AM      Result Value Ref Range   Glucose-Capillary 92  70 - 99 mg/dL   Comment 1 Notify RN    GLUCOSE, CAPILLARY     Status: Abnormal   Collection Time    12/23/13 11:29 AM      Result Value Ref Range   Glucose-Capillary 103 (*) 70 - 99 mg/dL   Comment 1 Documented in Chart     Comment 2 Notify RN    GLUCOSE, CAPILLARY     Status: None   Collection Time    12/23/13  4:41 PM      Result Value Ref Range   Glucose-Capillary 88  70 - 99 mg/dL   Comment 1 Documented in Chart     Comment 2  Notify RN    GLUCOSE, CAPILLARY     Status: None   Collection Time    12/23/13  8:38 PM      Result Value Ref Range   Glucose-Capillary 88  70 - 99 mg/dL   Comment 1 Notify RN    GLUCOSE, CAPILLARY     Status: None   Collection Time    12/24/13 12:01 AM      Result Value Ref Range   Glucose-Capillary 85  70 - 99 mg/dL   Comment 1 Notify RN    GLUCOSE, CAPILLARY     Status: None   Collection Time    12/24/13  4:06 AM      Result Value Ref Range   Glucose-Capillary 77  70 - 99 mg/dL   Comment 1 Notify RN    CBC     Status: Abnormal   Collection Time    12/24/13  4:10 AM      Result Value Ref Range   WBC 8.8  4.0 - 10.5 K/uL   RBC 4.32  4.22 - 5.81 MIL/uL   Hemoglobin 12.9 (*) 13.0 - 17.0 g/dL   HCT 37.6 (*) 39.0 - 52.0 %   MCV 87.0  78.0 - 100.0 fL   MCH 29.9  26.0 - 34.0 pg   MCHC 34.3  30.0 - 36.0 g/dL   RDW 13.3  11.5 - 15.5 %   Platelets 187  150 - 400 K/uL  BASIC METABOLIC PANEL     Status: Abnormal   Collection Time    12/24/13  4:10 AM      Result Value Ref Range   Sodium 143  137 - 147 mEq/L   Potassium 3.2 (*) 3.7 - 5.3 mEq/L   Chloride 101  96 - 112 mEq/L   CO2 25  19 - 32 mEq/L   Glucose, Bld 76  70 - 99 mg/dL   BUN 8  6 - 23 mg/dL   Creatinine, Ser 0.78  0.50 - 1.35 mg/dL   Calcium 8.9  8.4 - 10.5 mg/dL   GFR calc non Af Amer >90  >90 mL/min   GFR calc Af Amer >90  >90 mL/min   Comment: (NOTE)     The eGFR has been calculated using the CKD EPI equation.     This calculation has not been validated in all clinical situations.     eGFR's persistently <90 mL/min signify possible Chronic Kidney     Disease.  MAGNESIUM     Status: None   Collection Time    12/24/13  4:10 AM      Result Value Ref Range   Magnesium  1.9  1.5 - 2.5 mg/dL  PROTIME-INR     Status: None   Collection Time    12/24/13  4:10 AM      Result Value Ref Range   Prothrombin Time 14.0  11.6 - 15.2 seconds   INR 1.10  0.00 - 1.49    Imaging / Studies: Dg Abd Acute  W/chest  12/22/2013   CLINICAL DATA:  Abdominal pain, nausea.  EXAM: ACUTE ABDOMEN SERIES (ABDOMEN 2 VIEW & CHEST 1 VIEW)  COMPARISON:  12/12/2013.  FINDINGS: Left Port-A-Cath is in place with the tip in the SVC. Heart is normal size. Lungs are clear. No effusions.  Dilated small bowel loops with air-fluid levels throughout the abdomen, worsening since prior study. Decreasing colonic gas. No free air organomegaly. Prior cholecystectomy.  IMPRESSION: Worsening small bowel distention and air-fluid levels compatible with worsening small bowel obstruction.  No acute cardiopulmonary disease.   Electronically Signed   By: Rolm Baptise M.D.   On: 12/22/2013 16:04    Scheduled Meds: . fentaNYL  75 mcg Transdermal Q72H  . insulin aspart  0-9 Units Subcutaneous 6 times per day  . nicotine  21 mg Transdermal Daily  . pantoprazole (PROTONIX) IV  40 mg Intravenous QHS  . potassium chloride  10 mEq Intravenous Q1 Hr x 4  . vancomycin  1,000 mg Intravenous Once   Continuous Infusions: . sodium chloride 1,000 mL (12/23/13 2218)   PRN Meds:.acetaminophen, acetaminophen, albuterol, HYDROmorphone (DILAUDID) injection, LORazepam, ondansetron (ZOFRAN) IV, phenol   Antibiotics: Anti-infectives   Start     Dose/Rate Route Frequency Ordered Stop   12/24/13 1400  vancomycin (VANCOCIN) IVPB 1000 mg/200 mL premix     1,000 mg 200 mL/hr over 60 Minutes Intravenous  Once 12/24/13 0716        Assessment/Plan Carcinomatosis  Recurrent small bowel obstruction -not a surgical candidate -continue with NGT decompression, may have ice chips/popsicles -still deciding whether to proceed with IR G-tube placement -check films -supplement k per primary team  Erby Pian, [facility] Pager [phone] Office [phone]  12/24/2013 7:39 AM

## 2013-12-24 NOTE — Progress Notes (Signed)
Patient ID: Frank Baird, male   DOB: 10/15/1960, 53 y.o.   MRN: [id number] [facility] PROGRESS NOTE  Frank Baird:[id number] DOB: 07/18/1960 DOA: 12/22/2013 PCP: Odette Fraction, MD  Brief narrative: 53 y.o. 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 Benay Spice receiving FOLFIRI. Had recent admition for SBO that was treated conservatively. He had a surgical consult done at that time and it was thought that SBO was due to progressive carcinomatosis with multiple points of intestinal involvement. No operative intervention was recommended at that time. Patient presented this time with 3 day hx of abdominal pain, nausea, and watery BM. He stated although he felt better initially after discharge, the belching started back soon there after. Patient recognized symtoms of SBO and attempted to decrease PO intake but continued to have symptoms. He presented to [facility] ER and was found to have SBO. NGT was placed and surgical consult was called but recommend further discussion with oncology given heavy tumor burden.   Assessment/Plan: SBO - continue conservative management, NGT for decompression. KUB 6-24 Improving small bowel obstruction pattern.  - provide analgesia and antiemetics as needed - appreciate surgery and oncology input  -For G tube placement tomorrow.   Abdominal carcinomatosis  - with multiple biopsies on 11/13/2012 confirming metastatic adenocarcinoma, immunophenotype most consistent with an upper gastrointestinal or pancreaticobiliary primary  COPD - clinically compensated at this time - oxygen saturations at target range - provide nicotine patch   Hypokalemia - in the setting of hypomagnesemia -IV KCl.  -repeat labs in am.   Pain secondary to carcinomatosis. - analgesia as needed   Severe malnutrition - secondary to progressive carcinomatosis - pt lost 7 lbs since last admission - NPO for now   DVT prophylaxis:  Lovenox   Code Status: full code  Family Communication: plan of care discussed with the patient Disposition Plan: home when stable   [name], MD  [facility] Pager [phone]  If 7PM-7AM, please contact night-coverage [email or web address] Password [email or web address] 12/24/2013, 2:04 PM   LOS: 2 days   Consultants:  Oncology  Surgery  Procedures:  None  Antibiotics:  None  HPI/Subjective: Feeling better. Passing gas. He agree to have G tube placed.   Objective: Filed Vitals:   12/23/13 1400 12/23/13 2050 12/23/13 2100 12/24/13 0450  BP: 166/76  151/74 148/68  Pulse: 59 62 58 60  Temp: 98.8 F (37.1 C)  98.3 F (36.8 C) 98.5 F (36.9 C)  TempSrc: Oral  Oral Oral  Resp: 16  20 20   Height:      Weight:      SpO2: 98%  97% 94%    Intake/Output Summary (Last 24 hours) at 12/24/13 1404 Last data filed at 12/24/13 0751  Gross per 24 hour  Intake   3577 ml  Output   3300 ml  Net    277 ml    Exam:   General:  Pt is alert, follows commands appropriately, not in acute distress  Cardiovascular: Regular rate and rhythm, S1/S2, no murmurs  Respiratory: Clear to auscultation bilaterally, no wheezing, no crackles, no rhonchi  Abdomen: Soft, tender, distended, bowel sounds present  Extremities: No edema, pulses DP and PT palpable bilaterally   Data Reviewed: Basic Metabolic Panel:  Recent Labs Lab 12/22/13 1542 12/23/13 0555 12/24/13 0410  NA 137 142 143  K 4.1 3.5* 3.2*  CL 98 103 101  CO2 24 27 25   GLUCOSE 117* 91 76  BUN 8 7 8   CREATININE  0.90 0.84 0.78  CALCIUM 9.4 8.5 8.9  MG  --  1.1* 1.9  PHOS  --  4.3  --    Liver Function Tests:  Recent Labs Lab 12/22/13 1542 12/23/13 0555  AST 40* 31  ALT 36 31  ALKPHOS 96 84  BILITOT 0.6 0.8  PROT 7.3 6.1  ALBUMIN 3.8 3.2*    Recent Labs Lab 12/22/13 1542  LIPASE 18    CBC:  Recent Labs Lab 12/22/13 1542 12/23/13 0555 12/24/13 0410  WBC 10.3 6.7 8.8  NEUTROABS 7.1  --   --    HGB 14.4 12.8* 12.9*  HCT 42.9 38.1* 37.6*  MCV 87.4 87.4 87.0  PLT 237 171 187   CBG:  Recent Labs Lab 12/23/13 2038 12/24/13 0001 12/24/13 0406 12/24/13 0737 12/24/13 1222  GLUCAP 88 85 77 74 79    Recent Results (from the past 240 hour(s))  CLOSTRIDIUM DIFFICILE BY PCR     Status: None   Collection Time    12/14/13  2:21 PM      Result Value Ref Range Status   C difficile by pcr NEGATIVE  NEGATIVE Final   Comment: Performed at [place]     Status: None   Collection Time    12/14/13  2:21 PM      Result Value Ref Range Status   Specimen Description STOOL   Final   Special Requests NONE   Final   Culture     Final   Value: NO SALMONELLA, SHIGELLA, CAMPYLOBACTER, YERSINIA, OR E.COLI 0157:H7 ISOLATED     Performed at [facility]   Report Status 12/18/2013 FINAL   Final     Studies: Dg Abd 2 Views  12/24/2013   CLINICAL DATA:  Small bowel obstruction.  EXAM: ABDOMEN - 2 VIEW  COMPARISON:  12/22/2013  FINDINGS: NG tube is present with the tip in the proximal stomach. Decreasing gaseous distention of the small bowel. Mild distention persists. Gas within nondistended colon. Prior cholecystectomy. No organomegaly or free air.  Visualized lung fields are clear.  IMPRESSION: Improving small bowel obstruction pattern.   Electronically Signed   By: Rolm Baptise M.D.   On: 12/24/2013 09:00   Dg Abd Acute W/chest  12/22/2013   CLINICAL DATA:  Abdominal pain, nausea.  EXAM: ACUTE ABDOMEN SERIES (ABDOMEN 2 VIEW & CHEST 1 VIEW)  COMPARISON:  12/12/2013.  FINDINGS: Left Port-A-Cath is in place with the tip in the SVC. Heart is normal size. Lungs are clear. No effusions.  Dilated small bowel loops with air-fluid levels throughout the abdomen, worsening since prior study. Decreasing colonic gas. No free air organomegaly. Prior cholecystectomy.  IMPRESSION: Worsening small bowel distention and air-fluid levels compatible with worsening small bowel  obstruction.  No acute cardiopulmonary disease.   Electronically Signed   By: Rolm Baptise M.D.   On: 12/22/2013 16:04    Scheduled Meds: . fentaNYL  75 mcg Transdermal Q72H  . insulin aspart  0-9 Units Subcutaneous 6 times per day  . nicotine  21 mg Transdermal Daily  . pantoprazole (PROTONIX) IV  40 mg Intravenous QHS  . [START ON 12/25/2013] vancomycin  1,000 mg Intravenous Once   Continuous Infusions: . sodium chloride 100 mL/hr at 12/24/13 1214

## 2013-12-25 ENCOUNTER — Inpatient Hospital Stay (HOSPITAL_COMMUNITY): Payer: Federal, State, Local not specified - PPO

## 2013-12-25 DIAGNOSIS — R109 Unspecified abdominal pain: Secondary | ICD-10-CM

## 2013-12-25 DIAGNOSIS — M109 Gout, unspecified: Secondary | ICD-10-CM

## 2013-12-25 LAB — GLUCOSE, CAPILLARY
GLUCOSE-CAPILLARY: 114 mg/dL — AB (ref 70–99)
GLUCOSE-CAPILLARY: 88 mg/dL (ref 70–99)
Glucose-Capillary: 68 mg/dL — ABNORMAL LOW (ref 70–99)
Glucose-Capillary: 133 mg/dL — ABNORMAL HIGH (ref 70–99)
Glucose-Capillary: 88 mg/dL (ref 70–99)

## 2013-12-25 LAB — BASIC METABOLIC PANEL
BUN: 7 mg/dL (ref 6–23)
CHLORIDE: 102 meq/L (ref 96–112)
CO2: 24 mEq/L (ref 19–32)
Calcium: 8.7 mg/dL (ref 8.4–10.5)
Creatinine, Ser: 0.74 mg/dL (ref 0.50–1.35)
GFR calc non Af Amer: >90 mL/min (ref >90)
Glucose, Bld: 98 mg/dL (ref 70–99)
POTASSIUM: 3.7 meq/L (ref 3.7–5.3)
Sodium: 141 mEq/L (ref 137–147)

## 2013-12-25 LAB — MAGNESIUM: MAGNESIUM: 1.6 mg/dL (ref 1.5–2.5)

## 2013-12-25 MED ORDER — BUPIVACAINE HCL (PF) 0.25 % IJ SOLN
INTRAMUSCULAR | Status: AC
  Filled 2013-12-25: qty 30

## 2013-12-25 MED ORDER — HYDROMORPHONE HCL PF 2 MG/ML IJ SOLN
2.0000 mg | INTRAMUSCULAR | Status: DC | PRN
  Administered 2013-12-25 – 2013-12-26 (×7): 2 mg via INTRAVENOUS
  Filled 2013-12-25 (×7): qty 1

## 2013-12-25 MED ORDER — FENTANYL CITRATE 0.05 MG/ML IJ SOLN
INTRAMUSCULAR | Status: AC | PRN
  Administered 2013-12-25: 25 ug via INTRAVENOUS
  Administered 2013-12-25: 50 ug via INTRAVENOUS
  Administered 2013-12-25: 25 ug via INTRAVENOUS
  Administered 2013-12-25: 100 ug via INTRAVENOUS

## 2013-12-25 MED ORDER — MAGNESIUM SULFATE 40 MG/ML IJ SOLN
2.0000 g | Freq: Once | INTRAMUSCULAR | Status: AC
  Administered 2013-12-25: 2 g via INTRAVENOUS
  Filled 2013-12-25: qty 50

## 2013-12-25 MED ORDER — DEXTROSE-NACL 5-0.9 % IV SOLN
INTRAVENOUS | Status: DC
  Administered 2013-12-25 – 2013-12-26 (×3): via INTRAVENOUS

## 2013-12-25 MED ORDER — MIDAZOLAM HCL 2 MG/2ML IJ SOLN
INTRAMUSCULAR | Status: AC | PRN
  Administered 2013-12-25 (×2): 2 mg via INTRAVENOUS

## 2013-12-25 MED ORDER — IOHEXOL 300 MG/ML  SOLN
10.0000 mL | Freq: Once | INTRAMUSCULAR | Status: AC | PRN
  Administered 2013-12-25: 1 mL

## 2013-12-25 MED ORDER — FENTANYL CITRATE 0.05 MG/ML IJ SOLN
INTRAMUSCULAR | Status: AC
  Filled 2013-12-25: qty 4

## 2013-12-25 MED ORDER — GLUCAGON HCL (RDNA) 1 MG IJ SOLR
INTRAMUSCULAR | Status: AC | PRN
  Administered 2013-12-25: 1 mg via INTRAVENOUS

## 2013-12-25 MED ORDER — MIDAZOLAM HCL 2 MG/2ML IJ SOLN
INTRAMUSCULAR | Status: AC
  Filled 2013-12-25: qty 4

## 2013-12-25 MED ORDER — LIDOCAINE HCL 1 % IJ SOLN
INTRAMUSCULAR | Status: AC
  Filled 2013-12-25: qty 20

## 2013-12-25 MED ORDER — GLUCAGON HCL RDNA (DIAGNOSTIC) 1 MG IJ SOLR
INTRAMUSCULAR | Status: AC
  Filled 2013-12-25: qty 1

## 2013-12-25 MED ORDER — GLUCAGON HCL RDNA (DIAGNOSTIC) 1 MG IJ SOLR: 1.0000 mg | Freq: Once | INTRAMUSCULAR | Status: AC | PRN

## 2013-12-25 MED ORDER — ENOXAPARIN SODIUM 40 MG/0.4ML ~~LOC~~ SOLN
40.0000 mg | Freq: Every day | SUBCUTANEOUS | Status: DC
  Administered 2013-12-26 – 2013-12-29 (×4): 40 mg via SUBCUTANEOUS
  Filled 2013-12-25 (×6): qty 0.4

## 2013-12-25 NOTE — Progress Notes (Signed)
INPATIENT PROGRESS NOTE  Subjective:  [name] is passing flatus. No nausea. He has tolerated liquids with the NG tube off of suction. He has decided to proceed with the palliative gastrostomy tube.  Oncological History:  1. 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 CT of the chest 11/18/2012 with borderline enlarged mediastinal nodes, gastrohepatic ligament nodes and omental/peritoneal nodularity. Negative upper endoscopy. Normal CEA on 12/13/2012. Status post cycle 1 FOLFOX 12/11/2012. Status post cycle 2 FOLFOX 12/25/2012. Status post cycle 3 on 01/15/2013. Cycle 4 on 01/29/2013; cycle 5 on 02/12/2013; cycle 6 on 02/26/2013. Restaging CT evaluation showed improvement. He completed cycle 8 on 03/26/2013. Oxaliplatin was discontinued following cycle 8 due to progressive neuropathy symptoms. He began maintenance Xeloda 04/24/2013. Xeloda dose reduced beginning with cycle 5 on 07/26/2013.  CA 19-9 increased at 297.4 on 09/01/2013  CTs 09/01/2013 with decreased chest and abdomen lymphadenopathy, increased omental nodularity  Cycle 1 of FOLFIRI 09/11/2013.  Cycle 2 FOLFIRI 10/09/2013.  Cycle 3 FOLFIRI 10/23/2013  Cycle 4 FOLFIRI 11/25/2013 2. Cecal/appendix mass noted at the time of exploratory laparotomy 11/13/2012. 3. COPD. 4. Pain secondary to carcinomatosis. 5. Chronic back pain. Stable.  6. Status post Port-A-Cath placement 12/02/2012. 7. Delayed nausea following cycle 1 FOLFOX. Aloxi was added with cycle 2. He had delayed nausea following cycle 2. Emend was added beginning with cycle 3. Prophylactic Decadron added beginning with cycle 8. He did not take as prescribed. He again had delayed nausea. 8. Diarrhea following cycle 2 FOLFOX. Question related to 5-fluorouracil. The diarrhea improved with Lomotil. The 5-fluorouracil bolus was  eliminated and the dose of the 5 fluorouracil infusion was reduced beginning with cycle 3.  9. Left mid back pain/tenderness just lateral to the spine. Question etiology. Duragesic patch increased to 75 mcg every 3 days. He takes oxycodone as needed. He continues to have back pain. 10. Anorexia/weight loss. He began a trial of Megace following office visit 01/08/2013. Appetite became better at the time. No longer taking Megace.  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 Increased with moderate loss of vibratory sense at the fingertips per tuning fork exam and interfering with activity when here 04/09/2013. Oxaliplatin was discontinued. Improved. 12. Nausea/vomiting with cycle 1 Xeloda. He has persistent nausea while on Xeloda. Improved with dose reduction beginning with cycle 5. 13. Partial small bowel obstruction confirmed on plain x-ray 12/10/2013 and CT 12/11/2013 after presenting with frequent belching and abdominal bloating   Scheduled Meds: . bupivacaine (PF)      . fentaNYL  75 mcg Transdermal Q72H  . fentaNYL      . glucagon (human recombinant)      . insulin aspart  0-9 Units Subcutaneous 6 times per day  . lidocaine      . midazolam      . nicotine  21 mg Transdermal Daily  . pantoprazole (PROTONIX) IV  40 mg Intravenous QHS   Continuous Infusions: . dextrose 5 % and 0.9% NaCl 100 mL/hr at 12/25/13 0416   PRN Meds:.acetaminophen, acetaminophen, albuterol, glucagon (human recombinant), HYDROmorphone (DILAUDID) injection, LORazepam, ondansetron (ZOFRAN) IV, oxyCODONE, phenol   Objective: Vital signs in last 24 hours: Blood pressure 156/75, pulse 50, temperature 99.3 F (37.4 C), temperature source Oral, resp. rate 20, height 5\' 10"  (1.778 m), weight 174 lb (78.926 kg), SpO2 97.00%.  Intake/Output from previous day: 06/24 0701 - 06/25  0700 In: 61 [P.O.:60] Out: 850  [Urine:850]  Physical Exam: Abdomen-soft, palpable fullness in the inferior to the umbilicus Lungs-clear bilaterally Cardiac-regular rate and rhythm  Lab Results:  Recent Labs  12/23/13 0555 12/24/13 0410  WBC 6.7 8.8  HGB 12.8* 12.9*  HCT 38.1* 37.6*  PLT 171 187    BMET  Recent Labs  12/24/13 0410 12/25/13 0500  NA 143 141  K 3.2* 3.7  CL 101 102  CO2 25 24  GLUCOSE 76 98  BUN 8 7  CREATININE 0.78 0.74  CALCIUM 8.9 8.7    Studies/Results: Dg Abd Acute W/chest  12/22/2013   CLINICAL DATA:  Abdominal pain, nausea.  EXAM: ACUTE ABDOMEN SERIES (ABDOMEN 2 VIEW & CHEST 1 VIEW)  COMPARISON:  12/12/2013.  FINDINGS: Left Port-A-Cath is in place with the tip in the SVC. Heart is normal size. Lungs are clear. No effusions.  Dilated small bowel loops with air-fluid levels throughout the abdomen, worsening since prior study. Decreasing colonic gas. No free air organomegaly. Prior cholecystectomy.  IMPRESSION: Worsening small bowel distention and air-fluid levels compatible with worsening small bowel obstruction.  No acute cardiopulmonary disease.   Electronically Signed   By: Rolm Baptise M.D.   On: 12/22/2013 16:04     Assessment/Plan:  1.metastatic adenocarcinoma with abdominal carcinomatosis s/p Folfiri , last given 11/25/2013  2.worsening small bowel obstruction confirmed by plain x ray on 6/22 during this admission,, status post NG tube placement   3. Abdominal Pain, secondary to #1    continue narcotic analgesics for pain   4. Anemia, multifactorial    5. Full Code  6. Malnutrition due to #1   7. Gout       LOS: 3 days   Betsy Coder  12/25/2013, 2:25 PM [name] appears stable. He has decided to proceed with placement of the palliative gastrostomy tube. He has tolerated liquids with the NG tube off of suction. Hopefully he will be able to maintain his nutrition with a liquid diet. If not I will recommend home cyclic TNA.  Recommendations: 1.  Placement of palliative gastrostomy tube  2. Continue the Duragesic patch and sublingual oxycodone for pain 3. A liquid diet as tolerated, we will arrange for home TNA if he is unable to maintain his nutrition with a liquid diet  I appreciate the care from the medicine service. I will arrange for outpatient followup during the week of 12/29/2013.

## 2013-12-25 NOTE — Progress Notes (Signed)
Pt was lying in bed, awake, when I arrived. He said he was in a lot of pain. He said nurses were aware. He agreed that he did not feel like talking. Will ck again tomorrow. Ernest Haber Chaplain  12/25/13 1300  Clinical Encounter Type  Visited With Patient

## 2013-12-25 NOTE — Procedures (Signed)
Interventional Radiology Procedure Note  Procedure: Placement of percutaneous 77F pull-through gastrostomy tube. Complications: None Recommendations: - NPO except for sips and chips remainder of today and overnight - Maintain G-tube to LWS until tomorrow morning   Signed,  Criselda Peaches, MD [facility]

## 2013-12-25 NOTE — Progress Notes (Signed)
Patient ID: Frank Baird, male   DOB: 1960/11/25, 53 y.o.   MRN: [id number] [facility] PROGRESS NOTE  Frank Baird BWI:[id number] DOB: 1960/09/28 DOA: 12/22/2013 PCP: Frank Fraction, MD   Assessment/Plan: SBO - continue conservative management. KUB 6-24 Improving small bowel obstruction pattern.  - provide analgesia and antiemetics as needed - appreciate surgery and oncology input  -S/P G tube placement 6-25. -He had small BM, having pain after G tube placement.  -he understand plan for nutrition discussed early with Frank Baird.   Abdominal carcinomatosis  - with multiple biopsies on 11/13/2012 confirming metastatic adenocarcinoma, immunophenotype most consistent with an upper gastrointestinal or pancreaticobiliary primary  Hypoglycemia; continue with D 5 IV fluids for now,  History of diabetes; holding oral medication. Patient NPO. Suspect he wont medications at discharge.   COPD - clinically compensated at this time - oxygen saturations at target range - provide nicotine patch   Hypokalemia; potasium normal range today.  - in the setting of hypomagnesemia -will replete Mg IV.  -repeat labs in am.   Pain secondary to carcinomatosis. - analgesia as needed   Severe malnutrition - secondary to progressive carcinomatosis - pt lost 7 lbs since last admission - NPO for now   DVT prophylaxis: Lovenox   Code Status: full code  Family Communication: plan of care discussed with the patient Disposition Plan: home when stable   Frank Shiley, MD  [facility] Pager [phone]  If 7PM-7AM, please contact night-coverage [email or web address] Password [email or web address] 12/25/2013, 5:13 PM   LOS: 3 days   Consultants:  Oncology  Surgery  Procedures:  None  Antibiotics:  None  HPI/Subjective: Had small BM. He is having pain after G tube placement.   Objective: Filed Vitals:   12/25/13 0926 12/25/13 0931 12/25/13 0936 12/25/13 1331  BP: 157/87 150/82  166/84 156/75  Pulse: 64 61 56 50  Temp:    99.3 F (37.4 C)  TempSrc:    Oral  Resp: 18 19 28 20   Height:      Weight:      SpO2: 94% 91% 94% 97%    Intake/Output Summary (Last 24 hours) at 12/25/13 1713 Last data filed at 12/25/13 1353  Gross per 24 hour  Intake    646 ml  Output   1250 ml  Net   -604 ml    Exam:   General:  Pt is alert, follows commands appropriately, not in acute distress  Cardiovascular: Regular rate and rhythm, S1/S2, no murmurs  Respiratory: Clear to auscultation bilaterally, no wheezing, no crackles, no rhonchi  Abdomen: Soft, tender, distended, bowel sounds present  Extremities: No edema, pulses DP and PT palpable bilaterally   Data Reviewed: Basic Metabolic Panel:  Recent Labs Lab 12/22/13 1542 12/23/13 0555 12/24/13 0410 12/25/13 0500  NA 137 142 143 141  K 4.1 3.5* 3.2* 3.7  CL 98 103 101 102  CO2 24 27 25 24   GLUCOSE 117* 91 76 98  BUN 8 7 8 7   CREATININE 0.90 0.84 0.78 0.74  CALCIUM 9.4 8.5 8.9 8.7  MG  --  1.1* 1.9 1.6  PHOS  --  4.3  --   --    Liver Function Tests:  Recent Labs Lab 12/22/13 1542 12/23/13 0555  AST 40* 31  ALT 36 31  ALKPHOS 96 84  BILITOT 0.6 0.8  PROT 7.3 6.1  ALBUMIN 3.8 3.2*    Recent Labs Lab 12/22/13 1542  LIPASE 18    CBC:  Recent Labs  Lab 12/22/13 1542 12/23/13 0555 12/24/13 0410  WBC 10.3 6.7 8.8  NEUTROABS 7.1  --   --   HGB 14.4 12.8* 12.9*  HCT 42.9 38.1* 37.6*  MCV 87.4 87.4 87.0  PLT 237 171 187   CBG:  Recent Labs Lab 12/24/13 2354 12/25/13 0359 12/25/13 0820 12/25/13 1202 12/25/13 1645  GLUCAP 75 68* 114* 133* 88    No results found for this or any previous visit (from the past 240 hour(s)).   Studies: Dg Abd 2 Views  12/24/2013   CLINICAL DATA:  Small bowel obstruction.  EXAM: ABDOMEN - 2 VIEW  COMPARISON:  12/22/2013  FINDINGS: NG tube is present with the tip in the proximal stomach. Decreasing gaseous distention of the small bowel. Mild distention  persists. Gas within nondistended colon. Prior cholecystectomy. No organomegaly or free air.  Visualized lung fields are clear.  IMPRESSION: Improving small bowel obstruction pattern.   Electronically Signed   By: Rolm Baptise M.D.   On: 12/24/2013 09:00    Scheduled Meds: . bupivacaine (PF)      . fentaNYL  75 mcg Transdermal Q72H  . fentaNYL      . glucagon (human recombinant)      . insulin aspart  0-9 Units Subcutaneous 6 times per day  . lidocaine      . midazolam      . nicotine  21 mg Transdermal Daily  . pantoprazole (PROTONIX) IV  40 mg Intravenous QHS   Continuous Infusions: . dextrose 5 % and 0.9% NaCl 100 mL/hr at 12/25/13 1440

## 2013-12-26 ENCOUNTER — Inpatient Hospital Stay (HOSPITAL_COMMUNITY): Payer: Federal, State, Local not specified - PPO

## 2013-12-26 ENCOUNTER — Telehealth: Payer: Self-pay | Admitting: *Deleted

## 2013-12-26 LAB — BASIC METABOLIC PANEL
BUN: 3 mg/dL — ABNORMAL LOW (ref 6–23)
CALCIUM: 6.5 mg/dL — AB (ref 8.4–10.5)
CHLORIDE: 111 meq/L (ref 96–112)
CO2: 20 mEq/L (ref 19–32)
CREATININE: 0.51 mg/dL (ref 0.50–1.35)
GFR calc non Af Amer: >90 mL/min (ref >90)
Glucose, Bld: 107 mg/dL — ABNORMAL HIGH (ref 70–99)
Potassium: 3.8 mEq/L (ref 3.7–5.3)
Sodium: 143 mEq/L (ref 137–147)

## 2013-12-26 LAB — GLUCOSE, CAPILLARY
GLUCOSE-CAPILLARY: 101 mg/dL — AB (ref 70–99)
Glucose-Capillary: 100 mg/dL — ABNORMAL HIGH (ref 70–99)
Glucose-Capillary: 126 mg/dL — ABNORMAL HIGH (ref 70–99)
Glucose-Capillary: 157 mg/dL — ABNORMAL HIGH (ref 70–99)

## 2013-12-26 LAB — MAGNESIUM: Magnesium: 1.2 mg/dL — ABNORMAL LOW (ref 1.5–2.5)

## 2013-12-26 MED ORDER — HYDROMORPHONE HCL PF 2 MG/ML IJ SOLN
2.0000 mg | INTRAMUSCULAR | Status: DC | PRN
  Administered 2013-12-26 – 2013-12-30 (×21): 2 mg via INTRAVENOUS
  Filled 2013-12-26 (×21): qty 1

## 2013-12-26 MED ORDER — MAGNESIUM SULFATE 40 MG/ML IJ SOLN
2.0000 g | Freq: Once | INTRAMUSCULAR | Status: AC
  Administered 2013-12-26: 2 g via INTRAVENOUS
  Filled 2013-12-26: qty 50

## 2013-12-26 MED ORDER — INSULIN ASPART 100 UNIT/ML ~~LOC~~ SOLN
0.0000 [IU] | Freq: Three times a day (TID) | SUBCUTANEOUS | Status: DC
  Administered 2013-12-26: 2 [IU] via SUBCUTANEOUS
  Administered 2013-12-27 – 2013-12-29 (×3): 1 [IU] via SUBCUTANEOUS

## 2013-12-26 NOTE — Plan of Care (Signed)
Problem: Phase II Progression Outcomes Goal: IV changed to normal saline lock Outcome: Completed/Met Date Met:  12/26/13 NS at [facility] for medications.

## 2013-12-26 NOTE — Plan of Care (Signed)
Problem: Phase I Progression Outcomes Goal: Other Phase I Outcomes/Goals Outcome: Progressing Patient c/o indigestion and bloating. Given protonix with no relief. Patient opened G tube to drain and felt "relief" afterwards.

## 2013-12-26 NOTE — Progress Notes (Signed)
Gen. Surgery:  Patient interviewed and examined. Agree with summary by Ms. Riebock, and the above.  He is having some pain at the G-tube site. Abdomen is otherwise relatively soft with some bowel sounds present. No nausea or vomiting today. G-tube appears to be clamped.  Probable x-rays showed a couple of loops of small bowel in the left upper quadrant the lots of gas throughout the colon.  He has a malignant malignant, partial SBO. Probably has multiple points of partial obstruction. Currently less symptomatic I have increased his dilaudid Allow clear liquid diet. Vent G-tube as necessary.   Frank Baird. Dalbert Batman, M.D., [facility] General and Minimally invasive Surgery Breast and Colorectal Surgery

## 2013-12-26 NOTE — Progress Notes (Signed)
Patient ID: Frank Baird, male   DOB: [birth year], 53 y.o.   MRN: [id number] [facility] PROGRESS NOTE  Frank Baird VEL:[id number] DOB: 05-16-1961 DOA: 12/22/2013 PCP: Odette Fraction, MD   Assessment/Plan: SBO - continue conservative management. KUB 6-24 Improving small bowel obstruction pattern.  - provide analgesia and antiemetics as needed - appreciate surgery and oncology input  -S/P G tube placement 6-25. -He had small BM on 6-25.  -complaining of cramps after G tube placement.  -started on clear diet.   Abdominal carcinomatosis  - with multiple biopsies on 11/13/2012 confirming metastatic adenocarcinoma, immunophenotype most consistent with an upper gastrointestinal or pancreaticobiliary primary  Hypoglycemia; started on clear diet. Will DC fluids and monitor.   History of diabetes; holding oral medication. Patient NPO. Suspect he wont medications at discharge.   COPD - clinically compensated at this time - oxygen saturations at target range - provide nicotine patch   Hypokalemia; potasium normal range today.  - in the setting of hypomagnesemia -will replete Mg IV.  -repeat labs in am.   Pain secondary to carcinomatosis. - analgesia as needed   Severe malnutrition - secondary to progressive carcinomatosis - pt lost 7 lbs since last admission   DVT prophylaxis: Lovenox   Code Status: full code  Family Communication: plan of care discussed with the patient Disposition Plan: home when stable   Frank Shiley, MD  [facility] Pager [phone]  If 7PM-7AM, please contact night-coverage [email or web address] Password [email or web address] 12/26/2013, 1:18 PM   LOS: 4 days   Consultants:  Oncology  Surgery  Procedures:  None  Antibiotics:  None  HPI/Subjective: Still with severe pain abdominal pain, pain worse after G tube placed.   Objective: Filed Vitals:   12/25/13 0936 12/25/13 1331 12/25/13 2012 12/26/13 0416  BP: 166/84 156/75 177/76  142/65  Pulse: 56 50 56 61  Temp:  99.3 F (37.4 C) 99.5 F (37.5 C) 98.6 F (37 C)  TempSrc:  Oral Oral Oral  Resp: 28 20 18 18   Height:      Weight:      SpO2: 94% 97% 98% 98%    Intake/Output Summary (Last 24 hours) at 12/26/13 1318 Last data filed at 12/26/13 0417  Gross per 24 hour  Intake    646 ml  Output    700 ml  Net    -54 ml    Exam:   General:  Pt is alert, follows commands appropriately, not in acute distress  Cardiovascular: Regular rate and rhythm, S1/S2, no murmurs  Respiratory: Clear to auscultation bilaterally, no wheezing, no crackles, no rhonchi  Abdomen: Soft, tender, distended, G tube in placed.   Extremities: No edema, pulses DP and PT palpable bilaterally   Data Reviewed: Basic Metabolic Panel:  Recent Labs Lab 12/22/13 1542 12/23/13 0555 12/24/13 0410 12/25/13 0500 12/26/13 0500  NA 137 142 143 141 143  K 4.1 3.5* 3.2* 3.7 3.8  CL 98 103 101 102 111  CO2 24 27 25 24 20   GLUCOSE 117* 91 76 98 107*  BUN 8 7 8 7  3*  CREATININE 0.90 0.84 0.78 0.74 0.51  CALCIUM 9.4 8.5 8.9 8.7 6.5*  MG  --  1.1* 1.9 1.6 1.2*  PHOS  --  4.3  --   --   --    Liver Function Tests:  Recent Labs Lab 12/22/13 1542 12/23/13 0555  AST 40* 31  ALT 36 31  ALKPHOS 96 84  BILITOT 0.6 0.8  PROT 7.3 6.1  ALBUMIN 3.8 3.2*    Recent Labs Lab 12/22/13 1542  LIPASE 18    CBC:  Recent Labs Lab 12/22/13 1542 12/23/13 0555 12/24/13 0410  WBC 10.3 6.7 8.8  NEUTROABS 7.1  --   --   HGB 14.4 12.8* 12.9*  HCT 42.9 38.1* 37.6*  MCV 87.4 87.4 87.0  PLT 237 171 187   CBG:  Recent Labs Lab 12/25/13 1202 12/25/13 1645 12/25/13 2010 12/26/13 0402 12/26/13 0746  GLUCAP 133* 88 88 100* 126*    No results found for this or any previous visit (from the past 240 hour(s)).   Studies: Ir Gastrostomy Tube Mod Sed  12/26/2013   CLINICAL DATA:  53 year old male with metastatic adenocarcinoma, peritoneal carcinomatosis and chronic gastric outlet/  upper small bowel obstruction. A palliative preventing gastrostomy tube is warranted for patient comfort.  EXAM: PERC PLACEMENT GASTROSTOMY  Date: 12/26/2013  PROCEDURE: 1. Fluoroscopically guided placement of percutaneous pull-through gastrostomy tube. Interventional Radiologist:  Criselda Peaches, MD  ANESTHESIA/SEDATION: Moderate (conscious) sedation was used. Four mg Versed, 200 mcg Fentanyl were administered intravenously. The patient's vital signs were monitored continuously by radiology nursing throughout the procedure. Additionally, 1 mg glucagon was administered intravenously.  Sedation Time: 20 minutes  FLUOROSCOPY TIME:  2 min 6 seconds  CONTRAST:  10 mL Omnipaque administered into the stomach  MEDICATIONS: 1 g vancomycin was administered intravenously within 1 hour of skin incision.  TECHNIQUE: Informed consent was obtained from the patient following explanation of the procedure, risks, benefits and alternatives. The patient understands, agrees and consents for the procedure. All questions were addressed. A time out was performed.  Maximal barrier sterile technique utilized including caps, mask, sterile gowns, sterile gloves, large sterile drape, hand hygiene, and chlorhexadine skin prep.  An angled catheter was advanced over a wire under fluoroscopic guidance through the nose, down the esophagus and into the body of the stomach. The stomach was then insufflated with several 100 ml of air. Fluoroscopy confirmed location of the gastric bubble, as well as inferior displacement of the barium stained colon. Under direct fluoroscopic guidance, a single T-tack was placed, and the anterior gastric wall drawn up against the anterior abdominal wall. Percutaneous access was then obtained into the mid gastric body with an 18 gauge sheath needle. Aspiration of air, and injection of contrast material under fluoroscopy confirmed needle placement.  An Amplatz wire was advanced in the gastric body and the access  needle exchanged for a 9-French vascular sheath. A snare device was advanced through the vascular sheath and an Amplatz wire advanced through the angled catheter. The Amplatz wire was successfully snared and this was pulled up through the esophagus and out the mouth. A 20-French Alinda Dooms MIC-PEG tube was then connected to the snare and pulled through the mouth, down the esophagus, into the stomach and out to the anterior abdominal wall. Hand injection of contrast material confirmed intragastric location. The T-tack retention suture was then cut. The pull through peg tube was then secured with the external bumper and capped.  The patient will be observed for several hours with the newly placed tube on low wall suction to evaluate for any post procedure complication. The patient tolerated the procedure well, there is no immediate complication.  IMPRESSION: Successful placement of a 20 French pull through gastrostomy tube.  Signed,  Criselda Peaches, MD  [facility]  [facility]   Electronically Signed   By: Jacqulynn Cadet M.D.   On: 12/26/2013 07:07  Dg Abd 2 Views  12/26/2013   CLINICAL DATA:  SBO.  EXAM: ABDOMEN - 2 VIEW  COMPARISON:  12/12/2013 and 12/24/2013  FINDINGS: Interval removal nasogastric tube. Placement of percutaneous gastrostomy tube with tip overlying the stomach in the left upper quadrant. Examination demonstrates persistence of air-filled mildly dilated small bowel loops in the left abdomen greater than right without significant change. Air and stool are present throughout the colon. No evidence of free peritoneal air. A few scattered air-fluid levels are present. Remainder the exam is unchanged.  IMPRESSION: No change in several mildly dilated small bowel loops with air-fluid levels likely representing a partial small bowel obstruction.  Gastrostomy tube appears in adequate position over the left upper quadrant.   Electronically Signed    By: Marin Olp M.D.   On: 12/26/2013 08:39    Scheduled Meds: . enoxaparin (LOVENOX) injection  40 mg Subcutaneous QHS  . fentaNYL  75 mcg Transdermal Q72H  . insulin aspart  0-9 Units Subcutaneous 6 times per day  . nicotine  21 mg Transdermal Daily  . pantoprazole (PROTONIX) IV  40 mg Intravenous QHS   Continuous Infusions: . dextrose 5 % and 0.9% NaCl 100 mL/hr at 12/26/13 1028

## 2013-12-26 NOTE — Progress Notes (Signed)
Subjective: Patient states G-tube site without bleeding, he does admit to some abdominal cramping. He has been on liquids today without N/V. He denies any fever or chills.   Objective: Physical Exam: BP 142/65  Pulse 61  Temp(Src) 98.6 F (37 C) (Oral)  Resp 18  Ht 5\' 10"  (1.778 m)  Wt 174 lb (78.926 kg)  BMI 24.97 kg/m2  SpO2 98%  General: A&Ox3, NAD, ambulating the halls Abd: Soft, slightly distended, minimal tenderness around G-tube site, dressing C/D/I without signs of erythema, bleeding or hematoma, hypoBS  Labs: CBC  Recent Labs  12/24/13 0410  WBC 8.8  HGB 12.9*  HCT 37.6*  PLT 187   BMET  Recent Labs  12/25/13 0500 12/26/13 0500  NA 141 143  K 3.7 3.8  CL 102 111  CO2 24 20  GLUCOSE 98 107*  BUN 7 3*  CREATININE 0.74 0.51  CALCIUM 8.7 6.5*   LFT No results found for this basename: PROT, ALBUMIN, AST, ALT, ALKPHOS, BILITOT, BILIDIR, IBILI, LIPASE,  in the last 72 hours PT/INR  Recent Labs  12/24/13 0410  LABPROT 14.0  INR 1.10     Studies/Results: Ir Gastrostomy Tube Mod Sed  12/26/2013   CLINICAL DATA:  53 year old male with metastatic adenocarcinoma, peritoneal carcinomatosis and chronic gastric outlet/ upper small bowel obstruction. A palliative preventing gastrostomy tube is warranted for patient comfort.  EXAM: PERC PLACEMENT GASTROSTOMY  Date: 12/26/2013  PROCEDURE: 1. Fluoroscopically guided placement of percutaneous pull-through gastrostomy tube. Interventional Radiologist:  Criselda Peaches, MD  ANESTHESIA/SEDATION: Moderate (conscious) sedation was used. Four mg Versed, 200 mcg Fentanyl were administered intravenously. The patient's vital signs were monitored continuously by radiology nursing throughout the procedure. Additionally, 1 mg glucagon was administered intravenously.  Sedation Time: 20 minutes  FLUOROSCOPY TIME:  2 min 6 seconds  CONTRAST:  10 mL Omnipaque administered into the stomach  MEDICATIONS: 1 g vancomycin was administered  intravenously within 1 hour of skin incision.  TECHNIQUE: Informed consent was obtained from the patient following explanation of the procedure, risks, benefits and alternatives. The patient understands, agrees and consents for the procedure. All questions were addressed. A time out was performed.  Maximal barrier sterile technique utilized including caps, mask, sterile gowns, sterile gloves, large sterile drape, hand hygiene, and chlorhexadine skin prep.  An angled catheter was advanced over a wire under fluoroscopic guidance through the nose, down the esophagus and into the body of the stomach. The stomach was then insufflated with several 100 ml of air. Fluoroscopy confirmed location of the gastric bubble, as well as inferior displacement of the barium stained colon. Under direct fluoroscopic guidance, a single T-tack was placed, and the anterior gastric wall drawn up against the anterior abdominal wall. Percutaneous access was then obtained into the mid gastric body with an 18 gauge sheath needle. Aspiration of air, and injection of contrast material under fluoroscopy confirmed needle placement.  An Amplatz wire was advanced in the gastric body and the access needle exchanged for a 9-French vascular sheath. A snare device was advanced through the vascular sheath and an Amplatz wire advanced through the angled catheter. The Amplatz wire was successfully snared and this was pulled up through the esophagus and out the mouth. A 20-French Alinda Dooms MIC-PEG tube was then connected to the snare and pulled through the mouth, down the esophagus, into the stomach and out to the anterior abdominal wall. Hand injection of contrast material confirmed intragastric location. The T-tack retention suture was then cut. The pull  through peg tube was then secured with the external bumper and capped.  The patient will be observed for several hours with the newly placed tube on low wall suction to evaluate for any post procedure  complication. The patient tolerated the procedure well, there is no immediate complication.  IMPRESSION: Successful placement of a 20 French pull through gastrostomy tube.  Signed,  Criselda Peaches, MD  [facility]  [facility]   Electronically Signed   By: Jacqulynn Cadet M.D.   On: 12/26/2013 07:07   Dg Abd 2 Views  12/26/2013   CLINICAL DATA:  SBO.  EXAM: ABDOMEN - 2 VIEW  COMPARISON:  12/12/2013 and 12/24/2013  FINDINGS: Interval removal nasogastric tube. Placement of percutaneous gastrostomy tube with tip overlying the stomach in the left upper quadrant. Examination demonstrates persistence of air-filled mildly dilated small bowel loops in the left abdomen greater than right without significant change. Air and stool are present throughout the colon. No evidence of free peritoneal air. A few scattered air-fluid levels are present. Remainder the exam is unchanged.  IMPRESSION: No change in several mildly dilated small bowel loops with air-fluid levels likely representing a partial small bowel obstruction.  Gastrostomy tube appears in adequate position over the left upper quadrant.   Electronically Signed   By: Marin Olp M.D.   On: 12/26/2013 08:39    Assessment/Plan: Metastatic adenocarcinoma with abdominal carcinomatosis  Partial small bowel obstruction s/p decompression with NGT with relief  S/p palliative venting gastrostomy tube placement 6/25, afebrile, site without bleeding or leakage.  May utilize G-tube for decompression when needed.   LOS: 4 days    Rockney Ghee 12/26/2013 12:56 PM

## 2013-12-26 NOTE — Progress Notes (Signed)
Patient ID: Frank Baird, male   DOB: 03-31-1961, 53 y.o.   MRN: [id number]  Subjective: G tube is clamped, no output is recorded.  Denies any nausea or vomiting.  No flatus or BM yet.  Complaining of pain particularly around the G tube.  Objective:  Vital signs:  Filed Vitals:   12/25/13 0936 12/25/13 1331 12/25/13 2012 12/26/13 0416  BP: 166/84 156/75 177/76 142/65  Pulse: 56 50 56 61  Temp:  99.3 F (37.4 C) 99.5 F (37.5 C) 98.6 F (37 C)  TempSrc:  Oral Oral Oral  Resp: $Remo'28 20 18 18  'HegaQ$ Height:      Weight:      SpO2: 94% 97% 98% 98%    Last BM Date: 12/22/13  Intake/Output   Yesterday:  06/25 0701 - 06/26 0700 In: 646 [I.V.:646] Out: 1100 [Urine:1100] This shift:    I/O last 3 completed shifts: In: 57 [I.V.:646] Out: 1800 [Urine:1800]    Physical Exam:  General: Pt awake/alert/oriented x4 in no acute distress  Chest: cta. No chest wall pain w good excursion  CV: Pulses intact. Regular rhythm  MS: Normal AROM mjr joints. No obvious deformity  Abdomen: Soft. Nondistended. +bs, mild tenderness. Palpable masses. No evidence of peritonitis. No incarcerated hernias.  LUQ G tube clamped.    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   Gout    Results:   Labs: Results for orders placed during the hospital encounter of 12/22/13 (from the past 32 hour(s))  GLUCOSE, CAPILLARY     Status: None   Collection Time    12/24/13 12:22 PM      Result Value Ref Range   Glucose-Capillary 79  70 - 99 mg/dL   Comment 1 Documented in Chart     Comment 2 Notify RN    GLUCOSE, CAPILLARY     Status: None   Collection Time    12/24/13  8:00 PM      Result Value Ref Range   Glucose-Capillary 70  70 - 99 mg/dL   Comment 1 Notify RN    GLUCOSE, CAPILLARY     Status: None   Collection Time    12/24/13 11:54 PM      Result Value  Ref Range   Glucose-Capillary 75  70 - 99 mg/dL   Comment 1 Notify RN    GLUCOSE, CAPILLARY     Status: Abnormal   Collection Time    12/25/13  3:59 AM      Result Value Ref Range   Glucose-Capillary 68 (*) 70 - 99 mg/dL   Comment 1 Notify RN    BASIC METABOLIC PANEL     Status: None   Collection Time    12/25/13  5:00 AM      Result Value Ref Range   Sodium 141  137 - 147 mEq/L   Potassium 3.7  3.7 - 5.3 mEq/L   Chloride 102  96 - 112 mEq/L   CO2 24  19 - 32 mEq/L   Glucose, Bld 98  70 - 99 mg/dL   BUN 7  6 - 23 mg/dL   Creatinine, Ser 0.74  0.50 - 1.35 mg/dL   Calcium 8.7  8.4 - 10.5 mg/dL   GFR calc non Af Amer >90  >90 mL/min   GFR calc Af Amer >90  >90 mL/min  Comment: (NOTE)     The eGFR has been calculated using the CKD EPI equation.     This calculation has not been validated in all clinical situations.     eGFR's persistently <90 mL/min signify possible Chronic Kidney     Disease.  MAGNESIUM     Status: None   Collection Time    12/25/13  5:00 AM      Result Value Ref Range   Magnesium 1.6  1.5 - 2.5 mg/dL  GLUCOSE, CAPILLARY     Status: Abnormal   Collection Time    12/25/13  8:20 AM      Result Value Ref Range   Glucose-Capillary 114 (*) 70 - 99 mg/dL   Comment 1 Documented in Chart     Comment 2 Notify RN    GLUCOSE, CAPILLARY     Status: Abnormal   Collection Time    12/25/13 12:02 PM      Result Value Ref Range   Glucose-Capillary 133 (*) 70 - 99 mg/dL   Comment 1 Documented in Chart     Comment 2 Notify RN    GLUCOSE, CAPILLARY     Status: None   Collection Time    12/25/13  4:45 PM      Result Value Ref Range   Glucose-Capillary 88  70 - 99 mg/dL   Comment 1 Documented in Chart     Comment 2 Notify RN    GLUCOSE, CAPILLARY     Status: None   Collection Time    12/25/13  8:10 PM      Result Value Ref Range   Glucose-Capillary 88  70 - 99 mg/dL   Comment 1 Notify RN    GLUCOSE, CAPILLARY     Status: Abnormal   Collection Time    12/26/13   4:02 AM      Result Value Ref Range   Glucose-Capillary 100 (*) 70 - 99 mg/dL   Comment 1 Notify RN    BASIC METABOLIC PANEL     Status: Abnormal   Collection Time    12/26/13  5:00 AM      Result Value Ref Range   Sodium 143  137 - 147 mEq/L   Potassium 3.8  3.7 - 5.3 mEq/L   Chloride 111  96 - 112 mEq/L   Comment: DELTA CHECK NOTED     REPEATED TO VERIFY   CO2 20  19 - 32 mEq/L   Glucose, Bld 107 (*) 70 - 99 mg/dL   BUN 3 (*) 6 - 23 mg/dL   Creatinine, Ser 0.51  0.50 - 1.35 mg/dL   Calcium 6.5 (*) 8.4 - 10.5 mg/dL   GFR calc non Af Amer >90  >90 mL/min   GFR calc Af Amer >90  >90 mL/min   Comment: (NOTE)     The eGFR has been calculated using the CKD EPI equation.     This calculation has not been validated in all clinical situations.     eGFR's persistently <90 mL/min signify possible Chronic Kidney     Disease.  MAGNESIUM     Status: Abnormal   Collection Time    12/26/13  5:00 AM      Result Value Ref Range   Magnesium 1.2 (*) 1.5 - 2.5 mg/dL    Imaging / Studies: Ir Gastrostomy Tube Mod Sed  12/26/2013   CLINICAL DATA:  53 year old male with metastatic adenocarcinoma, peritoneal carcinomatosis and chronic gastric outlet/ upper small bowel obstruction. A palliative  preventing gastrostomy tube is warranted for patient comfort.  EXAM: PERC PLACEMENT GASTROSTOMY  Date: 12/26/2013  PROCEDURE: 1. Fluoroscopically guided placement of percutaneous pull-through gastrostomy tube. Interventional Radiologist:  Criselda Peaches, MD  ANESTHESIA/SEDATION: Moderate (conscious) sedation was used. Four mg Versed, 200 mcg Fentanyl were administered intravenously. The patient's vital signs were monitored continuously by radiology nursing throughout the procedure. Additionally, 1 mg glucagon was administered intravenously.  Sedation Time: 20 minutes  FLUOROSCOPY TIME:  2 min 6 seconds  CONTRAST:  10 mL Omnipaque administered into the stomach  MEDICATIONS: 1 g vancomycin was administered  intravenously within 1 hour of skin incision.  TECHNIQUE: Informed consent was obtained from the patient following explanation of the procedure, risks, benefits and alternatives. The patient understands, agrees and consents for the procedure. All questions were addressed. A time out was performed.  Maximal barrier sterile technique utilized including caps, mask, sterile gowns, sterile gloves, large sterile drape, hand hygiene, and chlorhexadine skin prep.  An angled catheter was advanced over a wire under fluoroscopic guidance through the nose, down the esophagus and into the body of the stomach. The stomach was then insufflated with several 100 ml of air. Fluoroscopy confirmed location of the gastric bubble, as well as inferior displacement of the barium stained colon. Under direct fluoroscopic guidance, a single T-tack was placed, and the anterior gastric wall drawn up against the anterior abdominal wall. Percutaneous access was then obtained into the mid gastric body with an 18 gauge sheath needle. Aspiration of air, and injection of contrast material under fluoroscopy confirmed needle placement.  An Amplatz wire was advanced in the gastric body and the access needle exchanged for a 9-French vascular sheath. A snare device was advanced through the vascular sheath and an Amplatz wire advanced through the angled catheter. The Amplatz wire was successfully snared and this was pulled up through the esophagus and out the mouth. A 20-French Alinda Dooms MIC-PEG tube was then connected to the snare and pulled through the mouth, down the esophagus, into the stomach and out to the anterior abdominal wall. Hand injection of contrast material confirmed intragastric location. The T-tack retention suture was then cut. The pull through peg tube was then secured with the external bumper and capped.  The patient will be observed for several hours with the newly placed tube on low wall suction to evaluate for any post procedure  complication. The patient tolerated the procedure well, there is no immediate complication.  IMPRESSION: Successful placement of a 20 French pull through gastrostomy tube.  Signed,  Criselda Peaches, MD  [facility]  [facility]   Electronically Signed   By: Jacqulynn Cadet M.D.   On: 12/26/2013 07:07   Dg Abd 2 Views  12/24/2013   CLINICAL DATA:  Small bowel obstruction.  EXAM: ABDOMEN - 2 VIEW  COMPARISON:  12/22/2013  FINDINGS: NG tube is present with the tip in the proximal stomach. Decreasing gaseous distention of the small bowel. Mild distention persists. Gas within nondistended colon. Prior cholecystectomy. No organomegaly or free air.  Visualized lung fields are clear.  IMPRESSION: Improving small bowel obstruction pattern.   Electronically Signed   By: Rolm Baptise M.D.   On: 12/24/2013 09:00    Scheduled Meds: . enoxaparin (LOVENOX) injection  40 mg Subcutaneous QHS  . fentaNYL  75 mcg Transdermal Q72H  . insulin aspart  0-9 Units Subcutaneous 6 times per day  . nicotine  21 mg Transdermal Daily  . pantoprazole (PROTONIX)  IV  40 mg Intravenous QHS   Continuous Infusions: . dextrose 5 % and 0.9% NaCl 100 mL/hr at 12/25/13 1440   PRN Meds:.acetaminophen, acetaminophen, albuterol, HYDROmorphone (DILAUDID) injection, LORazepam, ondansetron (ZOFRAN) IV, oxyCODONE, phenol   Antibiotics: Anti-infectives   Start     Dose/Rate Route Frequency Ordered Stop   12/25/13 1100  vancomycin (VANCOCIN) IVPB 1000 mg/200 mL premix     1,000 mg 200 mL/hr over 60 Minutes Intravenous  Once 12/24/13 1020 12/25/13 1009   12/24/13 1400  vancomycin (VANCOCIN) IVPB 1000 mg/200 mL premix  Status:  Discontinued     1,000 mg 200 mL/hr over 60 Minutes Intravenous  Once 12/24/13 0716 12/24/13 1020      Assessment/Plan  Carcinomatosis  Recurrent small bowel obstruction  -not a surgical candidate  -s/p G tube placement by IR -obtain films and possibly  start on clears if stable -mobilize -pan control per Dr. Kerrin Champagne, [facility] Pager [phone] Office [phone]  12/26/2013 7:43 AM

## 2013-12-26 NOTE — Telephone Encounter (Signed)
FMLA paper given to Managed Care Department 12/26/13.

## 2013-12-26 NOTE — Plan of Care (Signed)
Problem: Phase I Progression Outcomes Goal: Pain controlled with appropriate interventions Outcome: Progressing Patient requesting prn Dilaudid q 2-4 hours for pain around G tube site. Refused oxycodone. Stating he had "tired" it and it really did not help.

## 2013-12-26 NOTE — Care Management Note (Signed)
    Page 1 of 1   12/26/2013     3:16:14 PM CARE MANAGEMENT NOTE 12/26/2013  Patient:  Frank Baird, Frank Baird   Account Number:  [id number]  Date Initiated:  12/26/2013  Documentation initiated by:  Dessa Phi  Subjective/Objective Assessment:   53 Y/O M ADMITTED W/SBO.     Action/Plan:   FROM HOME.   Anticipated DC Date:  12/29/2013   Anticipated DC Plan:  [place]  CM consult      Choice offered to / List presented to:             Status of service:  In process, will continue to follow Medicare Important Message given?   (If response is "NO", the following Medicare IM given date fields will be blank) Date Medicare IM given:   Date Additional Medicare IM given:    Discharge Disposition:    Per UR Regulation:  Reviewed for med. necessity/level of care/duration of stay  If discussed at [place] of Stay Meetings, dates discussed:    Comments:  12/26/13 KATHY MAHABIR RN,BSN NCM [email or web address] FOR PROGRESS & D/C NEEDS.

## 2013-12-26 NOTE — Progress Notes (Signed)
INPATIENT PROGRESS NOTE  Subjective:  Mr. Dissinger underwent placement of a gastrostomy tube yesterday. He has pain at the gastrostomy tube site. He had a small bowel movement yesterday morning. No nausea or vomiting.  Oncological History:  1. 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 CT of the chest 11/18/2012 with borderline enlarged mediastinal nodes, gastrohepatic ligament nodes and omental/peritoneal nodularity. Negative upper endoscopy. Normal CEA on 12/13/2012. Status post cycle 1 FOLFOX 12/11/2012. Status post cycle 2 FOLFOX 12/25/2012. Status post cycle 3 on 01/15/2013. Cycle 4 on 01/29/2013; cycle 5 on 02/12/2013; cycle 6 on 02/26/2013. Restaging CT evaluation showed improvement. He completed cycle 8 on 03/26/2013. Oxaliplatin was discontinued following cycle 8 due to progressive neuropathy symptoms. He began maintenance Xeloda 04/24/2013. Xeloda dose reduced beginning with cycle 5 on 07/26/2013.  CA 19-9 increased at 297.4 on 09/01/2013  CTs 09/01/2013 with decreased chest and abdomen lymphadenopathy, increased omental nodularity  Cycle 1 of FOLFIRI 09/11/2013.  Cycle 2 FOLFIRI 10/09/2013.  Cycle 3 FOLFIRI 10/23/2013  Cycle 4 FOLFIRI 11/25/2013 2. Cecal/appendix mass noted at the time of exploratory laparotomy 11/13/2012. 3. COPD. 4. Pain secondary to carcinomatosis. 5. Chronic back pain. Stable.  6. Status post Port-A-Cath placement 12/02/2012. 7. Delayed nausea following cycle 1 FOLFOX. Aloxi was added with cycle 2. He had delayed nausea following cycle 2. Emend was added beginning with cycle 3. Prophylactic Decadron added beginning with cycle 8. He did not take as prescribed. He again had delayed nausea. 8. Diarrhea following cycle 2 FOLFOX. Question related to 5-fluorouracil. The diarrhea improved with Lomotil. The 5-fluorouracil  bolus was eliminated and the dose of the 5 fluorouracil infusion was reduced beginning with cycle 3.  9. Left mid back pain/tenderness just lateral to the spine. Question etiology. Duragesic patch increased to 75 mcg every 3 days. He takes oxycodone as needed. He continues to have back pain. 10. Anorexia/weight loss. He began a trial of Megace following office visit 01/08/2013. Appetite became better at the time. No longer taking Megace.  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 Increased with moderate loss of vibratory sense at the fingertips per tuning fork exam and interfering with activity when here 04/09/2013. Oxaliplatin was discontinued. Improved. 12. Nausea/vomiting with cycle 1 Xeloda. He has persistent nausea while on Xeloda. Improved with dose reduction beginning with cycle 5. 13. Partial small bowel obstruction confirmed on plain x-ray 12/10/2013 and CT 12/11/2013 after presenting with frequent belching and abdominal bloating   Scheduled Meds: . enoxaparin (LOVENOX) injection  40 mg Subcutaneous QHS  . fentaNYL  75 mcg Transdermal Q72H  . insulin aspart  0-9 Units Subcutaneous Q8H  . magnesium sulfate 1 - 4 g bolus IVPB  2 g Intravenous Once  . nicotine  21 mg Transdermal Daily  . pantoprazole (PROTONIX) IV  40 mg Intravenous QHS   Continuous Infusions:   PRN Meds:.acetaminophen, acetaminophen, albuterol, HYDROmorphone (DILAUDID) injection, LORazepam, ondansetron (ZOFRAN) IV, oxyCODONE, phenol   Objective: Vital signs in last 24 hours: Blood pressure 148/72, pulse 57, temperature 98 F (36.7 C), temperature source Oral, resp. rate 16, height 5\' 10"  (1.778 m), weight 174 lb (78.926 kg), SpO2 98.00%.  Intake/Output from previous day: 06/25 0701 - 06/26 0700 In: 646 [I.V.:646] Out: 1100 [Urine:1100]  Physical Exam: Abdomen-soft, palpable fullness in the inferior to the  umbilicus Lungs-clear bilaterally Cardiac-regular rate and rhythm  Lab Results:  Recent Labs  12/24/13 0410  WBC 8.8  HGB 12.9*  HCT 37.6*  PLT 187    BMET  Recent Labs  12/25/13 0500 12/26/13 0500  NA 141 143  K 3.7 3.8  CL 102 111  CO2 24 20  GLUCOSE 98 107*  BUN 7 3*  CREATININE 0.74 0.51  CALCIUM 8.7 6.5*    Studies/Results: Dg Abd Acute W/chest  12/22/2013   CLINICAL DATA:  Abdominal pain, nausea.  EXAM: ACUTE ABDOMEN SERIES (ABDOMEN 2 VIEW & CHEST 1 VIEW)  COMPARISON:  12/12/2013.  FINDINGS: Left Port-A-Cath is in place with the tip in the SVC. Heart is normal size. Lungs are clear. No effusions.  Dilated small bowel loops with air-fluid levels throughout the abdomen, worsening since prior study. Decreasing colonic gas. No free air organomegaly. Prior cholecystectomy.  IMPRESSION: Worsening small bowel distention and air-fluid levels compatible with worsening small bowel obstruction.  No acute cardiopulmonary disease.   Electronically Signed   By: Rolm Baptise M.D.   On: 12/22/2013 16:04     Assessment/Plan:  1.metastatic adenocarcinoma with abdominal carcinomatosis s/p Folfiri , last given 11/25/2013  2.worsening small bowel obstruction confirmed by plain x ray on 6/22 during this admission,, status post NG tube placement  Status post placement of a palliative gastrostomy tube 12/25/2013   3. Abdominal Pain, secondary to #1 and the gastrostomy tube placement   continue narcotic analgesics for pain   4. Anemia, multifactorial    5. Malnutrition due to #1   Mr. Caligiuri underwent placement of a palliative gastrostomy tube yesterday. He now has increased pain at the gastrostomy tube site. Hopefully he can maintain his hydration and nutrition with a liquid diet. If not we will consider home TNA.  Recommendations:  1. Continue Duragesic, Dilaudid, and oxycodone for pain 2. Advanced to liquid diet as tolerated 3. Please call oncology over the weekend as  needed. I will see him 12/29/2013 if he remains in the hospital and we will arrange for outpatient followup.       LOS: 4 days   SHERRILL, Dominica Severin  12/26/2013, 2:24 PM

## 2013-12-27 DIAGNOSIS — C189 Malignant neoplasm of colon, unspecified: Secondary | ICD-10-CM

## 2013-12-27 LAB — BASIC METABOLIC PANEL
BUN: 4 mg/dL — AB (ref 6–23)
CO2: 25 meq/L (ref 19–32)
Calcium: 9 mg/dL (ref 8.4–10.5)
Chloride: 99 mEq/L (ref 96–112)
Creatinine, Ser: 0.66 mg/dL (ref 0.50–1.35)
GFR calc Af Amer: >90 mL/min (ref >90)
GFR calc non Af Amer: >90 mL/min (ref >90)
GLUCOSE: 103 mg/dL — AB (ref 70–99)
Potassium: 3.7 mEq/L (ref 3.7–5.3)
SODIUM: 136 meq/L — AB (ref 137–147)

## 2013-12-27 LAB — MAGNESIUM: Magnesium: 1.7 mg/dL (ref 1.5–2.5)

## 2013-12-27 LAB — GLUCOSE, CAPILLARY
GLUCOSE-CAPILLARY: 104 mg/dL — AB (ref 70–99)
Glucose-Capillary: 138 mg/dL — ABNORMAL HIGH (ref 70–99)
Glucose-Capillary: 91 mg/dL (ref 70–99)

## 2013-12-27 LAB — ALBUMIN: Albumin: 3.2 g/dL — ABNORMAL LOW (ref 3.5–5.2)

## 2013-12-27 MED ORDER — BACITRACIN-NEOMYCIN-POLYMYXIN 400-5-5000 EX OINT
TOPICAL_OINTMENT | Freq: Every day | CUTANEOUS | Status: DC
  Administered 2013-12-27 – 2013-12-29 (×2): via TOPICAL
  Filled 2013-12-27: qty 1

## 2013-12-27 MED ORDER — ENSURE COMPLETE PO LIQD
237.0000 mL | Freq: Three times a day (TID) | ORAL | Status: DC
  Administered 2013-12-27 – 2013-12-30 (×5): 237 mL via ORAL

## 2013-12-27 NOTE — Progress Notes (Signed)
Patient ID: Frank Baird, male   DOB: [birth year], 53 y.o.   MRN: [id number] [facility] PROGRESS NOTE  Frank Baird JXB:[id number] DOB: Feb 25, 1961 DOA: 12/22/2013 PCP: Odette Fraction, MD   Assessment/Plan: SBO - continue conservative management. KUB 6-24 Improving small bowel obstruction pattern.  - provide analgesia and antiemetics as needed - appreciate surgery and oncology input  -S/P G tube placement 6-25. -He had small BM on 6-25.  -complaining of cramps after G tube placement.  -started on clear diet.  -still with pain, requiring IV pain medications.  -Had to vent G tube yesterday.   Abdominal carcinomatosis  - with multiple biopsies on 11/13/2012 confirming metastatic adenocarcinoma, immunophenotype most consistent with an upper gastrointestinal or pancreaticobiliary primary  Hypoglycemia; started on clear diet. Will DC fluids and monitor. Resolved.   History of diabetes; holding oral medication. Patient NPO. Suspect he wont medications at discharge.   COPD - clinically compensated at this time - oxygen saturations at target range - provide nicotine patch   Hypokalemia; potasium normal range today.  - in the setting of hypomagnesemia -repeat labs in am.   Pain secondary to carcinomatosis. - analgesia as needed   Severe malnutrition - secondary to progressive carcinomatosis - pt lost 7 lbs since last admission   DVT prophylaxis: Lovenox   Code Status: full code  Family Communication: plan of care discussed with the patient Disposition Plan: home when stable   [name], MD  [facility] Pager [phone]  If 7PM-7AM, please contact night-coverage [email or web address] Password [email or web address] 12/27/2013, 1:30 PM   LOS: 5 days   Consultants:  Oncology  Surgery  Procedures:  None  Antibiotics:  None  HPI/Subjective: Still with  abdominal pain,. Passing gas.   Objective: Filed Vitals:   12/26/13 1340 12/26/13 2030 12/26/13 2154  12/27/13 0524  BP: 148/72  154/76 148/87  Pulse: 57 58 59 63  Temp: 98 F (36.7 C)  98.7 F (37.1 C) 98.5 F (36.9 C)  TempSrc: Oral  Oral Oral  Resp: 16  18 18   Height:      Weight:      SpO2: 98%  98% 99%    Intake/Output Summary (Last 24 hours) at 12/27/13 1330 Last data filed at 12/27/13 0715  Gross per 24 hour  Intake  999.2 ml  Output    850 ml  Net  149.2 ml    Exam:   General:  Pt is alert, follows commands appropriately, not in acute distress  Cardiovascular: Regular rate and rhythm, S1/S2, no murmurs  Respiratory: Clear to auscultation bilaterally, no wheezing, no crackles, no rhonchi  Abdomen: Soft, tender, distended, G tube in placed.   Extremities: No edema, pulses DP and PT palpable bilaterally   Data Reviewed: Basic Metabolic Panel:  Recent Labs Lab 12/22/13 1542 12/23/13 0555 12/24/13 0410 12/25/13 0500 12/26/13 0500 12/27/13 0655  NA 137 142 143 141 143 136*  K 4.1 3.5* 3.2* 3.7 3.8 3.7  CL 98 103 101 102 111 99  CO2 24 27 25 24 20 25   GLUCOSE 117* 91 76 98 107* 103*  BUN 8 7 8 7  3* 4*  CREATININE 0.90 0.84 0.78 0.74 0.51 0.66  CALCIUM 9.4 8.5 8.9 8.7 6.5* 9.0  MG  --  1.1* 1.9 1.6 1.2* 1.7  PHOS  --  4.3  --   --   --   --    Liver Function Tests:  Recent Labs Lab 12/22/13 1542 12/23/13 0555 12/27/13 0655  AST 40*  31  --   ALT 36 31  --   ALKPHOS 96 84  --   BILITOT 0.6 0.8  --   PROT 7.3 6.1  --   ALBUMIN 3.8 3.2* 3.2*    Recent Labs Lab 12/22/13 1542  LIPASE 18    CBC:  Recent Labs Lab 12/22/13 1542 12/23/13 0555 12/24/13 0410  WBC 10.3 6.7 8.8  NEUTROABS 7.1  --   --   HGB 14.4 12.8* 12.9*  HCT 42.9 38.1* 37.6*  MCV 87.4 87.4 87.0  PLT 237 171 187   CBG:  Recent Labs Lab 12/26/13 0402 12/26/13 0746 12/26/13 1621 12/26/13 2321 12/27/13 0747  GLUCAP 100* 126* 157* 101* 104*    No results found for this or any previous visit (from the past 240 hour(s)).   Studies: Dg Abd 2 Views  12/26/2013    CLINICAL DATA:  SBO.  EXAM: ABDOMEN - 2 VIEW  COMPARISON:  12/12/2013 and 12/24/2013  FINDINGS: Interval removal nasogastric tube. Placement of percutaneous gastrostomy tube with tip overlying the stomach in the left upper quadrant. Examination demonstrates persistence of air-filled mildly dilated small bowel loops in the left abdomen greater than right without significant change. Air and stool are present throughout the colon. No evidence of free peritoneal air. A few scattered air-fluid levels are present. Remainder the exam is unchanged.  IMPRESSION: No change in several mildly dilated small bowel loops with air-fluid levels likely representing a partial small bowel obstruction.  Gastrostomy tube appears in adequate position over the left upper quadrant.   Electronically Signed   By: Marin Olp M.D.   On: 12/26/2013 08:39    Scheduled Meds: . enoxaparin (LOVENOX) injection  40 mg Subcutaneous QHS  . fentaNYL  75 mcg Transdermal Q72H  . insulin aspart  0-9 Units Subcutaneous Q8H  . neomycin-bacitracin-polymyxin   Topical Daily  . nicotine  21 mg Transdermal Daily  . pantoprazole (PROTONIX) IV  40 mg Intravenous QHS   Continuous Infusions:

## 2013-12-28 LAB — BASIC METABOLIC PANEL
BUN: 5 mg/dL — AB (ref 6–23)
CO2: 27 meq/L (ref 19–32)
CREATININE: 0.73 mg/dL (ref 0.50–1.35)
Calcium: 9.1 mg/dL (ref 8.4–10.5)
Chloride: 98 mEq/L (ref 96–112)
GFR calc Af Amer: >90 mL/min (ref >90)
GFR calc non Af Amer: >90 mL/min (ref >90)
Glucose, Bld: 84 mg/dL (ref 70–99)
Potassium: 3.7 mEq/L (ref 3.7–5.3)
Sodium: 139 mEq/L (ref 137–147)

## 2013-12-28 LAB — GLUCOSE, CAPILLARY
GLUCOSE-CAPILLARY: 134 mg/dL — AB (ref 70–99)
Glucose-Capillary: 88 mg/dL (ref 70–99)

## 2013-12-28 LAB — MAGNESIUM: Magnesium: 1.6 mg/dL (ref 1.5–2.5)

## 2013-12-28 NOTE — Progress Notes (Signed)
Patient ID: Frank Baird, male   DOB: 1960-12-20, 53 y.o.   MRN: [id number] [facility] PROGRESS NOTE  Frank Baird MPN:[id number] DOB: October 27, 1960 DOA: 12/22/2013 PCP: Odette Fraction, MD   Assessment/Plan: SBO - continue conservative management. KUB 6-24 Improving small bowel obstruction pattern.  - provide analgesia and antiemetics as needed - appreciate surgery and oncology input  -S/P G tube placement 6-25. -He had small BM on 6-25.  -complaining of cramps after G tube placement.  -started on clear diet.  -still with pain, requiring less  IV pain medications.  -Had to vent G tube yesterday again.  -will repeat Albumin in am.   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  Hypoglycemia; Received D 5 fluids initially. Resolved.   History of diabetes; holding oral medication. Patient NPO. Suspect he wont medications at discharge.   COPD - clinically compensated at this time - oxygen saturations at target range - provide nicotine patch   Hypokalemia; potasium normal range today.  - in the setting of hypomagnesemia -repeat labs in am.   Pain secondary to carcinomatosis. - analgesia as needed   Severe malnutrition - secondary to progressive carcinomatosis - pt lost 7 lbs since last admission   DVT prophylaxis: Lovenox   Code Status: full code  Family Communication: plan of care discussed with the patient Disposition Plan: home when stable   [name], MD  [facility] Pager [phone]  If 7PM-7AM, please contact night-coverage [email or web address] Password [email or web address] 12/28/2013, 2:44 PM   LOS: 6 days   Consultants:  Oncology  Surgery  Procedures:  None  Antibiotics:  None  HPI/Subjective: Still with  abdominal pain,. Passing gas. He open the G tube yesterday night, was feeling  bloating.   Objective: Filed Vitals:   12/27/13 1355 12/27/13 2040 12/28/13 0510 12/28/13 1356  BP: 152/81 162/80 153/81 146/74  Pulse: 57 61 57 53  Temp: 98.7 F (37.1 C) 98.3 F (36.8 C) 98.1 F (36.7 C) 98.8 F (37.1 C)  TempSrc: Oral Oral Oral Oral  Resp: 18 18 16 18   Height:      Weight:      SpO2: 98% 98% 93% 98%    Intake/Output Summary (Last 24 hours) at 12/28/13 1444 Last data filed at 12/28/13 1357  Gross per 24 hour  Intake   1300 ml  Output   1850 ml  Net   -550 ml    Exam:   General:  Pt is alert, follows commands appropriately, not in acute distress  Cardiovascular: Regular rate and rhythm, S1/S2, no murmurs  Respiratory: Clear to auscultation bilaterally, no wheezing, no crackles, no rhonchi  Abdomen: Soft, tender, distended, G tube in placed.   Extremities: No edema, pulses DP and PT palpable bilaterally   Data Reviewed: Basic Metabolic Panel:  Recent Labs Lab 12/22/13 1542  12/23/13 0555 12/24/13 0410 12/25/13 0500 12/26/13 0500 12/27/13 0655 12/28/13 0508  NA 137  --  142 143 141 143 136* 139  K 4.1  --  3.5* 3.2* 3.7 3.8 3.7 3.7  CL 98  --  103 101 102 111 99 98  CO2 24  --  27 25 24 20 25 27   GLUCOSE 117*  --  91 76 98 107* 103* 84  BUN 8  --  7 8 7  3* 4* 5*  CREATININE 0.90  --  0.84 0.78 0.74 0.51 0.66 0.73  CALCIUM 9.4  --  8.5 8.9 8.7 6.5* 9.0 9.1  MG  --   < > 1.1* 1.9 1.6 1.2* 1.7 1.6  PHOS  --   --  4.3  --   --   --   --   --   < > = values in this interval not displayed. Liver Function Tests:  Recent Labs Lab 12/22/13 1542 12/23/13 0555 12/27/13 0655  AST 40* 31  --   ALT 36 31  --   ALKPHOS 96 84  --   BILITOT 0.6 0.8  --   PROT 7.3 6.1  --   ALBUMIN 3.8 3.2* 3.2*    Recent Labs Lab 12/22/13 1542  LIPASE 18    CBC:  Recent Labs Lab 12/22/13 1542 12/23/13 0555 12/24/13 0410  WBC 10.3 6.7 8.8  NEUTROABS 7.1  --   --   HGB 14.4 12.8* 12.9*  HCT 42.9 38.1* 37.6*  MCV 87.4 87.4 87.0  PLT 237 171  187   CBG:  Recent Labs Lab 12/26/13 2321 12/27/13 0747 12/27/13 1623 12/27/13 2259 12/28/13 0758  GLUCAP 101* 104* 138* 91 88    No results found for this or any previous visit (from the past 240 hour(s)).   Studies: No results found.  Scheduled Meds: . enoxaparin (LOVENOX) injection  40 mg Subcutaneous QHS  . feeding supplement (ENSURE COMPLETE)  237 mL Oral TID WC  . fentaNYL  75 mcg Transdermal Q72H  . insulin aspart  0-9 Units Subcutaneous Q8H  . neomycin-bacitracin-polymyxin   Topical Daily  . nicotine  21 mg Transdermal Daily  . pantoprazole (PROTONIX) IV  40 mg Intravenous QHS   Continuous Infusions:

## 2013-12-29 DIAGNOSIS — Z931 Gastrostomy status: Secondary | ICD-10-CM

## 2013-12-29 LAB — GLUCOSE, CAPILLARY
GLUCOSE-CAPILLARY: 93 mg/dL (ref 70–99)
Glucose-Capillary: 135 mg/dL — ABNORMAL HIGH (ref 70–99)
Glucose-Capillary: 150 mg/dL — ABNORMAL HIGH (ref 70–99)

## 2013-12-29 LAB — BASIC METABOLIC PANEL
BUN: 5 mg/dL — ABNORMAL LOW (ref 6–23)
CO2: 27 mEq/L (ref 19–32)
Calcium: 9.2 mg/dL (ref 8.4–10.5)
Chloride: 98 mEq/L (ref 96–112)
Creatinine, Ser: 0.74 mg/dL (ref 0.50–1.35)
GLUCOSE: 81 mg/dL (ref 70–99)
Potassium: 3.5 mEq/L — ABNORMAL LOW (ref 3.7–5.3)
Sodium: 140 mEq/L (ref 137–147)

## 2013-12-29 LAB — ALBUMIN: Albumin: 3.3 g/dL — ABNORMAL LOW (ref 3.5–5.2)

## 2013-12-29 MED ORDER — PANTOPRAZOLE SODIUM 40 MG PO TBEC
40.0000 mg | DELAYED_RELEASE_TABLET | Freq: Two times a day (BID) | ORAL | Status: DC
  Administered 2013-12-29 – 2013-12-30 (×3): 40 mg via ORAL
  Filled 2013-12-29 (×4): qty 1

## 2013-12-29 MED ORDER — POTASSIUM CHLORIDE CRYS ER 20 MEQ PO TBCR
40.0000 meq | EXTENDED_RELEASE_TABLET | Freq: Once | ORAL | Status: AC
  Administered 2013-12-29: 40 meq via ORAL
  Filled 2013-12-29: qty 2

## 2013-12-29 NOTE — Progress Notes (Signed)
Patient ID: Frank Baird, male   DOB: 1961/03/18, 53 y.o.   MRN: [id number]  General Surgery - [facility] - Progress Note  Subjective: Patient up in chair, no complaints this AM.  Denies abdominal pain.  Tolerating clear liquid diet.  Objective: Vital signs in last 24 hours: Temp:  [98.4 F (36.9 C)-98.8 F (37.1 C)] 98.4 F (36.9 C) (06/29 0537) Pulse Rate:  [53-64] 64 (06/29 0537) Resp:  [18] 18 (06/29 0537) BP: (142-153)/(74-85) 153/85 mmHg (06/29 0537) SpO2:  [97 %-98 %] 97 % (06/29 0537) Last BM Date: 12/26/13  Intake/Output from previous day: 06/28 0701 - 06/29 0700 In: 960 [P.O.:960] Out: 1175 [Urine:1175]  Exam: HEENT - clear, not icteric Neck - soft Chest - clear bilaterally Cor - RRR, no murmur Abd - slight distension; G-tube LUQ with dry dressing; mild abd tenderness Ext - no significant edema Neuro - grossly intact, no focal deficits  Lab Results:  No results found for this basename: WBC, HGB, HCT, PLT,  in the last 72 hours   Recent Labs  12/28/13 0508 12/29/13 0552  NA 139 140  K 3.7 3.5*  CL 98 98  CO2 27 27  GLUCOSE 84 81  BUN 5* 5*  CREATININE 0.73 0.74  CALCIUM 9.1 9.2    Studies/Results: No results found.  Assessment / Plan: 1.  Carcinomatosis  G-tube in place and functioning normally  Good symptomatic relief with G-tube  Clear liquid diet  Home 1-2 days per medical service  Will sign off - call if surgical service needed  Earnstine Regal, MD, [facility] Office: [phone]  12/29/2013

## 2013-12-29 NOTE — Progress Notes (Signed)
Patient ID: Frank Baird, male   DOB: [birth year], 53 y.o.   MRN: [id number] [facility] PROGRESS NOTE  AMY BELLOSO JQZ:[id number] DOB: [birth year] DOA: 12/22/2013 PCP: Odette Fraction, MD   Assessment/Plan: SBO - continue conservative management. KUB 6-24 Improving small bowel obstruction pattern.  - provide analgesia and antiemetics as needed - appreciate surgery and oncology input  -S/P G tube placement 6-25. -He had small BM on 6-25.  -complaining of cramps after G tube placement.  -still with pain, requiring less  IV pain medications.  -Had to vent G tube yesterday again.  -Advance diet to full liquid diet.   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  Hypoglycemia; Received D 5 fluids initially. Resolved.   History of diabetes; holding oral medication. Patient NPO. Suspect he wont medications at discharge.   COPD - clinically compensated at this time - oxygen saturations at target range - provide nicotine patch   Hypokalemia; potasium normal range today.  - in the setting of hypomagnesemia -repeat labs in am.   Pain secondary to carcinomatosis. - analgesia as needed   Severe malnutrition - secondary to progressive carcinomatosis - pt lost 7 lbs since last admission   DVT prophylaxis: Lovenox   Code Status: full code  Family Communication: plan of care discussed with the patient Disposition Plan: home 6-30  Frank Shiley, MD  [facility] Pager [phone]  If 7PM-7AM, please contact night-coverage [email or web address] Password [email or web address] 12/29/2013, 4:39 PM   LOS: 7 days   Consultants:  Oncology  Surgery  Procedures:  None  Antibiotics:  None  HPI/Subjective: Still with  abdominal pain but better,. Passing gas. He open the G tube yesterday night.  Objective: Filed Vitals:   12/28/13 1356 12/28/13 2031 12/29/13 0537 12/29/13 1447  BP: 146/74 142/79 153/85 128/74  Pulse: 53 56 64 90  Temp: 98.8 F (37.1 C) 98.8 F (37.1 C) 98.4 F (36.9 C) 98.6 F (37 C)  TempSrc: Oral Oral Oral Oral  Resp: 18 18 18 18   Height:      Weight:      SpO2: 98% 97% 97% 97%    Intake/Output Summary (Last 24 hours) at 12/29/13 1639 Last data filed at 12/29/13 1450  Gross per 24 hour  Intake    240 ml  Output    600 ml  Net   -360 ml    Exam:   General:  Pt is alert, follows commands appropriately, not in acute distress  Cardiovascular: Regular rate and rhythm, S1/S2, no murmurs  Respiratory: Clear to auscultation bilaterally, no wheezing, no crackles, no rhonchi  Abdomen: Soft, tender, distended, G tube in placed.   Extremities: No edema, pulses DP and PT palpable bilaterally   Data Reviewed: Basic Metabolic Panel:  Recent Labs Lab 12/23/13 0555 12/24/13 0410 12/25/13 0500 12/26/13 0500 12/27/13 0655 12/28/13 0508 12/29/13 0552  NA 142 143 141 143 136* 139 140  K 3.5* 3.2* 3.7 3.8 3.7 3.7 3.5*  CL 103 101 102 111 99 98 98  CO2 27 25 24 20 25 27 27   GLUCOSE 91 76 98 107* 103* 84 81  BUN 7 8 7  3* 4* 5* 5*  CREATININE 0.84 0.78 0.74 0.51 0.66 0.73 0.74  CALCIUM 8.5 8.9 8.7 6.5* 9.0 9.1 9.2  MG 1.1* 1.9 1.6 1.2* 1.7 1.6  --   PHOS 4.3  --   --   --   --   --   --  Liver Function Tests:  Recent Labs Lab 12/23/13 0555 12/27/13 0655 12/29/13 0552  AST 31  --   --   ALT 31  --   --   ALKPHOS 84  --   --   BILITOT 0.8  --   --   PROT 6.1  --   --   ALBUMIN 3.2* 3.2* 3.3*   No results found for this basename: LIPASE, AMYLASE,  in the last 168 hours  CBC:  Recent Labs Lab 12/23/13 0555 12/24/13 0410  WBC 6.7 8.8  HGB 12.8* 12.9*  HCT 38.1* 37.6*  MCV 87.4 87.0  PLT 171 187   CBG:  Recent Labs Lab 12/27/13 2259 12/28/13 0758 12/28/13 1556 12/29/13 0053 12/29/13 0958  GLUCAP 91 88 134* 93 135*    No results found for this or  any previous visit (from the past 240 hour(s)).   Studies: No results found.  Scheduled Meds: . enoxaparin (LOVENOX) injection  40 mg Subcutaneous QHS  . feeding supplement (ENSURE COMPLETE)  237 mL Oral TID WC  . fentaNYL  75 mcg Transdermal Q72H  . insulin aspart  0-9 Units Subcutaneous Q8H  . neomycin-bacitracin-polymyxin   Topical Daily  . nicotine  21 mg Transdermal Daily  . pantoprazole  40 mg Oral BID  . potassium chloride  40 mEq Oral Once   Continuous Infusions:

## 2013-12-29 NOTE — Progress Notes (Signed)
INPATIENT PROGRESS NOTE  Subjective:  Events since 6/26 noted. Patient reports tolerating liquids. G tube utilized without difficulty, with minimal residual. Abdominal pain with palpation. No bowel movement since 6/25. Passing flatus. No urinary complaints. No cardiac or respiratory issues. Ambulating without difficulty. No confusion.    Scheduled Meds: . enoxaparin (LOVENOX) injection  40 mg Subcutaneous QHS  . feeding supplement (ENSURE COMPLETE)  237 mL Oral TID WC  . fentaNYL  75 mcg Transdermal Q72H  . insulin aspart  0-9 Units Subcutaneous Q8H  . neomycin-bacitracin-polymyxin   Topical Daily  . nicotine  21 mg Transdermal Daily  . pantoprazole (PROTONIX) IV  40 mg Intravenous QHS   Continuous Infusions:   PRN Meds:.acetaminophen, acetaminophen, albuterol, HYDROmorphone (DILAUDID) injection, LORazepam, ondansetron (ZOFRAN) IV, oxyCODONE, phenol   Objective: Vital signs in last 24 hours: Blood pressure 153/85, pulse 64, temperature 98.4 F (36.9 C), temperature source Oral, resp. rate 18, height 5\' 10"  (1.778 m), weight 174 lb (78.926 kg), SpO2 97.00%.  Intake/Output from previous day: 06/28 0701 - 06/29 0700 In: 960 [P.O.:960] Out: 1175 [Urine:1175]  Physical Exam: Abdomen-soft,tender around the right lower quadrant and less tender diffusely. Positive bowel sounds. G tube with dry dressing.  Lungs-clear bilaterally Cardiac-regular rate and rhythm  BMET  Recent Labs  12/28/13 0508 12/29/13 0552  NA 139 140  K 3.7 3.5*  CL 98 98  CO2 27 27  GLUCOSE 84 81  BUN 5* 5*  CREATININE 0.73 0.74  CALCIUM 9.1 9.2     Assessment/Plan:  1.metastatic adenocarcinoma with abdominal carcinomatosis s/p Folfiri,  last given 11/25/2013  2 .Worsening small bowel obstruction confirmed by plain x ray on 6/22 during this admission, status post NG tube placement  Status post placement of a palliative gastrostomy tube 12/25/2013   3. Abdominal Pain, secondary to #1 and the  gastrostomy tube placement  continue narcotic analgesics for pain    4. Anemia, multifactorial   5. Malnutrition due to #1 Advance to Full Liquid Diet prior to discharge.    LOS: 7 days   [facility]  12/29/2013, 9:36 AM Frank Baird was interviewed and examined. He will advance to a full liquid diet. He will use the gastrostomy tube for decompression as needed. He can continue Duragesic/oxycodone for pain at discharge.  We will schedule outpatient followup at the [facility].

## 2013-12-30 ENCOUNTER — Encounter: Payer: Self-pay | Admitting: Oncology

## 2013-12-30 DIAGNOSIS — C762 Malignant neoplasm of abdomen: Secondary | ICD-10-CM

## 2013-12-30 LAB — GLUCOSE, CAPILLARY
Glucose-Capillary: 100 mg/dL — ABNORMAL HIGH (ref 70–99)
Glucose-Capillary: 98 mg/dL (ref 70–99)

## 2013-12-30 MED ORDER — FENTANYL 75 MCG/HR TD PT72: 75.0000 ug | MEDICATED_PATCH | TRANSDERMAL | Status: DC

## 2013-12-30 MED ORDER — ENSURE COMPLETE PO LIQD: 237.0000 mL | Freq: Three times a day (TID) | ORAL | Status: DC

## 2013-12-30 MED ORDER — OXYCODONE HCL 20 MG/ML PO CONC: 10.0000 mg | ORAL | Status: DC | PRN

## 2013-12-30 MED ORDER — HEPARIN SOD (PORK) LOCK FLUSH 100 UNIT/ML IV SOLN
500.0000 [IU] | INTRAVENOUS | Status: AC | PRN
  Administered 2013-12-30: 500 [IU]
  Filled 2013-12-30: qty 5
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
## 2013-12-30 NOTE — Discharge Summary (Signed)
Physician Discharge Summary  Frank Baird TIR:[id number] DOB: 1960-11-01 DOA: 12/22/2013  PCP: Odette Fraction, MD  Admit date: 12/22/2013 Discharge date: 12/30/2013  Time spent: 35 minutes  Recommendations for Outpatient Follow-up:  Follow up with Dr Benay Spice, need further evaluation of nutrition status.   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   Diet recommendation: Clear diet.   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 Benay Spice receiving FOLFIRI. Had recent admition for SBO that was treated conservatively. He had a surgical consult done at that time and thought that SBO was due to progressive carcinomatosis with multiple points of intestinal involvement. NO operative intervention was recommended at this point.  Patient presents with 3 day hx of abdominal pain nausea and watery BM. He states although he felt better initially after discharge the belching started back soon there after. Patient recognized symtoms of SBO and attempted to decrease PO intake but continued to have symptoms. He presented to [facility] ER and was found to have SBO. NG was placed and surgical consult was called but recommend further discussion with oncology given heavy tumor burden.  Of note patient has mentioned some right ankle pain that he has attributed to gout that got better with a dose of prednisone  Hospitalist was called for admission for SBO   Hospital Course:  SBO  -Patient was readmitted with SBO secondary to Metastatic adenocarcinoma with abdominal carcinomatosis, he was treataed conservatively. He had NG tube placed. He condition didn't  improved. He underwent  G tube placement 6-25 for palliatiaion. He was tolerating clear diet. He had a Bowel movement prior to discharge. He empty G tube couple of times during this admission to relieved indigestion. He will follow up with Dr Benay Spice to consider TPN.   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  Hypoglycemia; Received D 5 fluids initially. Resolved.  History of diabetes; holding oral medication. Patient NPO. Will continue to hold diabetes medications at discharge. He is only tolerating clear diet,.  COPD  - clinically compensated at this time  - oxygen saturations at target range  - provide nicotine patch  Hypokalemia; potasium normal range today.  - in the setting of hypomagnesemia  -repeat labs in am.  Pain secondary to carcinomatosis.  - analgesia as needed  Severe malnutrition  - secondary to progressive carcinomatosis  - pt lost 7 lbs since last admission  DVT prophylaxis: Lovenox    Procedures:  G Tube 6-25.   Consultations:  Oncology  Surgery  IR  Discharge Exam: Filed Vitals:   12/30/13 0607  BP: 154/81  Pulse: 58  Temp: 98.3 F (36.8 C)  Resp: 18    General: no distress.  Cardiovascular: S 1, S 2 RRR Respiratory: CTA' Abdomen: soft, distended, G tube in place with clean dressing.   Discharge Instructions You were cared for by a hospitalist during your hospital stay. If you have any questions about your discharge medications or the care you received while you were in the hospital after you are discharged, you can call the unit  and asked to speak with the hospitalist on call if the hospitalist that took care of you is not available. Once you are discharged, your primary care physician will handle any further medical issues. Please note that NO REFILLS for any discharge medications will be  authorized once you are discharged, as it is imperative that you return to your primary care physician (or establish a relationship with a primary care physician if you do not have one) for your aftercare needs so that they can reassess your need for medications and monitor your lab values.  Discharge Instructions   Diet general    Complete by:  As directed   Full liquid diet     Increase activity slowly    Complete by:  As directed             Medication List    STOP taking these medications       diphenoxylate-atropine 2.5-0.025 MG per tablet  Commonly known as:  LOMOTIL     HYDROmorphone 4 MG tablet  Commonly known as:  DILAUDID     metoCLOPramide 10 MG tablet  Commonly known as:  REGLAN     Oxycodone HCl 10 MG Tabs  Replaced by:  oxyCODONE 20 MG/ML concentrated solution     pioglitazone 30 MG tablet  Commonly known as:  ACTOS      TAKE these medications       albuterol 108 (90 BASE) MCG/ACT inhaler  Commonly known as:  PROVENTIL HFA;VENTOLIN HFA  Inhale 2 puffs into the lungs every 6 (six) hours as needed for wheezing.     esomeprazole 40 MG capsule  Commonly known as:  NEXIUM  Take 1 capsule (40 mg total) by mouth daily before breakfast.     feeding supplement (ENSURE COMPLETE) Liqd  Take 237 mLs by mouth 3 (three) times daily with meals.     fentaNYL 75 MCG/HR  Commonly known as:  DURAGESIC - dosed mcg/hr  Place 1 patch (75 mcg total) onto the skin every 3 (three) days.     lidocaine-prilocaine cream  Commonly known as:  EMLA  Apply to port a cath site one hour prior to chemo. Do not rub in. Cover with plastic.     lisinopril 20 MG tablet  Commonly known as:  PRINIVIL,ZESTRIL  Take 20 mg by mouth daily.     LORazepam 1 MG tablet  Commonly known as:  ATIVAN  Take 1 mg by mouth every 6 (six) hours as needed for anxiety or sleep.     nicotine 21 mg/24hr patch  Commonly known as:  NICODERM CQ - dosed in mg/24 hours  Place 21 mg onto the skin daily.      ondansetron 8 MG tablet  Commonly known as:  ZOFRAN  Take 1 tablet (8 mg total) by mouth every 8 (eight) hours as needed for nausea.     oxyCODONE 20 MG/ML concentrated solution  Commonly known as:  ROXICODONE INTENSOL  Place 0.5-1 mLs (10-20 mg total) under the tongue every 4 (four) hours as needed for moderate pain or severe pain.       Allergies  Allergen Reactions  . Bee Venom     Unsure of reaction, Stung as baby and almost died  . Codeine     Bad stomach cramps    . Penicillins Other (See Comments)    unknown       Follow-up Information   Follow up with [facility] TOM, MD In 2 weeks.   Specialty:  Family Medicine   Contact information:   [address] [place] [address] [phone]       Follow up with Betsy Coder, MD In 1 week.   Specialty:  Oncology   Contact information:   [place] [address] [phone]        The results of significant diagnostics from this hospitalization (including imaging, microbiology, ancillary and laboratory) are listed below for reference.    Significant Diagnostic Studies: Ct Abdomen Pelvis W Contrast  12/12/2013   CLINICAL DATA:  Several days of abdominal distention, belching, and abdominal pain. History of colon cancer currently on chemotherapy.  EXAM: CT ABDOMEN AND PELVIS WITH CONTRAST  TECHNIQUE: Multidetector CT imaging of the abdomen and pelvis was performed using the standard protocol following bolus administration of intravenous contrast.  CONTRAST:  6mL OMNIPAQUE IOHEXOL 300 MG/ML SOLN, 127mL OMNIPAQUE IOHEXOL 300 MG/ML SOLN  COMPARISON:  09/01/2013  FINDINGS: Mild dependent changes in the lung bases. Right pericardial lymph nodes are mildly enlarged, similar to prior study. Distal paraesophageal lymph nodes are also unchanged.  Free fluid around the liver edge and extending along the right pericolic gutter. This is similar to prior study. Nodular infiltrative changes in the  omentum and mesentery likely represent peritoneal carcinomatosis. Mesenteric changes appear slightly more prominent than on prior study. No focal liver lesions are identified. Surgical absence of the gallbladder. Mild dilatation of bile ducts is probably physiologic after cholecystectomy. Spleen size is normal. No adrenal gland nodules. Kidneys appear symmetrical without mass or hydronephrosis. Inferior vena cava and abdominal aorta are normal allowing for vascular calcifications. Pancreas is unremarkable. Retroperitoneal lymph nodes are not pathologically enlarged. Mesenteric lymph nodes are numerous without pathologic enlargement and probably metastatic. No free air in the abdomen. Nonspecific infiltration in the umbilicus could represent tumor infiltration oral postoperative scarring. This is unchanged. Gastric wall is not thickened. Proximal small bowel are mildly prominent without discrete dilatation. Fluid filled distal small bowel. Partial obstruction or ileus are not excluded. Appearance is similar to prior study. Stool-filled colon without distention.  Pelvis: Prostate gland is not enlarged. Bladder wall is not thickened. Small amount of free fluid in the pelvis. Pelvic lymph nodes are moderately prominent without pathologic enlargement. This is stable. Sigmoid colonic wall thickening demonstrated on prior study is less well visualized today due to degree of distention. No diverticulitis. Appendix is normal. No destructive or expansile bone lesions are appreciated.  IMPRESSION: Changes of likely abdominal and pelvic peritoneal carcinomatosis with small amount of free fluid around the liver and in the pelvis, numerous nonenlarged mesenteric lymph nodes, infiltration and nodularity in the omentum and mesenteric, and with infiltration into the abdominal wall at the umbilicus. Prominent lymph nodes in the lower chest. Mesenteric infiltration appears to be mildly progressed since previous study. Borderline  prominence of small bowel without definitive evidence of obstruction. Changes could be due to partial obstruction or ileus.   Electronically Signed   By: Lucienne Capers M.D.   On: 12/12/2013 02:08   Ir Gastrostomy Tube Mod Sed  12/26/2013   CLINICAL DATA:  53 year old male with metastatic adenocarcinoma, peritoneal carcinomatosis and chronic gastric outlet/ upper small bowel obstruction. A palliative preventing gastrostomy tube is warranted for patient comfort.  EXAM: PERC PLACEMENT GASTROSTOMY  Date: 12/26/2013  PROCEDURE: 1. Fluoroscopically guided placement of percutaneous pull-through gastrostomy tube. Interventional Radiologist:  Criselda Peaches, MD  ANESTHESIA/SEDATION: Moderate (conscious) sedation was used. Four mg Versed, 200 mcg Fentanyl were administered intravenously.  The patient's vital signs were monitored continuously by radiology nursing throughout the procedure. Additionally, 1 mg glucagon was administered intravenously.  Sedation Time: 20 minutes  FLUOROSCOPY TIME:  2 min 6 seconds  CONTRAST:  10 mL Omnipaque administered into the stomach  MEDICATIONS: 1 g vancomycin was administered intravenously within 1 hour of skin incision.  TECHNIQUE: Informed consent was obtained from the patient following explanation of the procedure, risks, benefits and alternatives. The patient understands, agrees and consents for the procedure. All questions were addressed. A time out was performed.  Maximal barrier sterile technique utilized including caps, mask, sterile gowns, sterile gloves, large sterile drape, hand hygiene, and chlorhexadine skin prep.  An angled catheter was advanced over a wire under fluoroscopic guidance through the nose, down the esophagus and into the body of the stomach. The stomach was then insufflated with several 100 ml of air. Fluoroscopy confirmed location of the gastric bubble, as well as inferior displacement of the barium stained colon. Under direct fluoroscopic guidance, a  single T-tack was placed, and the anterior gastric wall drawn up against the anterior abdominal wall. Percutaneous access was then obtained into the mid gastric body with an 18 gauge sheath needle. Aspiration of air, and injection of contrast material under fluoroscopy confirmed needle placement.  An Amplatz wire was advanced in the gastric body and the access needle exchanged for a 9-French vascular sheath. A snare device was advanced through the vascular sheath and an Amplatz wire advanced through the angled catheter. The Amplatz wire was successfully snared and this was pulled up through the esophagus and out the mouth. A 20-French Alinda Dooms MIC-PEG tube was then connected to the snare and pulled through the mouth, down the esophagus, into the stomach and out to the anterior abdominal wall. Hand injection of contrast material confirmed intragastric location. The T-tack retention suture was then cut. The pull through peg tube was then secured with the external bumper and capped.  The patient will be observed for several hours with the newly placed tube on low wall suction to evaluate for any post procedure complication. The patient tolerated the procedure well, there is no immediate complication.  IMPRESSION: Successful placement of a 20 French pull through gastrostomy tube.  Signed,  Criselda Peaches, MD  [facility]  [facility]   Electronically Signed   By: Jacqulynn Cadet M.D.   On: 12/26/2013 07:07   Dg Abd 2 Views  12/26/2013   CLINICAL DATA:  SBO.  EXAM: ABDOMEN - 2 VIEW  COMPARISON:  12/12/2013 and 12/24/2013  FINDINGS: Interval removal nasogastric tube. Placement of percutaneous gastrostomy tube with tip overlying the stomach in the left upper quadrant. Examination demonstrates persistence of air-filled mildly dilated small bowel loops in the left abdomen greater than right without significant change. Air and stool are present throughout the  colon. No evidence of free peritoneal air. A few scattered air-fluid levels are present. Remainder the exam is unchanged.  IMPRESSION: No change in several mildly dilated small bowel loops with air-fluid levels likely representing a partial small bowel obstruction.  Gastrostomy tube appears in adequate position over the left upper quadrant.   Electronically Signed   By: Marin Olp M.D.   On: 12/26/2013 08:39   Dg Abd 2 Views  12/24/2013   CLINICAL DATA:  Small bowel obstruction.  EXAM: ABDOMEN - 2 VIEW  COMPARISON:  12/22/2013  FINDINGS: NG tube is present with the tip in the proximal stomach. Decreasing gaseous distention of  the small bowel. Mild distention persists. Gas within nondistended colon. Prior cholecystectomy. No organomegaly or free air.  Visualized lung fields are clear.  IMPRESSION: Improving small bowel obstruction pattern.   Electronically Signed   By: Rolm Baptise M.D.   On: 12/24/2013 09:00   Dg Abd 2 Views  12/12/2013   CLINICAL DATA:  53 year old male with abdominal distention, known peritoneal carcinomatosis, suspected bowel obstruction. Initial encounter.  EXAM: ABDOMEN - 2 VIEW  COMPARISON:  CT Abdomen and Pelvis 12/11/2013.  FINDINGS: Upright view of the abdomen at 0800 hrs. No pneumoperitoneum. Stable right upper quadrant surgical clips. Minor lung base atelectasis.  The oral contrast administered yesterday has now reached the rectum, and is primarily distributed within the colon. Increased colonic gas. Still, several dilated gas distended loops are re- identified in the mid abdomen and right upper quadrant, up to 5 cm diameter. These are stable. Stable visualized osseous structures.  IMPRESSION: Partial small bowel obstruction, with interval transit of the oral contrast administered yesterday to the distal colon. No free air.  Study reviewed in person with Dr. Julieanne Manson on 12/12/2013 at 0850 hrs.   Electronically Signed   By: Lars Pinks M.D.   On: 12/12/2013 08:55   Dg Abd 2  Views  12/11/2013   CLINICAL DATA:  Followup small bowel obstruction.  EXAM: ABDOMEN - 2 VIEW  COMPARISON:  12/10/2013  FINDINGS: Small bowel dilation has mildly improved from the prior study, although there is still small bowel dilation and multiple air-fluid levels. This is consistent with mild improvement in a partial small bowel obstruction.  No free air.  Lung bases remain clear.  IMPRESSION: Less small bowel dilation seen consistent with mild improvement in a partial small bowel obstruction. There still findings, however, of bowel obstruction with milder dilated loops small bowel and persistent air-fluid levels.   Electronically Signed   By: Lajean Manes M.D.   On: 12/11/2013 13:27   Dg Abd 2 Views  12/10/2013   CLINICAL DATA:  Abdominal pain, nausea, vomiting and bloating. Colon cancer.  EXAM: ABDOMEN - 2 VIEW  COMPARISON:  CT chest abdomen pelvis 09/01/2013.  FINDINGS: There are dilated loops of small bowel with air-fluid levels in the central abdomen. Minimal colonic gas. No rectal mass. Surgical clips in the right upper quadrant. Visualized lung bases are clear.  IMPRESSION: Bowel gas pattern is indicative of small bowel obstruction.   Electronically Signed   By: Lorin Picket M.D.   On: 12/10/2013 13:29   Dg Abd Acute W/chest  12/22/2013   CLINICAL DATA:  Abdominal pain, nausea.  EXAM: ACUTE ABDOMEN SERIES (ABDOMEN 2 VIEW & CHEST 1 VIEW)  COMPARISON:  12/12/2013.  FINDINGS: Left Port-A-Cath is in place with the tip in the SVC. Heart is normal size. Lungs are clear. No effusions.  Dilated small bowel loops with air-fluid levels throughout the abdomen, worsening since prior study. Decreasing colonic gas. No free air organomegaly. Prior cholecystectomy.  IMPRESSION: Worsening small bowel distention and air-fluid levels compatible with worsening small bowel obstruction.  No acute cardiopulmonary disease.   Electronically Signed   By: Rolm Baptise M.D.   On: 12/22/2013 16:04    Microbiology: No  results found for this or any previous visit (from the past 240 hour(s)).   Labs: Basic Metabolic Panel:  Recent Labs Lab 12/24/13 0410 12/25/13 0500 12/26/13 0500 12/27/13 0655 12/28/13 0508 12/29/13 0552  NA 143 141 143 136* 139 140  K 3.2* 3.7 3.8 3.7 3.7 3.5*  CL 101 102 111 99 98 98  CO2 25 24 20 25 27 27   GLUCOSE 76 98 107* 103* 84 81  BUN 8 7 3* 4* 5* 5*  CREATININE 0.78 0.74 0.51 0.66 0.73 0.74  CALCIUM 8.9 8.7 6.5* 9.0 9.1 9.2  MG 1.9 1.6 1.2* 1.7 1.6  --    Liver Function Tests:  Recent Labs Lab 12/27/13 0655 12/29/13 0552  ALBUMIN 3.2* 3.3*   No results found for this basename: LIPASE, AMYLASE,  in the last 168 hours No results found for this basename: AMMONIA,  in the last 168 hours CBC:  Recent Labs Lab 12/24/13 0410  WBC 8.8  HGB 12.9*  HCT 37.6*  MCV 87.0  PLT 187   Cardiac Enzymes: No results found for this basename: CKTOTAL, CKMB, CKMBINDEX, TROPONINI,  in the last 168 hours BNP: BNP (last 3 results) No results found for this basename: PROBNP,  in the last 8760 hours CBG:  Recent Labs Lab 12/29/13 0053 12/29/13 0958 12/29/13 1643 12/30/13 0035 12/30/13 0734  GLUCAP 93 135* 150* 98 100*       Signed:  Regalado, Belkys A  [facility] 12/30/2013, 9:42 AM

## 2013-12-30 NOTE — Progress Notes (Signed)
Discharge orders were placed for pt. He was given his discharge instructions, prescriptions, and all questions were answered. He is to be transported home later this afternoon by his wife.

## 2013-12-30 NOTE — Progress Notes (Signed)
Faxed wife's fmla form to [phone]

## 2013-12-31 ENCOUNTER — Other Ambulatory Visit: Payer: Self-pay | Admitting: *Deleted

## 2013-12-31 ENCOUNTER — Other Ambulatory Visit: Payer: Self-pay | Admitting: Oncology

## 2013-12-31 ENCOUNTER — Other Ambulatory Visit: Payer: Self-pay | Admitting: Family Medicine

## 2014-01-01 ENCOUNTER — Telehealth: Payer: Self-pay | Admitting: *Deleted

## 2014-01-01 NOTE — Telephone Encounter (Signed)
Left VM asking for status of FMLA papers. Attempted to call back-no answer and voice mail not set up. Was able to reach patient at his home # and made him aware that papers were faxed to company on 12/30/13. They are requesting a copy of the papers and would like to pick them up Monday. Left message for Carmelina Noun that they want a copy on Monday.

## 2014-01-06 ENCOUNTER — Ambulatory Visit (HOSPITAL_BASED_OUTPATIENT_CLINIC_OR_DEPARTMENT_OTHER): Payer: Federal, State, Local not specified - PPO | Admitting: Nurse Practitioner

## 2014-01-06 ENCOUNTER — Telehealth: Payer: Self-pay | Admitting: Oncology

## 2014-01-06 VITALS — BP 119/77 | HR 72 | Temp 98.1°F | Resp 18 | Ht 70.0 in | Wt 163.6 lb

## 2014-01-06 DIAGNOSIS — C801 Malignant (primary) neoplasm, unspecified: Secondary | ICD-10-CM

## 2014-01-06 DIAGNOSIS — G8929 Other chronic pain: Secondary | ICD-10-CM

## 2014-01-06 DIAGNOSIS — M549 Dorsalgia, unspecified: Secondary | ICD-10-CM

## 2014-01-06 DIAGNOSIS — C50919 Malignant neoplasm of unspecified site of unspecified female breast: Secondary | ICD-10-CM

## 2014-01-06 DIAGNOSIS — C779 Secondary and unspecified malignant neoplasm of lymph node, unspecified: Secondary | ICD-10-CM

## 2014-01-06 NOTE — Telephone Encounter (Signed)
gv pt appt schedule july.

## 2014-01-06 NOTE — Progress Notes (Addendum)
[place] OFFICE PROGRESS NOTE   Diagnosis:  Metastatic adenocarcinoma with abdominal carcinomatosis.  INTERVAL HISTORY:   Mr. Viegas was hospitalized 12/22/2013 through 12/30/2013 with a worsening small bowel obstruction. A palliative gastrostomy tube was placed on 12/25/2013. He was discharged on a full liquid diet.  His energy level is poor. He continues a liquid diet. He states that he is "hungry". He continues to lose weight. He opens the G-tube to drain as needed, typically when he experiences indigestion or bloating. Abdominal and back pain unchanged. He reports fairly good pain control with a combination of the Duragesic patch and oxycodone. Bowels moving periodically. He has had leg cramps over the past few days.  Objective:  Vital signs in last 24 hours:  Blood pressure 119/77, pulse 72, temperature 98.1 F (36.7 C), temperature source Oral, resp. rate 18, height 5\' 10"  (1.778 m), weight 163 lb 9.6 oz (74.208 kg), SpO2 95.00%.    HEENT: No thrush or ulcerations. Resp: Lungs clear. Cardio: Regular cardiac rhythm. GI: Abdomen soft. Bowel sounds hyperactive. Marked tenderness at the right lower quadrant. G-tube site with thick drainage. Skin surrounding the site is erythematous. Vascular: No leg edema.    Lab Results:  Lab Results  Component Value Date   WBC 8.8 12/24/2013   HGB 12.9* 12/24/2013   HCT 37.6* 12/24/2013   MCV 87.0 12/24/2013   PLT 187 12/24/2013   NEUTROABS 7.1 12/22/2013    Imaging:  No results found.  Medications: I have reviewed the patient's current medications.  Assessment/Plan: 1. 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 CT of the chest 11/18/2012 with borderline enlarged mediastinal nodes, gastrohepatic ligament nodes and omental/peritoneal nodularity. Negative upper endoscopy.  Normal CEA on 12/13/2012. Status post cycle 1 FOLFOX 12/11/2012. Status post cycle 2 FOLFOX 12/25/2012. Status post cycle 3 on 01/15/2013. Cycle 4 on 01/29/2013; cycle 5 on 02/12/2013; cycle 6 on 02/26/2013. Restaging CT evaluation showed improvement. He completed cycle 8 on 03/26/2013. Oxaliplatin was discontinued following cycle 8 due to progressive neuropathy symptoms. He began maintenance Xeloda 04/24/2013. Xeloda dose reduced beginning with cycle 5 on 07/26/2013.  CA 19-9 increased at 297.4 on 09/01/2013  CTs 09/01/2013 with decreased chest and abdomen lymphadenopathy, increased omental nodularity  Cycle 1 of FOLFIRI 09/11/2013.  Cycle 2 FOLFIRI 10/09/2013.  Cycle 3 FOLFIRI 10/23/2013  Cycle 4 FOLFIRI 11/25/2013 2. Cecal/appendix mass noted at the time of exploratory laparotomy 11/13/2012. 3. COPD. 4. Pain secondary to carcinomatosis. 5. Chronic back pain. Stable.  6. Status post Port-A-Cath placement 12/02/2012. 7. Delayed nausea following cycle 1 FOLFOX. Aloxi was added with cycle 2. He had delayed nausea following cycle 2. Emend was added beginning with cycle 3. Prophylactic Decadron added beginning with cycle 8. He did not take as prescribed. He again had delayed nausea. 8. Diarrhea following cycle 2 FOLFOX. Question related to 5-fluorouracil. The diarrhea improved with Lomotil. The 5-fluorouracil bolus was eliminated and the dose of the 5 fluorouracil infusion was reduced beginning with cycle 3.  9. Left mid back pain/tenderness just lateral to the spine. Question etiology. Duragesic patch increased to 75 mcg every 3 days. He takes oxycodone as needed. He continues to have back pain. 10. Anorexia/weight loss. He began a trial of Megace following office visit 01/08/2013. Appetite is better. No longer taking Megace.  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 Increased with moderate  loss of vibratory  sense at the fingertips per tuning fork exam and interfering with activity when here 04/09/2013. Oxaliplatin was discontinued. Improved. 12. Nausea/vomiting with cycle 1 Xeloda. He has persistent nausea while on Xeloda. Improved with dose reduction beginning with cycle 5. 13. Frequent belching and abdominal bloating following cycle 4 FOLFIRI. 14. Hospitalization 12/11/2013 through 12/15/2013 with a small bowel obstruction. 15. Hospitalization 12/22/2013 through 12/30/2013 with a worsening small bowel obstruction. Palliative gastrostomy tube placed 12/25/2013.   Disposition: Mr. Jacober has had 2 recent admissions with small bowel obstruction. He has a palliative gastrostomy tube. His performance status is poor. He is unable to meet his nutritional needs on a liquid diet. We discussed initiation of the TPN. He would like to proceed with TPN. We will make a referral to the home care agency to initiate as soon as possible. He will return for a followup visit in the next 2-3 weeks.  Patient seen with Dr. Benay Spice.    Ned Card ANP/GNP-BC   01/06/2014  4:10 PM  This was a shared this with Ned Card. Mr. Duddy was interviewed and examined. He continues to have obstructive symptoms and is unable to maintain his nutrition by mouth. He does not have known visceral metastases. I recommend home TNA. [facility]. He will continue the current narcotic regimen.  Julieanne Manson, M.D.

## 2014-01-07 ENCOUNTER — Telehealth: Payer: Self-pay | Admitting: *Deleted

## 2014-01-07 NOTE — Telephone Encounter (Signed)
Called patient and instructed him to stop taking his lisinopril. Dr. Benay Spice feels his BP is too low to continue this. He understands and agrees. Adds that he did not take it today.

## 2014-01-16 ENCOUNTER — Other Ambulatory Visit: Payer: Self-pay | Admitting: *Deleted

## 2014-01-16 ENCOUNTER — Encounter: Payer: Self-pay | Admitting: *Deleted

## 2014-01-16 NOTE — Progress Notes (Signed)
Received labs from [facility] in -patient is on TPN. BUN higher at 29 and sodium low at 28/K+=2.5. Called [facility]: Reviewed labs with pharmacy-spoke with Holly-will have nurse repeat labs today and call patient to be sure he is doing his TPN. She asks if we want to start him on some po K+ since he has a G-tube.

## 2014-01-19 ENCOUNTER — Other Ambulatory Visit: Payer: Self-pay | Admitting: Family Medicine

## 2014-01-19 NOTE — Telephone Encounter (Signed)
Prescription sent to pharmacy.

## 2014-01-21 ENCOUNTER — Telehealth: Payer: Self-pay | Admitting: *Deleted

## 2014-01-21 NOTE — Telephone Encounter (Signed)
Received message from Judson Roch, RN [facility] re:  "Need verbal order to continue management of TPN for 2 weeks."  Per Dr. Benay Spice; spoke with [facility] and gave verbal order to continue TPN management for 2 weeks.  Sarah verbalized understanding.

## 2014-01-26 ENCOUNTER — Ambulatory Visit (HOSPITAL_BASED_OUTPATIENT_CLINIC_OR_DEPARTMENT_OTHER): Payer: Federal, State, Local not specified - PPO | Admitting: Nurse Practitioner

## 2014-01-26 ENCOUNTER — Telehealth: Payer: Self-pay | Admitting: Oncology

## 2014-01-26 VITALS — BP 133/70 | HR 60 | Temp 98.3°F | Resp 18 | Ht 70.0 in | Wt 161.9 lb

## 2014-01-26 DIAGNOSIS — C800 Disseminated malignant neoplasm, unspecified: Secondary | ICD-10-CM

## 2014-01-26 DIAGNOSIS — T451X5A Adverse effect of antineoplastic and immunosuppressive drugs, initial encounter: Secondary | ICD-10-CM

## 2014-01-26 DIAGNOSIS — G620 Drug-induced polyneuropathy: Secondary | ICD-10-CM

## 2014-01-26 DIAGNOSIS — C762 Malignant neoplasm of abdomen: Secondary | ICD-10-CM

## 2014-01-26 DIAGNOSIS — C801 Malignant (primary) neoplasm, unspecified: Secondary | ICD-10-CM

## 2014-01-26 DIAGNOSIS — R350 Frequency of micturition: Secondary | ICD-10-CM

## 2014-01-26 DIAGNOSIS — R197 Diarrhea, unspecified: Secondary | ICD-10-CM

## 2014-01-26 DIAGNOSIS — R634 Abnormal weight loss: Secondary | ICD-10-CM

## 2014-01-26 DIAGNOSIS — G893 Neoplasm related pain (acute) (chronic): Secondary | ICD-10-CM

## 2014-01-26 MED ORDER — OXYCODONE HCL 20 MG/ML PO CONC: 10.0000 mg | ORAL | Status: AC | PRN

## 2014-01-26 MED ORDER — FENTANYL 100 MCG/HR TD PT72: 100.0000 ug | MEDICATED_PATCH | TRANSDERMAL | Status: DC

## 2014-01-26 MED ORDER — SORBITOL 70 % PO SOLN: 30.0000 mL | Freq: Every day | ORAL | Status: DC | PRN

## 2014-01-26 NOTE — Progress Notes (Addendum)
[place] OFFICE PROGRESS NOTE   Diagnosis:  Metastatic adenocarcinoma with abdominal carcinomatosis.   INTERVAL HISTORY:   Mr. Leino returns as scheduled. He is now receiving TPN. He notes "a little more energy". He continues to have abdominal pain. He is currently on a Duragesic 75 mcg patch every 3 days and takes oxycodone every 6-8 hours. He thinks the patch strength needs to be increased to 100 mcg. He continues to have constipation. He has a bowel movement about every 4 days. No significant nausea. He opens the G-tube to drain as needed. He is concerned the G-tube site is infected. He notes a drainage around the tube site. No pain or fever. He has recently noted frequent urination. He is also noted that the urine is "cloudy". He reports the home nurses are unable to get blood from the Port-A-Cath. He has significant pain when the port is accessed despite using EMLA cream.  Objective:  Vital signs in last 24 hours:  Blood pressure 133/70, pulse 60, temperature 98.3 F (36.8 C), temperature source Oral, resp. rate 18, height 5\' 10"  (1.778 m), weight 161 lb 14.4 oz (73.437 kg), SpO2 100.00%.    Resp: Initial rhonchi which cleared with coughing. Cardio: Regular cardiac rhythm. GI: Abdomen is soft. Hypoactive bowel sounds. G-tube site is without surrounding erythema. Small amount of thick secretions noted at the site. Vascular: No leg edema.  Port-A-Cath site without erythema.  Lab Results:  Lab Results  Component Value Date   WBC 8.8 12/24/2013   HGB 12.9* 12/24/2013   HCT 37.6* 12/24/2013   MCV 87.0 12/24/2013   PLT 187 12/24/2013   NEUTROABS 7.1 12/22/2013    Imaging:  No results found.  Medications: I have reviewed the patient's current medications.  Assessment/Plan: 1. Abdominal carcinomatosis with multiple biopsies on 11/13/2012 confirming metastatic adenocarcinoma, immunophenotype most consistent with an upper gastrointestinal or pancreaticobiliary  primary. CT of the chest 11/18/2012 with borderline enlarged mediastinal nodes, gastrohepatic ligament nodes and omental/peritoneal nodularity. Negative upper endoscopy. Normal CEA on 12/13/2012. Status post cycle 1 FOLFOX 12/11/2012. Status post cycle 2 FOLFOX 12/25/2012. Status post cycle 3 on 01/15/2013. Cycle 4 on 01/29/2013; cycle 5 on 02/12/2013; cycle 6 on 02/26/2013. Restaging CT evaluation showed improvement. He completed cycle 8 on 03/26/2013. Oxaliplatin was discontinued following cycle 8 due to progressive neuropathy symptoms. He began maintenance Xeloda 04/24/2013. Xeloda dose reduced beginning with cycle 5 on 07/26/2013.  CA 19-9 increased at 297.4 on 09/01/2013  CTs 09/01/2013 with decreased chest and abdomen lymphadenopathy, increased omental nodularity  Cycle 1 of FOLFIRI 09/11/2013.  Cycle 2 FOLFIRI 10/09/2013.  Cycle 3 FOLFIRI 10/23/2013  Cycle 4 FOLFIRI 11/25/2013 2. Cecal/appendix mass noted at the time of exploratory laparotomy 11/13/2012. 3. COPD. 4. Pain secondary to carcinomatosis. 5. Chronic back pain. Stable.  6. Status post Port-A-Cath placement 12/02/2012. 7. Delayed nausea following cycle 1 FOLFOX. Aloxi was added with cycle 2. He had delayed nausea following cycle 2. Emend was added beginning with cycle 3. Prophylactic Decadron added beginning with cycle 8. He did not take as prescribed. He again had delayed nausea. 8. Diarrhea following cycle 2 FOLFOX. Question related to 5-fluorouracil. The diarrhea improved with Lomotil. The 5-fluorouracil bolus was eliminated and the dose of the 5 fluorouracil infusion was reduced beginning with cycle 3.  9. Left mid back pain/tenderness just lateral to the spine. Question etiology. Duragesic patch increased to 75 mcg every 3 days. He takes oxycodone as needed. He continues to have back pain. 10. Anorexia/weight  loss. He began a trial of Megace following office visit 01/08/2013. Appetite is better. No longer taking Megace.   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 Increased with moderate loss of vibratory sense at the fingertips per tuning fork exam and interfering with activity when here 04/09/2013. Oxaliplatin was discontinued. Improved. 12. Nausea/vomiting with cycle 1 Xeloda. He has persistent nausea while on Xeloda. Improved with dose reduction beginning with cycle 5. 13. Frequent belching and abdominal bloating following cycle 4 FOLFIRI. 14. Hospitalization 12/11/2013 through 12/15/2013 with a small bowel obstruction. 15. Hospitalization 12/22/2013 through 12/30/2013 with a worsening small bowel obstruction. Palliative gastrostomy tube placed 12/25/2013. 16. Nutrition. He is now receiving TPN.   Disposition: Mr. Turman appears unchanged. He continues TPN. For the abdominal pain we will increase the Duragesic patch to 100 mcg every 3 days. He will continue oxycodone as needed. He will try sorbitol through the G-tube for constipation. He is having pain when the Port-A-Cath is accessed despite using Emla cream. He will try applying ice. We will request his home care nurse to TPA the Port-A-Cath due to inability to obtain blood. He will return for a followup visit in approximately 2 weeks. He will contact the office in the interim with any problems.  Patient seen with Dr. Benay Spice. 25 minutes were spent face-to-face at today's visit with the majority of that time involved in counseling/coordination of care.    Ned Card ANP/GNP-BC   01/26/2014  11:35 AM  This was a shared visit with Ned Card.  Mr. Krichbaum was interviewed and examined.  He continues TNA. The constipation is likely secondary to carcinomatosis and narcotics.  Julieanne Manson, M.D.

## 2014-01-26 NOTE — Telephone Encounter (Signed)
gv adn rpitned aptp sched and avs for pt for Aug

## 2014-01-28 ENCOUNTER — Encounter: Payer: Self-pay | Admitting: Oncology

## 2014-01-28 ENCOUNTER — Telehealth: Payer: Self-pay

## 2014-01-28 ENCOUNTER — Other Ambulatory Visit: Payer: Self-pay

## 2014-01-28 NOTE — Telephone Encounter (Signed)
Patient requested to have [facility] do his TPA at home, patient was not getting any blood return from [facility], called [place] with Jeannetta Nap, she informed me that as long as is insurance covers it then they can do it in home, orders given for [facility] from Dr. Benay Spice, [place] and informed patient [facility] should be contacting him regarding the TPA. Patient verbalized understanding. Patient also complained the medication we were giving him for his bowels to put in his tube was not working, patient states last BM was a week ago and wants to know if he can use a suppository. Spoke with Ned Card, NP and orders for fleets enema OTC was given to patient. Informed patient to call back with anymore signs of discomfort or not having a BM after the specified time of the enema. Patient verbalized understanding denies any other questions or concerns at this time.

## 2014-02-02 ENCOUNTER — Telehealth: Payer: Self-pay | Admitting: *Deleted

## 2014-02-02 NOTE — Telephone Encounter (Signed)
Call from [place] with [facility] requesting to extend visits through end of pt's certification period. Reviewed with Dr. Benay Spice: Order given. Clarise Cruz reports pt is constipated. Last BM 7/29 after an enema. Reviewed with Dr. Benay Spice: Have pt try Sorbitol via PEG, if no results may use enema. She voiced understanding.

## 2014-02-03 ENCOUNTER — Telehealth: Payer: Self-pay | Admitting: *Deleted

## 2014-02-03 NOTE — Telephone Encounter (Signed)
Received faxed labs from [facility]. CBC and CMET results on Dr. Gearldine Shown desk for review.

## 2014-02-04 ENCOUNTER — Encounter: Payer: Self-pay | Admitting: Oncology

## 2014-02-10 ENCOUNTER — Ambulatory Visit (HOSPITAL_BASED_OUTPATIENT_CLINIC_OR_DEPARTMENT_OTHER): Payer: Federal, State, Local not specified - PPO | Admitting: Oncology

## 2014-02-10 ENCOUNTER — Telehealth: Payer: Self-pay | Admitting: *Deleted

## 2014-02-10 ENCOUNTER — Telehealth: Payer: Self-pay | Admitting: Oncology

## 2014-02-10 VITALS — BP 118/73 | HR 62 | Temp 98.4°F | Resp 19 | Ht 70.0 in | Wt 157.5 lb

## 2014-02-10 DIAGNOSIS — C800 Disseminated malignant neoplasm, unspecified: Secondary | ICD-10-CM

## 2014-02-10 DIAGNOSIS — C801 Malignant (primary) neoplasm, unspecified: Secondary | ICD-10-CM

## 2014-02-10 DIAGNOSIS — C786 Secondary malignant neoplasm of retroperitoneum and peritoneum: Secondary | ICD-10-CM

## 2014-02-10 DIAGNOSIS — G893 Neoplasm related pain (acute) (chronic): Secondary | ICD-10-CM

## 2014-02-10 DIAGNOSIS — C762 Malignant neoplasm of abdomen: Secondary | ICD-10-CM

## 2014-02-10 MED ORDER — FENTANYL 100 MCG/HR TD PT72: 100.0000 ug | MEDICATED_PATCH | TRANSDERMAL | Status: AC

## 2014-02-10 MED ORDER — LORAZEPAM 1 MG PO TABS: 1.0000 mg | ORAL_TABLET | Freq: Four times a day (QID) | ORAL | Status: AC | PRN

## 2014-02-10 NOTE — Telephone Encounter (Signed)
Called to request TPN be changed to infuse during the night only after discussion of Dr. Benay Spice and patient today. Keep same calories, just infuse over the night.

## 2014-02-10 NOTE — Progress Notes (Signed)
[place] OFFICE PROGRESS NOTE   Diagnosis: Abdominal carcinomatosis  INTERVAL HISTORY:   Frank Baird returns as scheduled. He continues home TNA. He drinks a lot of fluid and most of it comes out the gastrostomy tube. He is not having bowel movements. He used an enema and had a small amount of stool. No vomiting. Nausea is relieved when he opens the gastrostomy tube. He continues oxycodone every 4-6 hours for pain. He reports a home nurse has been unable to obtain labs from his Port-A-Cath.  Objective:  Vital signs in last 24 hours:  Blood pressure 118/73, pulse 62, temperature 98.4 F (36.9 C), temperature source Oral, resp. rate 19, height 5\' 10"  (1.778 m), weight 157 lb 8 oz (71.442 kg).    HEENT: [place] over the tongue, no buccal thrush Resp: Lungs with end inspiratory rhonchi at the posterior bases bilaterally, no respiratory distress Cardio: Regular rate and rhythm with premature beats GI: Left upper quadrant gastrostomy tube site without evidence of infection, firmness throughout the right abdomen with associated tenderness. The abdomen is not distended. Vascular: No leg edema   Portacath/PICC-without erythema  Lab Results:  Labs from 02/09/2014 at [facility]-BUN 23, creatinine 0.9, potassium 3.7  Medications: I have reviewed the patient's current medications.  Assessment/Plan: 1. 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 CT of the chest 11/18/2012 with borderline enlarged mediastinal nodes, gastrohepatic ligament nodes and omental/peritoneal nodularity. Negative upper endoscopy. Normal CEA on 12/13/2012. Status post cycle 1 FOLFOX 12/11/2012. Status post cycle 2 FOLFOX 12/25/2012. Status post cycle 3 on 01/15/2013. Cycle 4 on 01/29/2013; cycle 5 on 02/12/2013; cycle 6 on 02/26/2013. Restaging CT  evaluation showed improvement. He completed cycle 8 on 03/26/2013. Oxaliplatin was discontinued following cycle 8 due to progressive neuropathy symptoms. He began maintenance Xeloda 04/24/2013. Xeloda dose reduced beginning with cycle 5 on 07/26/2013.  CA 19-9 increased at 297.4 on 09/01/2013  CTs 09/01/2013 with decreased chest and abdomen lymphadenopathy, increased omental nodularity  Cycle 1 of FOLFIRI 09/11/2013.  Cycle 2 FOLFIRI 10/09/2013.  Cycle 3 FOLFIRI 10/23/2013  Cycle 4 FOLFIRI 11/25/2013 2. Cecal/appendix mass noted at the time of exploratory laparotomy 11/13/2012. 3. COPD. 4. Pain secondary to carcinomatosis. 5. Chronic back pain. Stable.  6. Status post Port-A-Cath placement 12/02/2012. 7. Delayed nausea following cycle 1 FOLFOX. Aloxi was added with cycle 2. He had delayed nausea following cycle 2. Emend was added beginning with cycle 3. Prophylactic Decadron added beginning with cycle 8. He did not take as prescribed. He again had delayed nausea. 8. Diarrhea following cycle 2 FOLFOX. Question related to 5-fluorouracil. The diarrhea improved with Lomotil. The 5-fluorouracil bolus was eliminated and the dose of the 5 fluorouracil infusion was reduced beginning with cycle 3.  9. Left mid back pain/tenderness just lateral to the spine. Question etiology. Duragesic patch increased to 75 mcg every 3 days. He takes oxycodone as needed. He continues to have back pain. 10. Anorexia/weight loss. He began a trial of Megace following office visit 01/08/2013. Appetite is better. No longer taking Megace.  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 Increased with moderate loss of vibratory sense at the fingertips per tuning fork exam and interfering with activity when here 04/09/2013. Oxaliplatin was discontinued. Improved. 12. Nausea/vomiting with cycle 1 Xeloda. He has persistent nausea while on Xeloda.  Improved with dose reduction beginning with cycle 5. 13.  Frequent belching and abdominal bloating following cycle 4 FOLFIRI. 14. Hospitalization 12/11/2013 through 12/15/2013 with a small bowel obstruction. 15. Hospitalization 12/22/2013 through 12/30/2013 with a worsening small bowel obstruction. Palliative gastrostomy tube placed 12/25/2013. 16. Nutrition. He is now receiving TPN.  Disposition:  His overall status appears unchanged. He will continue the current narcotic regimen. Frank Baird would like to be disconnected from the TPN for part of the day. [facility] will [facility] the TNA at night.  He is obstructed from carcinomatosis. He will continue liquids as tolerated. He will use a Dulcolax suppository and enemas as needed.  We discussed Hospice care. He does not wish to enroll in Hospice at present.  Frank Baird will return for an office visit in 3 weeks.  Betsy Coder, MD  02/10/2014  10:17 AM

## 2014-02-10 NOTE — Telephone Encounter (Signed)
Pt confirmed labs/ov per 08/11 POF, gave pt AVS...KJ °

## 2014-02-12 ENCOUNTER — Encounter: Payer: Self-pay | Admitting: Oncology

## 2014-02-17 ENCOUNTER — Ambulatory Visit (HOSPITAL_BASED_OUTPATIENT_CLINIC_OR_DEPARTMENT_OTHER): Payer: Federal, State, Local not specified - PPO

## 2014-02-17 ENCOUNTER — Telehealth: Payer: Self-pay | Admitting: *Deleted

## 2014-02-17 ENCOUNTER — Encounter: Payer: Self-pay | Admitting: Nurse Practitioner

## 2014-02-17 ENCOUNTER — Ambulatory Visit (HOSPITAL_BASED_OUTPATIENT_CLINIC_OR_DEPARTMENT_OTHER): Payer: Federal, State, Local not specified - PPO | Admitting: Nurse Practitioner

## 2014-02-17 VITALS — BP 147/78 | HR 61 | Temp 97.7°F | Resp 22 | Wt 154.2 lb

## 2014-02-17 DIAGNOSIS — C800 Disseminated malignant neoplasm, unspecified: Secondary | ICD-10-CM

## 2014-02-17 DIAGNOSIS — C801 Malignant (primary) neoplasm, unspecified: Secondary | ICD-10-CM

## 2014-02-17 DIAGNOSIS — R112 Nausea with vomiting, unspecified: Secondary | ICD-10-CM

## 2014-02-17 DIAGNOSIS — R630 Anorexia: Secondary | ICD-10-CM

## 2014-02-17 DIAGNOSIS — R634 Abnormal weight loss: Secondary | ICD-10-CM

## 2014-02-17 DIAGNOSIS — C189 Malignant neoplasm of colon, unspecified: Secondary | ICD-10-CM

## 2014-02-17 DIAGNOSIS — E8809 Other disorders of plasma-protein metabolism, not elsewhere classified: Secondary | ICD-10-CM | POA: Insufficient documentation

## 2014-02-17 DIAGNOSIS — R740 Nonspecific elevation of levels of transaminase and lactic acid dehydrogenase [LDH]: Secondary | ICD-10-CM

## 2014-02-17 DIAGNOSIS — C772 Secondary and unspecified malignant neoplasm of intra-abdominal lymph nodes: Secondary | ICD-10-CM

## 2014-02-17 DIAGNOSIS — R7402 Elevation of levels of lactic acid dehydrogenase (LDH): Secondary | ICD-10-CM

## 2014-02-17 DIAGNOSIS — R7401 Elevation of levels of liver transaminase levels: Secondary | ICD-10-CM

## 2014-02-17 DIAGNOSIS — K5900 Constipation, unspecified: Secondary | ICD-10-CM | POA: Insufficient documentation

## 2014-02-17 DIAGNOSIS — R109 Unspecified abdominal pain: Secondary | ICD-10-CM | POA: Insufficient documentation

## 2014-02-17 LAB — CBC WITH DIFFERENTIAL/PLATELET
BASO%: 0.3 % (ref 0.0–2.0)
Basophils Absolute: 0 10*3/uL (ref 0.0–0.1)
EOS ABS: 0 10*3/uL (ref 0.0–0.5)
EOS%: 0.1 % (ref 0.0–7.0)
HCT: 42.3 % (ref 38.4–49.9)
HGB: 14.3 g/dL (ref 13.0–17.1)
LYMPH%: 12.2 % — AB (ref 14.0–49.0)
MCH: 28 pg (ref 27.2–33.4)
MCHC: 33.8 g/dL (ref 32.0–36.0)
MCV: 82.8 fL (ref 79.3–98.0)
MONO#: 0.6 10*3/uL (ref 0.1–0.9)
MONO%: 5.1 % (ref 0.0–14.0)
NEUT%: 82.3 % — ABNORMAL HIGH (ref 39.0–75.0)
NEUTROS ABS: 9.4 10*3/uL — AB (ref 1.5–6.5)
PLATELETS: 252 10*3/uL (ref 140–400)
RBC: 5.11 10*6/uL (ref 4.20–5.82)
RDW: 13.6 % (ref 11.0–14.6)
WBC: 11.4 10*3/uL — AB (ref 4.0–10.3)
lymph#: 1.4 10*3/uL (ref 0.9–3.3)

## 2014-02-17 LAB — COMPREHENSIVE METABOLIC PANEL (CC13)
ALBUMIN: 3.3 g/dL — AB (ref 3.5–5.0)
ALK PHOS: 209 U/L — AB (ref 40–150)
ALT: 43 U/L (ref 0–55)
AST: 35 U/L — ABNORMAL HIGH (ref 5–34)
Anion Gap: 16 mEq/L — ABNORMAL HIGH (ref 3–11)
BILIRUBIN TOTAL: 0.8 mg/dL (ref 0.20–1.20)
BUN: 26.1 mg/dL — ABNORMAL HIGH (ref 7.0–26.0)
CO2: 25 mEq/L (ref 22–29)
Calcium: 9.7 mg/dL (ref 8.4–10.4)
Chloride: 103 mEq/L (ref 98–109)
Creatinine: 1 mg/dL (ref 0.7–1.3)
Glucose: 198 mg/dl — ABNORMAL HIGH (ref 70–140)
Potassium: 3.7 mEq/L (ref 3.5–5.1)
Sodium: 143 mEq/L (ref 136–145)
Total Protein: 7.5 g/dL (ref 6.4–8.3)

## 2014-02-17 MED ORDER — HYDROMORPHONE HCL PF 4 MG/ML IJ SOLN
INTRAMUSCULAR | Status: AC
  Filled 2014-02-17: qty 1

## 2014-02-17 MED ORDER — ONDANSETRON 8 MG/50ML IVPB (CHCC)
8.0000 mg | Freq: Once | INTRAVENOUS | Status: AC
  Administered 2014-02-17: 8 mg via INTRAVENOUS

## 2014-02-17 MED ORDER — ONDANSETRON 8 MG/NS 50 ML IVPB
INTRAVENOUS | Status: AC
  Filled 2014-02-17: qty 8

## 2014-02-17 MED ORDER — HYDROMORPHONE HCL PF 4 MG/ML IJ SOLN
2.0000 mg | Freq: Once | INTRAMUSCULAR | Status: AC
  Administered 2014-02-17: 2 mg via INTRAVENOUS

## 2014-02-17 NOTE — Assessment & Plan Note (Signed)
Patient has lost another 10 pounds since his last weight check.  He states he has minimal to no appetite whatsoever.  He is unable to take in the solid food; but states he has been trying to drink fluids.  Most likely, this is due to progression of patient's disease and probable worsening of his bowel obstruction due to tumor burden.

## 2014-02-17 NOTE — Assessment & Plan Note (Signed)
Patient currently undergoing no active treatment.  Last received Folfiri chemotherapy in May 2015.

## 2014-02-17 NOTE — Assessment & Plan Note (Signed)
Albumin continues to trend downward.  The patient was encouraged to push protein is much as was possible.

## 2014-02-17 NOTE — Assessment & Plan Note (Signed)
AST is slightly elevated to 35.  ALT is 43.

## 2014-02-17 NOTE — Assessment & Plan Note (Signed)
Patient's chronic pain has been fairly well managed with an increase in signal patch 200 mcg every 72 hours and oxycodone elixir 1 ML of for breakthrough pain.  However, he did awaken at approximately 3 AM this morning with severe abdominal discomfort that was radiating to his back.  Most likely, this is a symptom of patient's progression of disease.  Patient was able to obtain as partial relief of [email or web address] discomfort with a Dilaudid given intravenously while in the infusion Center.  Patient was advised to increase and sentinel 275 mcg patch; and to increase his oxycodone elixir to 1.5 ML's for breakthrough pain.  Advised patient that he would be able to increase his oxycodone elixir up to 2 ML's if needed for breakthrough pain.  Also, after long discussion patient did make the decision to have hospice come into his home for evaluation and further care.  Patient stated that he and his wife would need to talk further prior to making the decision regarding [facility].

## 2014-02-17 NOTE — Progress Notes (Signed)
Frank Baird   Chief Complaint  Patient presents with  . Abdominal Pain    HPI: Frank Baird 53 y.o. male diagnosed with abdominal carcinomatosis.  Status post Folfiri chemotherapy.  Patient last received his chemotherapy in May 2015.  Patient called the cancer Center today requesting an urgent care visit.  He had previously been managing his pain with sentinel 100 mcg patch in oxycodone elixir 1 ML of for breakthrough pain.  He states that he awoke at approximate 3 AM this morning with severe, worsening abdominal discomfort that radiated to his back.  He states that he is try to increase his pain medication-but this was not effective.  He states that he has been chronically nauseous; and has vomited 2 times today.  He states that his nausea somewhat relieved when he empties his G-tube contents.  Also, I patient has had no substantial bowel movement approximate 10 days.  This may be due to the fact that patient has taken in minimal solid foods recently.  He states he did try to give himself an enema this morning; only minimal effect.  He denies any recent fevers or chills.   Abdominal Pain Associated symptoms include nausea, vomiting and weight loss. Pertinent negatives include no fever.    CURRENT THERAPY: Upcoming Treatment Dates - COLORECTAL FOLFIRI q14d Days with orders from any treatment category:  11/27/2013      [facility] COMMUNICATION      sodium chloride 0.9 % injection 10 mL      heparin lock flush 100 unit/mL      heparin lock flush 100 unit/mL      alteplase (CATHFLO ACTIVASE) injection 2 mg      sodium chloride 0.9 % injection 3 mL      Cold Pack 1 packet 12/24/2013      SCHEDULING COMMUNICATION      palonosetron (ALOXI) injection 0.25 mg      fosaprepitant (EMEND) 150 mg in sodium chloride 0.9 % 145 mL IVPB      Dexamethasone Sodium Phosphate (DECADRON) injection 12 mg      irinotecan (CAMPTOSAR) 370 mg in dextrose 5 % 500 mL chemo infusion  leucovorin 820 mg in dextrose 5 % 250 mL infusion      fluorouracil (ADRUCIL) 3,950 mg in sodium chloride 0.9 % 150 mL chemo infusion      atropine injection 0.5 mg      sodium chloride 0.9 % injection 10 mL      heparin lock flush 100 unit/mL      heparin lock flush 100 unit/mL      alteplase (CATHFLO ACTIVASE) injection 2 mg      sodium chloride 0.9 % injection 3 mL      Cold Pack 1 packet      0.9 %  sodium chloride infusion      TREATMENT CONDITIONS 12/26/2013      SCHEDULING COMMUNICATION      sodium chloride 0.9 % injection 10 mL      heparin lock flush 100 unit/mL      heparin lock flush 100 unit/mL      alteplase (CATHFLO ACTIVASE) injection 2 mg      sodium chloride 0.9 % injection 3 mL      Cold Pack 1 packet    Review of Systems  Constitutional: Positive for weight loss and malaise/fatigue. Negative for fever and chills.  HENT: Negative.   Eyes: Negative.   Respiratory: Negative.   Cardiovascular: Negative.  Gastrointestinal: Positive for nausea, vomiting and abdominal pain. Negative for blood in stool.  Genitourinary: Negative.   Musculoskeletal: Negative.   Skin: Negative.   Neurological: Negative.   Endo/Heme/Allergies: Negative.   Psychiatric/Behavioral: Negative.   All other systems reviewed and are negative.   Past Medical History  Diagnosis Date  . RUQ pain   . Complication of anesthesia     difficulty waking up  . Arthritis   . GERD (gastroesophageal reflux disease)   . Diabetes mellitus   . Hyperlipidemia   . Hypertension   . History of gout   . COPD (chronic obstructive pulmonary disease)   . Anxiety   . Depression   . Cancer     colon ca    Past Surgical History  Procedure Laterality Date  . Shoulder surgery  2012    right shoulder rotator cuff  . Nasal sinus surgery  2000  . Hernia repair      umbilical and right inguinal  . Elbow surgery      right elbow ligament  . Eye surgery      bilateral cataract surgery  . Cholecystectomy   09/26/2011    Procedure: LAPAROSCOPIC CHOLECYSTECTOMY WITH INTRAOPERATIVE CHOLANGIOGRAM;  Surgeon: Edward Jolly, MD;  Location: [facility];  Service: General;  Laterality: N/A;  . Laparoscopic appendectomy N/A 11/12/2012    Procedure: DIAGNOSTIC LAPAROSCOPY, OPEN EXPLORATORY LAPAROTOMY, INCISIONAL BIOPSIES OF CALICFORM LIGAMENT, MESSENTERY, UMBILICAL LIGAMENT, BAND OF DUODENUM;  Surgeon: Madilyn Hook, DO;  Location: [facility];  Service: General;  Laterality: N/A;  . 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     is allergic to bee venom; codeine; and penicillins.    Medication List       This list is accurate as of: 02/17/14  5:39 PM.  Always use your most recent med list.               albuterol 108 (90 BASE) MCG/ACT inhaler  Commonly known as:  PROVENTIL HFA;VENTOLIN HFA  Inhale 2 puffs into the lungs every 6 (six) hours as needed for wheezing.     fentaNYL 100 MCG/HR  Commonly known as:  DURAGESIC - dosed mcg/hr  Place 1 patch (100 mcg total) onto the skin every 3 (three) days.     HYDROmorphone 4 MG tablet  Commonly known as:  DILAUDID     lidocaine-prilocaine cream  Commonly known as:  EMLA  Apply to port a cath site one hour prior to chemo. Do not rub in. Cover with plastic.     LORazepam 1 MG tablet  Commonly known as:  ATIVAN  Take 1 tablet (1  mg total) by mouth every 6 (six) hours as needed for anxiety or sleep.     NEXIUM 40 MG capsule  Generic drug:  esomeprazole  TAKE ONE CAPSULE BY MOUTH EVERY DAY BEFORE BREAKFAST     ondansetron 8 MG tablet  Commonly known as:  ZOFRAN  Take 1 tablet (8 mg total) by mouth every 8 (eight)  hours as needed for nausea.     oxyCODONE 20 MG/ML concentrated solution  Commonly known as:  ROXICODONE INTENSOL  Place 0.5-1 mLs (10-20 mg total) under the tongue every 4 (four) hours as needed for moderate pain or severe pain.     pioglitazone 30 MG tablet  Commonly known as:  ACTOS  TAKE 1 TABLET BY MOUTH EVERY DAY     sodium phosphate enema  Commonly known as:  FLEET  Place 1 enema rectally as needed. follow package directions     TPN ADULT  Inject into the vein continuous. Via [place] hour infusion         PHYSICAL EXAMINATION  Blood pressure 147/78, pulse 61, temperature 97.7 F (36.5 C), temperature source Oral, resp. rate 22, weight 154 lb 3.2 oz (69.945 kg).  Physical Exam  Nursing note and vitals reviewed. Constitutional: He is oriented to person, place, and time. Vital signs are normal. He appears to be writhing in pain, malnourished and dehydrated. He appears unhealthy. He has a sickly appearance.  HENT:  Head: Normocephalic and atraumatic.  Eyes: Conjunctivae are normal. Pupils are equal, round, and reactive to light.  Neck: Normal range of motion. Neck supple.  Cardiovascular: Normal rate and regular rhythm.  Exam reveals no friction rub.   No murmur heard. Pulmonary/Chest: Effort normal and breath sounds normal.  Abdominal: He exhibits mass. He exhibits no distension. There is tenderness. There is no rebound and no guarding.  Generalized abdominal tenderness with palpation.  The palpated mass noted directly above the umbilicus.  Minimal to no bowel sounds noted on auscultation.    Musculoskeletal: Normal range of motion. He exhibits no edema and no tenderness.   Lymphadenopathy:    He has no cervical adenopathy.  Neurological: He is alert and oriented to person, place, and time. Gait normal.  Skin: Skin is warm and dry.  Psychiatric: Affect normal.    LABORATORY DATA:. CBC  Lab Results  Component Value Date   WBC 11.4* 02/17/2014   RBC 5.11 02/17/2014   HGB 14.3 02/17/2014   HCT 42.3 02/17/2014   PLT 252 02/17/2014   MCV 82.8 02/17/2014   MCH 28.0 02/17/2014   MCHC 33.8 02/17/2014   RDW 13.6 02/17/2014   LYMPHSABS 1.4 02/17/2014   MONOABS 0.6 02/17/2014   EOSABS 0.0 02/17/2014   BASOSABS 0.0 02/17/2014     CMET  Lab Results  Component Value Date   NA 143 02/17/2014   K 3.7 02/17/2014   CL 98 12/29/2013   CO2 25 02/17/2014   GLUCOSE 198* 02/17/2014   BUN 26.1* 02/17/2014   CREATININE 1.0 02/17/2014   CALCIUM 9.7 02/17/2014   PROT 7.5 02/17/2014   ALBUMIN 3.3* 02/17/2014   AST 35* 02/17/2014   ALT 43 02/17/2014   ALKPHOS 209* 02/17/2014   BILITOT 0.80 02/17/2014   GFRNONAA >90 12/29/2013   GFRAA >90 12/29/2013    ASSESSMENT/PLAN:    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  Last received Folfiri chemotherapy in May 2015.   Abdominal pain, unspecified site  Assessment & Plan Patient's chronic pain has been fairly well managed with an increase in signal patch 100 mcg every 72 hours and oxycodone elixir 1 ML of for breakthrough pain.  However, he did awaken at approximately 3 AM this morning with severe abdominal discomfort that was radiating to his back.  Most likely, this is a symptom of patient's progression of disease.  Patient was able to obtain as partial relief of [email or web address] discomfort with a Dilaudid given intravenously while in the infusion Center.  Patient was advised to increase the Duragesic to 175 mcg by patch; and to increase his oxycodone elixir to 1.5 ML's for breakthrough pain.  Advised patient that he would be able to increase his oxycodone  elixir up to 2 ML's if needed for breakthrough pain.  Also, after long discussion patient did make the decision to have hospice come into his home for evaluation and further care.  Patient stated that he and his wife would need to talk further prior to making the decision regarding CODE STATUS.   Anorexia  Assessment & Plan Patient has lost another 10 pounds since his last weight check.  He states he has minimal to no appetite whatsoever.  He is unable to take in the solid food; but states he has been trying to drink fluids.  Most likely, this is due to progression of patient's disease and probable worsening of his bowel obstruction due to tumor burden.   Weight loss, non-intentional  Assessment & Plan Patient has lost another 10 pounds since his last weight check.  He states he has minimal to no appetite whatsoever.  He is unable to take in the solid food; but states he has been trying to drink fluids.  Most likely, this is due to progression of patient's disease and probable worsening of his bowel obstruction due to tumor burden.    Nausea with vomiting  Assessment & Plan The patient does have a G-tube.  He states that he has a he has vomited twice at today.  He states that his nausea is relieved when he empties out his G-tube contents.  Patient was given Zofran IV while at the [facility] today.     Constipation  Assessment & Plan Patient has not had a bowel movement in approximately 10 days.  He states he did try to give himself an enema earlier this morning; with only a minimal results.  However, patient has hasn't taken it minimal solid foods.  Most likely, patient is experiencing progression of his disease and probable worsening of his previous bowel obstruction.  Did briefly review stool softeners and laxatives with patient.  It may be difficult for patient to absorb any of these medications that do to tumor burden and blockage.   Hyperphosphatemia  Assessment & Plan Alkaline  phosphatase has increased to 209.  Most likely, this is indicative of progression of disease.   Transaminitis  Assessment & Plan AST is slightly elevated to 35.  ALT is 43.   Hypoalbuminemia  Assessment & Plan Albumin continues to trend downward.  The patient was encouraged to push protein is much as was possible.   Patient stated understanding of all instructions; and was in agreement with this plan of care. The patient knows to call the clinic with any problems, questions or concerns.   Shared visit with Dr. Benay Spice today.  Total time spent with patient was 40 minutes;  with greater than 75 percent of that time spent in face to face counseling regarding patient's symptoms, review of lab results, and discussion of pain management options, hospice discussion, and coordination of care and follow up.  Disclaimer: This note was dictated with voice recognition software. Similar sounding words can inadvertently be transcribed and may not be corrected upon review.   Drue Second, NP 02/17/2014   This was a shared visit with Drue Second. Mr. Hartstein was interviewed and  examined.  He has increased abdominal pain and obstructive symptoms. His symptoms are likely related to progressive carcinomatosis.  The pain was relieved with IV Dilaudid in the office today. He will increase the Duragesic patch to 175 mcg and he will increase the oxycodone to 30-40 mg for breakthrough pain.  I discussed Hospice care with Mr. Villella and his wife. He agrees to a [facility] referral. We discussed CPR and ACLS issues. He would like to discuss this further with his wife prior to making a CODE STATUS decision.  Julieanne Manson, M.D.

## 2014-02-17 NOTE — Telephone Encounter (Signed)
Call from pt requesting to come in for office visit. Reports worsening lower abdominal pain. Last BM 8/8. Denies nausea, reports polydipsia. Gtube capped, drains when he feels "full". Reviewed with Dr. Benay Spice: Bring pt in to see APP. Lab orders entered.

## 2014-02-17 NOTE — Assessment & Plan Note (Signed)
The patient does have a G-tube.  He states that he has a he has vomited twice at today.  He states that his nausea is relieved when he empties out his G-tube contents.  Patient was given Zofran IV while at the [facility] today.

## 2014-02-17 NOTE — Assessment & Plan Note (Signed)
Alkaline phosphatase has increased to 209.  Most likely, this is indicative of progression of disease.

## 2014-02-17 NOTE — Telephone Encounter (Signed)
Called referral to [facility]: Metastatic Carcinoma/ bowel obstruction/ pain. Dr. Benay Spice will be attending for hospice. They will call pt tonight.

## 2014-02-17 NOTE — Patient Instructions (Signed)

## 2014-02-17 NOTE — Assessment & Plan Note (Signed)
Patient has not had a bowel movement in approximately 10 days.  He states he did try to give himself an enema earlier this morning; with only a minimal results.  However, patient has hasn't taken it minimal solid foods.  Most likely, patient is experiencing progression of his disease and probable worsening of his previous bowel obstruction.  Did briefly review stool softeners and laxatives with patient.  It may be difficult for patient to absorb any of these medications that do to tumor burden and blockage.

## 2014-02-18 ENCOUNTER — Telehealth: Payer: Self-pay | Admitting: Nurse Practitioner

## 2014-02-18 ENCOUNTER — Telehealth: Payer: Self-pay | Admitting: *Deleted

## 2014-02-18 NOTE — Telephone Encounter (Signed)
Per 08/18 POF, lft msg on Dr. Gearldine Shown nurse vm req to chck for hospice order ....Marland KitchenMarland KitchenKJ

## 2014-02-18 NOTE — Telephone Encounter (Signed)
Spoke with Hospice RN @ [facility]; per Dr. Benay Spice requested medical director to evaluate pt for home IV for pain management; updated RN on pt status and if they could go out any earlier than 2:30.  Hospice RN states she would check and call office back.

## 2014-02-18 NOTE — Telephone Encounter (Signed)
Almyra Free from [facility] called and reported that 2 RN will be going out to the home @ [address] today to evaluate pt and they will contact Hospice MD regarding next steps.  Also stated if pt/family is agreeable to [facility], there is a bed available today if OK with Dr. Benay Spice.  Per Dr. Benay Spice; OK for [facility] if pt agreeable.

## 2014-02-18 NOTE — Telephone Encounter (Signed)
Pt called states "I'm still having same issue; saw doctor yesterday but my pain is not better"  Reports he had to stop his TPN @ 1am d/t severe pain.  Pt states "I have 175 mcg patch on and have taken 2 ml of the oxycodone and my pain is still 7-8; what do I do?  Per Dr. Benay Spice; instructed pt's wife that office will be calling Hospice for medical director to evaluate pt for home IV drip to help control pain; and MD recommended for pt to take addition 2 ml of oxycodone now.  Wife reports that pt is now vomiting up large amount of dark brown/blackish emesis.  Wife also states that pt doesn't want her to drive him to the ED and that hospice said they will be out @ 2:30 today. Wife states "I don't think he can wait that long"  Wife informed that Dr. Benay Spice will be made aware and will return call asap.  Pt verbalized understanding.

## 2014-02-18 NOTE — Telephone Encounter (Signed)
Received call from Vernie Ammons @ [facility] informing Dr. Benay Spice re:  Pt will be transferred to [facility] this afternoon.  Message left on md's desk for review.

## 2014-03-04 ENCOUNTER — Ambulatory Visit: Payer: Federal, State, Local not specified - PPO | Admitting: Oncology

## 2014-03-04 ENCOUNTER — Encounter: Payer: Self-pay | Admitting: *Deleted

## 2014-03-04 NOTE — Progress Notes (Signed)
Received written notice from [place], [place] [facility], that patient died on Mar 08, 2014 at 06:22 am. POF placed to cancel all future appointments. Message given to MD.

## 2014-04-02 DEATH — deceased

## 2015-12-17 ENCOUNTER — Other Ambulatory Visit: Payer: Self-pay | Admitting: Nurse Practitioner

## 2016-03-07 IMAGING — CT CT ABD-PELV W/ CM
2 of 5 series · 16 of 46 positions shown, 18 images · IV contrast (omnipaque)
Comparison: 09/01/2013

CLINICAL DATA: Several days of abdominal distention, belching, and
abdominal pain. History of colon cancer currently on chemotherapy.

EXAM:
CT ABDOMEN AND PELVIS WITH CONTRAST
TECHNIQUE: Multidetector CT imaging of the abdomen and pelvis was performed
using the standard protocol following bolus administration of
intravenous contrast.
CONTRAST:  50mL OMNIPAQUE IOHEXOL 300 MG/ML SOLN, 100mL OMNIPAQUE
IOHEXOL 300 MG/ML SOLN

[Series 2: rtn a/p with · axial · 0.83mm/px · z∈[-518,-133]mm · 13 of 87 slices shown, 15 images]
[im 5/87  soft-tissue]
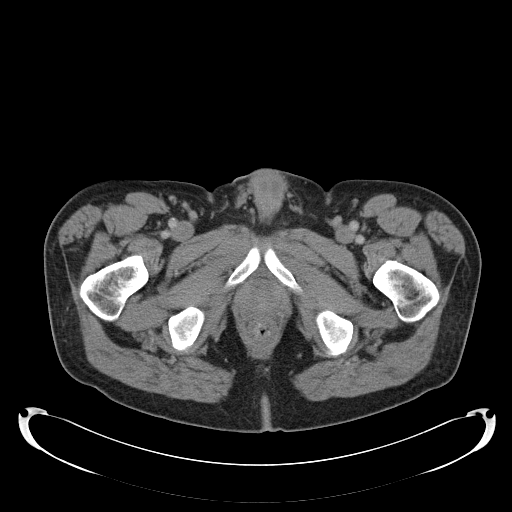
[im 5/87  bone]
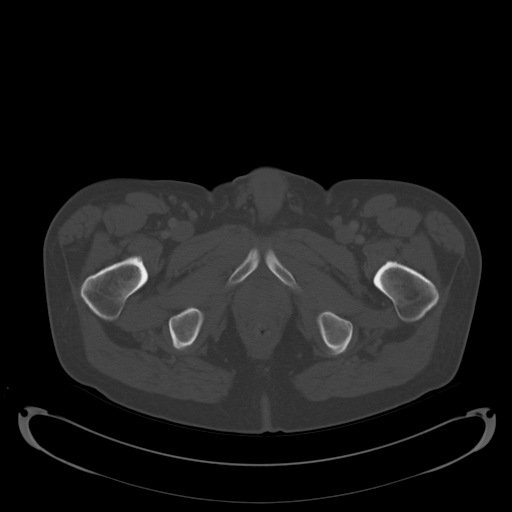
[im 14/87  soft-tissue]
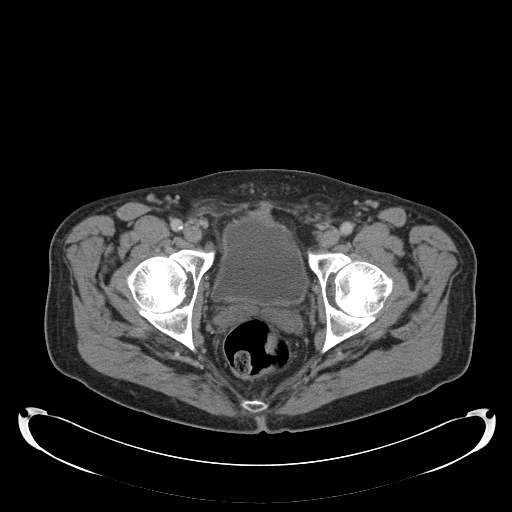
[im 19/87  soft-tissue]
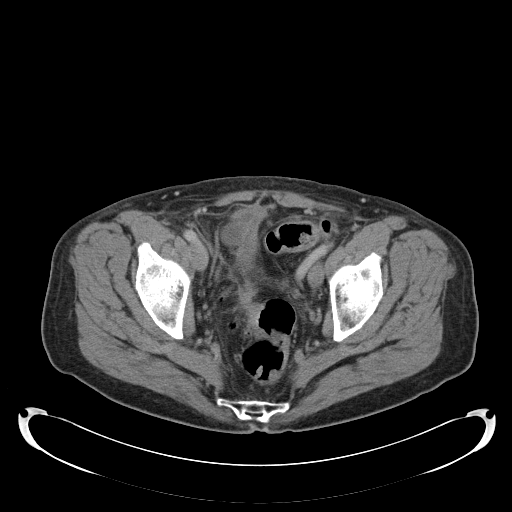
[im 23/87  soft-tissue]
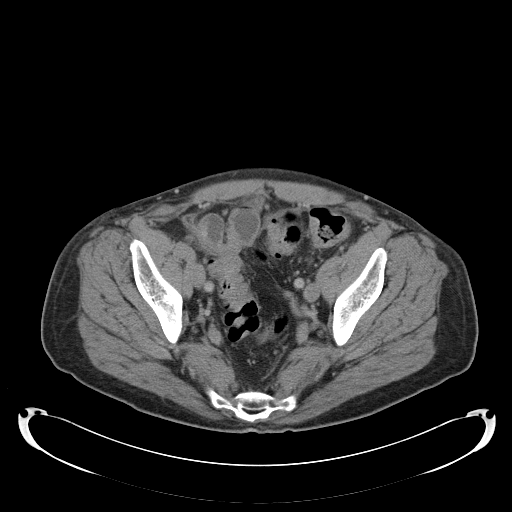
[im 32/87  soft-tissue]
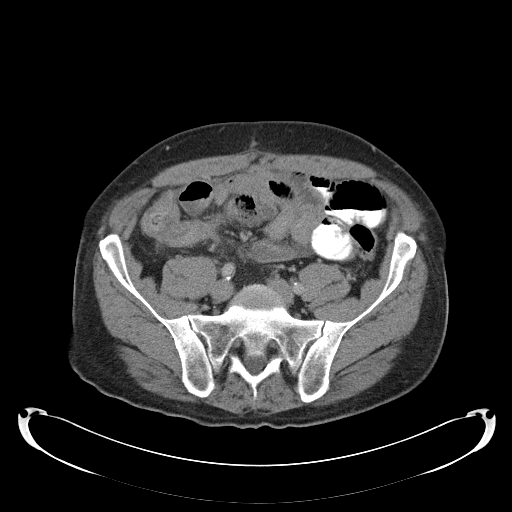
[im 37/87  soft-tissue]
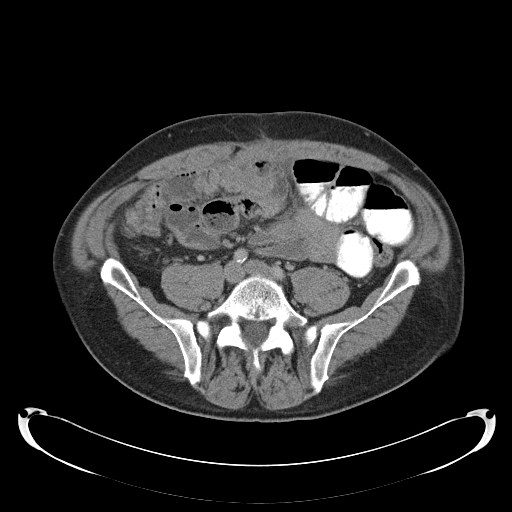
[im 46/87  soft-tissue]
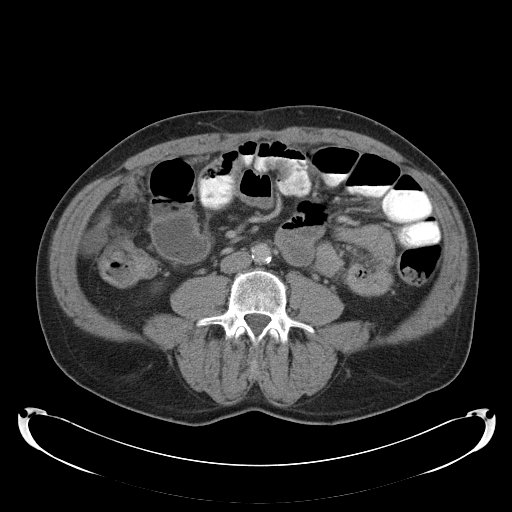
[im 50/87  soft-tissue]
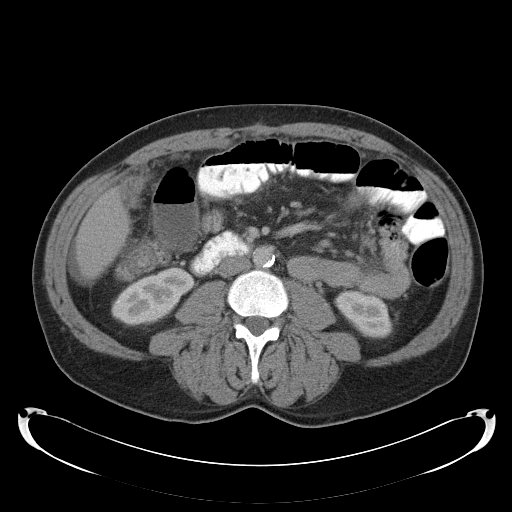
[im 55/87  soft-tissue]
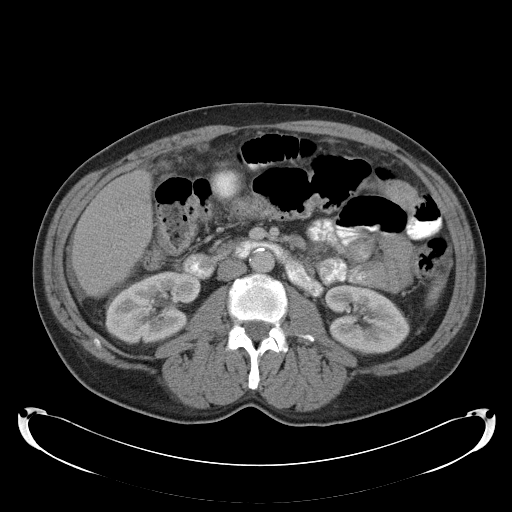
[im 55/87  bone]
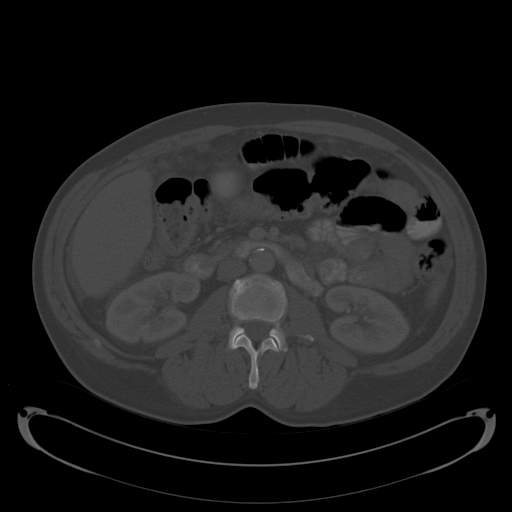
[im 64/87  soft-tissue]
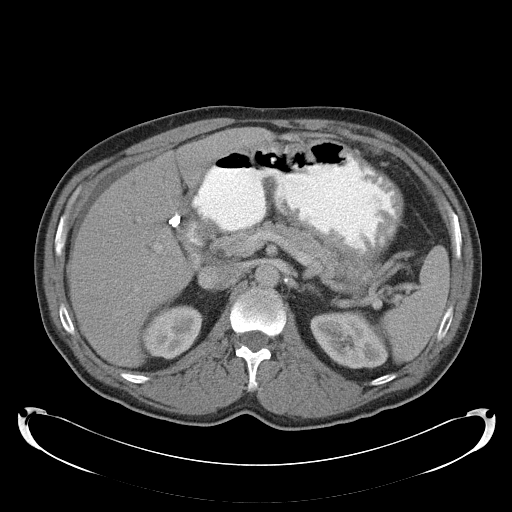
[im 68/87  soft-tissue]
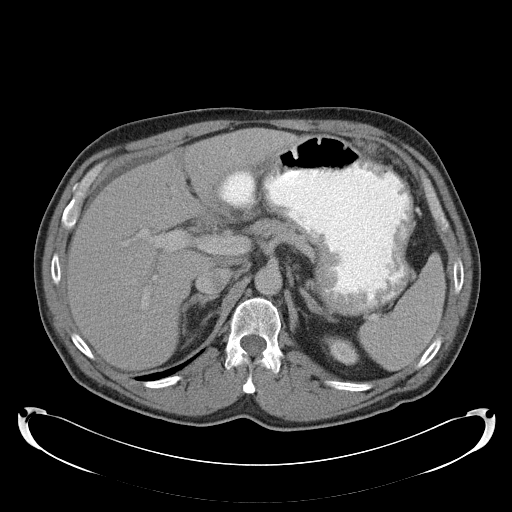
[im 73/87  soft-tissue]
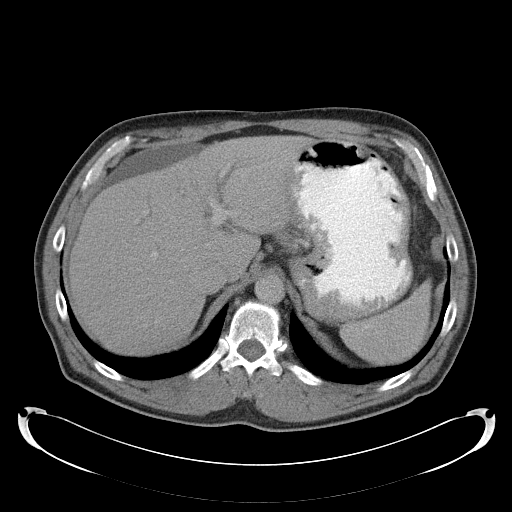
[im 82/87  soft-tissue]
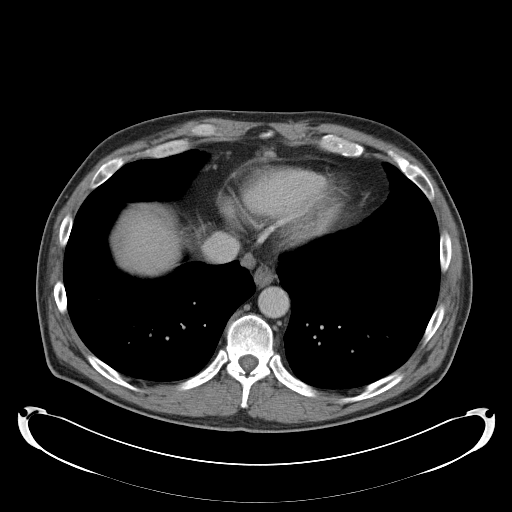

[Series 602: <mpr thick range> · coronal · 0.85mm/px · 3 of 139 slices shown]
[im 47/139  soft-tissue]
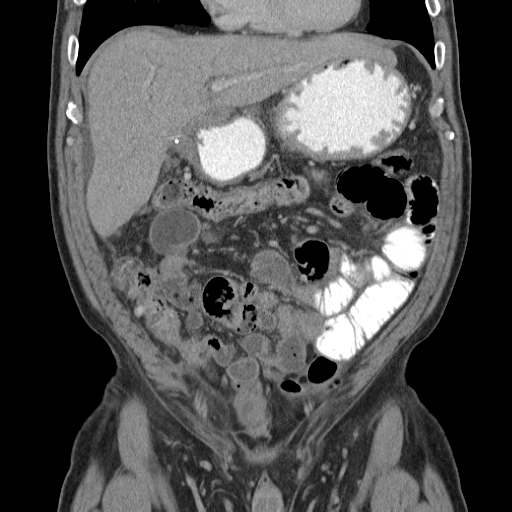
[im 62/139  soft-tissue]
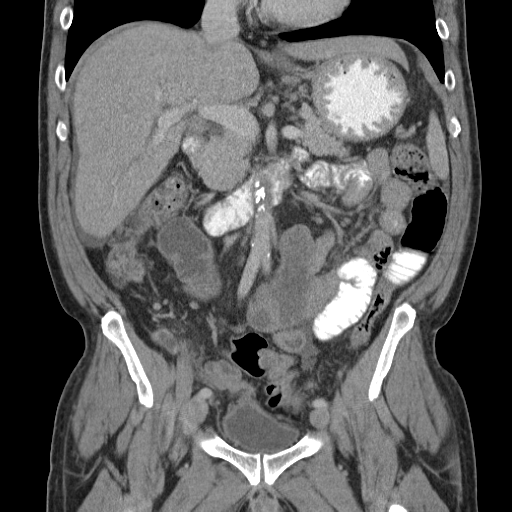
[im 77/139  soft-tissue]
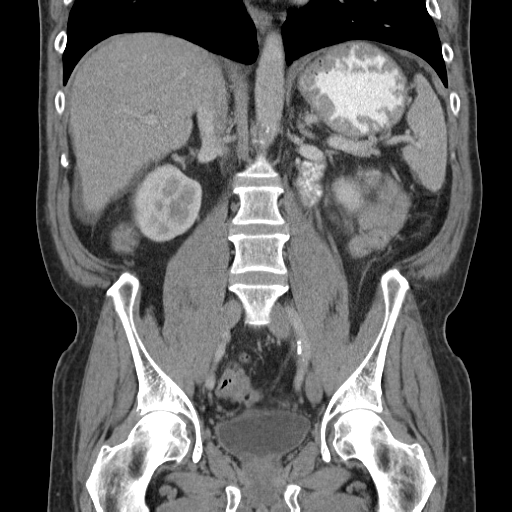

[16 of 46 positions shown; findings below may reference images not displayed]

FINDINGS: Mild dependent changes in the lung bases. Right pericardial lymph
nodes are mildly enlarged, similar to prior study. Distal
paraesophageal lymph nodes are also unchanged.

Free fluid around the liver edge and extending along the right
pericolic gutter. This is similar to prior study. Nodular
infiltrative changes in the omentum and mesentery likely represent
peritoneal carcinomatosis. Mesenteric changes appear slightly more
prominent than on prior study. No focal liver lesions are
identified. Surgical absence of the gallbladder. Mild dilatation of
bile ducts is probably physiologic after cholecystectomy. Spleen
size is normal. No adrenal gland nodules. Kidneys appear symmetrical
without mass or hydronephrosis. Inferior vena cava and abdominal
aorta are normal allowing for vascular calcifications. Pancreas is
unremarkable. Retroperitoneal lymph nodes are not pathologically
enlarged. Mesenteric lymph nodes are numerous without pathologic
enlargement and probably metastatic. No free air in the abdomen.
Nonspecific infiltration in the umbilicus could represent tumor
infiltration oral postoperative scarring. This is unchanged. Gastric
wall is not thickened. Proximal small bowel are mildly prominent
without discrete dilatation. Fluid filled distal small bowel.
Partial obstruction or ileus are not excluded. Appearance is similar
to prior study. Stool-filled colon without distention.

Pelvis: Prostate gland is not enlarged. Bladder wall is not
thickened. Small amount of free fluid in the pelvis. Pelvic lymph
nodes are moderately prominent without pathologic enlargement. This
is stable. Sigmoid colonic wall thickening demonstrated on prior
study is less well visualized today due to degree of distention. No
diverticulitis. Appendix is normal. No destructive or expansile bone
lesions are appreciated.
IMPRESSION: Changes of likely abdominal and pelvic peritoneal carcinomatosis
with small amount of free fluid around the liver and in the pelvis,
numerous nonenlarged mesenteric lymph nodes, infiltration and
nodularity in the omentum and mesenteric, and with infiltration into
the abdominal wall at the umbilicus. Prominent lymph nodes in the
lower chest. Mesenteric infiltration appears to be mildly progressed
since previous study. Borderline prominence of small bowel without
definitive evidence of obstruction. Changes could be due to partial
obstruction or ileus.
